# Patient Record
Sex: Male | Born: 1955 | Race: Black or African American | Hispanic: No | Marital: Single | Smoking: Former smoker
Health system: Southern US, Community
[De-identification: ages and names within clinical notes are randomized; demographics above are authoritative.]

## PROBLEM LIST (undated history)

## (undated) DIAGNOSIS — E162 Hypoglycemia, unspecified: Secondary | ICD-10-CM

## (undated) DIAGNOSIS — Z1635 Resistance to multiple antimicrobial drugs: Secondary | ICD-10-CM

## (undated) DIAGNOSIS — J45909 Unspecified asthma, uncomplicated: Secondary | ICD-10-CM

## (undated) DIAGNOSIS — K912 Postsurgical malabsorption, not elsewhere classified: Secondary | ICD-10-CM

## (undated) DIAGNOSIS — B377 Candidal sepsis: Secondary | ICD-10-CM

## (undated) DIAGNOSIS — G931 Anoxic brain damage, not elsewhere classified: Secondary | ICD-10-CM

## (undated) DIAGNOSIS — I509 Heart failure, unspecified: Secondary | ICD-10-CM

## (undated) DIAGNOSIS — K90829 Short bowel syndrome, unspecified: Secondary | ICD-10-CM

## (undated) DIAGNOSIS — J984 Other disorders of lung: Secondary | ICD-10-CM

## (undated) DIAGNOSIS — L89159 Pressure ulcer of sacral region, unspecified stage: Secondary | ICD-10-CM

## (undated) DIAGNOSIS — I469 Cardiac arrest, cause unspecified: Secondary | ICD-10-CM

## (undated) DIAGNOSIS — K5090 Crohn's disease, unspecified, without complications: Secondary | ICD-10-CM

## (undated) DIAGNOSIS — D649 Anemia, unspecified: Secondary | ICD-10-CM

## (undated) DIAGNOSIS — IMO0002 Reserved for concepts with insufficient information to code with codable children: Secondary | ICD-10-CM

## (undated) DIAGNOSIS — R410 Disorientation, unspecified: Secondary | ICD-10-CM

## (undated) DIAGNOSIS — E46 Unspecified protein-calorie malnutrition: Secondary | ICD-10-CM

## (undated) DIAGNOSIS — Z95810 Presence of automatic (implantable) cardiac defibrillator: Secondary | ICD-10-CM

## (undated) DIAGNOSIS — I82629 Acute embolism and thrombosis of deep veins of unspecified upper extremity: Secondary | ICD-10-CM

## (undated) DIAGNOSIS — G4733 Obstructive sleep apnea (adult) (pediatric): Secondary | ICD-10-CM

---

## 2009-04-11 DIAGNOSIS — G4733 Obstructive sleep apnea (adult) (pediatric): Secondary | ICD-10-CM | POA: Diagnosis present

## 2012-05-25 DIAGNOSIS — E538 Deficiency of other specified B group vitamins: Secondary | ICD-10-CM | POA: Insufficient documentation

## 2012-08-24 DIAGNOSIS — K912 Postsurgical malabsorption, not elsewhere classified: Secondary | ICD-10-CM | POA: Insufficient documentation

## 2012-08-24 DIAGNOSIS — K90829 Short bowel syndrome, unspecified: Secondary | ICD-10-CM | POA: Insufficient documentation

## 2013-06-17 DIAGNOSIS — I509 Heart failure, unspecified: Secondary | ICD-10-CM

## 2014-08-04 DIAGNOSIS — Z95810 Presence of automatic (implantable) cardiac defibrillator: Secondary | ICD-10-CM | POA: Insufficient documentation

## 2015-12-06 DIAGNOSIS — I428 Other cardiomyopathies: Secondary | ICD-10-CM | POA: Insufficient documentation

## 2016-05-17 DIAGNOSIS — D638 Anemia in other chronic diseases classified elsewhere: Secondary | ICD-10-CM | POA: Insufficient documentation

## 2017-02-15 DIAGNOSIS — J9622 Acute and chronic respiratory failure with hypercapnia: Secondary | ICD-10-CM | POA: Insufficient documentation

## 2017-02-18 DIAGNOSIS — B49 Unspecified mycosis: Secondary | ICD-10-CM | POA: Insufficient documentation

## 2017-04-14 DIAGNOSIS — I469 Cardiac arrest, cause unspecified: Secondary | ICD-10-CM | POA: Insufficient documentation

## 2017-04-14 DIAGNOSIS — D62 Acute posthemorrhagic anemia: Secondary | ICD-10-CM | POA: Insufficient documentation

## 2017-05-11 DIAGNOSIS — N179 Acute kidney failure, unspecified: Secondary | ICD-10-CM | POA: Diagnosis present

## 2017-05-22 ENCOUNTER — Inpatient Hospital Stay
Admission: RE | Admit: 2017-05-22 | Discharge: 2017-05-24 | Disposition: A | Discharge status: Hospice | Payer: Medicare Other | Source: Other Acute Inpatient Hospital | Attending: Internal Medicine | Admitting: Internal Medicine

## 2017-05-22 ENCOUNTER — Other Ambulatory Visit (HOSPITAL_COMMUNITY): Payer: Medicare Other

## 2017-05-22 DIAGNOSIS — Z0189 Encounter for other specified special examinations: Secondary | ICD-10-CM

## 2017-05-22 DIAGNOSIS — Z4659 Encounter for fitting and adjustment of other gastrointestinal appliance and device: Secondary | ICD-10-CM

## 2017-05-22 DIAGNOSIS — R0603 Acute respiratory distress: Secondary | ICD-10-CM

## 2017-05-22 DIAGNOSIS — J9690 Respiratory failure, unspecified, unspecified whether with hypoxia or hypercapnia: Secondary | ICD-10-CM

## 2017-05-22 MED ORDER — ONDANSETRON HCL 4 MG/2ML IJ SOLN
4.00 | INTRAMUSCULAR | Status: DC
Start: ? — End: 2017-05-22

## 2017-05-22 MED ORDER — HEPARIN SODIUM (PORCINE) 10000 UNIT/ML IJ SOLN
2500.00 | INTRAMUSCULAR | Status: DC
Start: 2017-05-22 — End: 2017-05-22

## 2017-05-22 MED ORDER — GENERIC EXTERNAL MEDICATION
Status: DC
Start: ? — End: 2017-05-22

## 2017-05-22 MED ORDER — SODIUM CHLORIDE 0.9 % IJ SOLN
10.00 | INTRAMUSCULAR | Status: DC
Start: 2017-05-23 — End: 2017-05-22

## 2017-05-22 MED ORDER — GENERIC EXTERNAL MEDICATION
500.00 | Status: DC
Start: 2017-05-22 — End: 2017-05-22

## 2017-05-22 MED ORDER — GENERIC EXTERNAL MEDICATION
.50 | Status: DC
Start: ? — End: 2017-05-22

## 2017-05-22 MED ORDER — GABAPENTIN 300 MG/6ML PO SOLN
300.00 | ORAL | Status: DC
Start: 2017-05-23 — End: 2017-05-22

## 2017-05-22 MED ORDER — HYDROMORPHONE HCL-NACL 25-0.9 MG/25ML-% IV SOSY
0.50 | PREFILLED_SYRINGE | INTRAVENOUS | Status: DC
Start: ? — End: 2017-05-22

## 2017-05-22 MED ORDER — GENERIC EXTERNAL MEDICATION
1.00 | Status: DC
Start: 2017-05-22 — End: 2017-05-22

## 2017-05-22 MED ORDER — CHLORHEXIDINE GLUCONATE 0.12 % MT SOLN
15.00 | OROMUCOSAL | Status: DC
Start: 2017-05-22 — End: 2017-05-22

## 2017-05-22 MED ORDER — GLUCAGON HCL RDNA (DIAGNOSTIC) 1 MG IJ SOLR
1.00 | INTRAMUSCULAR | Status: DC
Start: ? — End: 2017-05-22

## 2017-05-22 MED ORDER — HYDROMORPHONE HCL-NACL 30-0.9 MG/30ML-% IV SOSY
0.50 | PREFILLED_SYRINGE | INTRAVENOUS | Status: DC
Start: ? — End: 2017-05-22

## 2017-05-22 MED ORDER — FAMOTIDINE PREMIXED 20-0.9 MG/50ML-% IV SOLN
20.00 | INTRAVENOUS | Status: DC
Start: 2017-05-23 — End: 2017-05-22

## 2017-05-22 MED ORDER — QUETIAPINE FUMARATE 25 MG PO TABS
25.00 | ORAL_TABLET | ORAL | Status: DC
Start: 2017-05-22 — End: 2017-05-22

## 2017-05-22 MED ORDER — DEXTROSE 50 % IV SOLN
12.50 | INTRAVENOUS | Status: DC
Start: ? — End: 2017-05-22

## 2017-05-22 MED ORDER — LEVOTHYROXINE SODIUM 500 MCG IV SOLR
22.00 | INTRAVENOUS | Status: DC
Start: 2017-05-23 — End: 2017-05-22

## 2017-05-22 MED ORDER — SODIUM CHLORIDE 0.9 % IV SOLN
INTRAVENOUS | Status: DC
Start: ? — End: 2017-05-22

## 2017-05-22 MED ORDER — SODIUM CHLORIDE 0.9 % IJ SOLN
10.00 | INTRAMUSCULAR | Status: DC
Start: ? — End: 2017-05-22

## 2017-05-22 MED ORDER — DEXTROSE 10 % IV SOLN
INTRAVENOUS | Status: DC
Start: ? — End: 2017-05-22

## 2017-05-22 MED ORDER — BUPROPION HCL 75 MG PO TABS
75.00 | ORAL_TABLET | ORAL | Status: DC
Start: 2017-05-23 — End: 2017-05-22

## 2017-05-23 LAB — COMPREHENSIVE METABOLIC PANEL
ALBUMIN: 3 g/dL — AB (ref 3.5–5.0)
ALT: 43 U/L (ref 17–63)
ANION GAP: 12 (ref 5–15)
AST: 84 U/L — AB (ref 15–41)
Alkaline Phosphatase: 259 U/L — ABNORMAL HIGH (ref 38–126)
BILIRUBIN TOTAL: 1.5 mg/dL — AB (ref 0.3–1.2)
BUN: 36 mg/dL — ABNORMAL HIGH (ref 6–20)
CHLORIDE: 100 mmol/L — AB (ref 101–111)
CO2: 21 mmol/L — AB (ref 22–32)
Calcium: 10.4 mg/dL — ABNORMAL HIGH (ref 8.9–10.3)
Creatinine, Ser: 1.95 mg/dL — ABNORMAL HIGH (ref 0.61–1.24)
GFR calc Af Amer: 41 mL/min — ABNORMAL LOW (ref >60)
GFR calc non Af Amer: 36 mL/min — ABNORMAL LOW (ref >60)
GLUCOSE: 169 mg/dL — AB (ref 65–99)
POTASSIUM: 4.1 mmol/L (ref 3.5–5.1)
SODIUM: 133 mmol/L — AB (ref 135–145)
TOTAL PROTEIN: 8.7 g/dL — AB (ref 6.5–8.1)

## 2017-05-23 LAB — CBC
HEMATOCRIT: 31.5 % — AB (ref 39.0–52.0)
HEMOGLOBIN: 10.1 g/dL — AB (ref 13.0–17.0)
MCH: 27.2 pg (ref 26.0–34.0)
MCHC: 32.1 g/dL (ref 30.0–36.0)
MCV: 84.9 fL (ref 78.0–100.0)
Platelets: 707 10*3/uL — ABNORMAL HIGH (ref 150–400)
RBC: 3.71 MIL/uL — ABNORMAL LOW (ref 4.22–5.81)
RDW: 16.8 % — ABNORMAL HIGH (ref 11.5–15.5)
WBC: 13.4 10*3/uL — ABNORMAL HIGH (ref 4.0–10.5)

## 2017-05-23 LAB — C DIFFICILE QUICK SCREEN W PCR REFLEX
C Diff antigen: NEGATIVE
C Diff interpretation: NOT DETECTED
C Diff toxin: NEGATIVE

## 2017-05-23 LAB — MAGNESIUM: Magnesium: 1.8 mg/dL (ref 1.7–2.4)

## 2017-05-23 LAB — PHOSPHORUS: PHOSPHORUS: 2.6 mg/dL (ref 2.5–4.6)

## 2017-05-23 LAB — TRIGLYCERIDES: Triglycerides: 137 mg/dL (ref <150)

## 2017-05-24 ENCOUNTER — Inpatient Hospital Stay (HOSPITAL_COMMUNITY)
Admission: AD | Admit: 2017-05-24 | Discharge: 2017-05-29 | DRG: 870 | Disposition: A | Discharge status: Other Acute Inpatient Hospital | Payer: Medicare Other | Source: Other Acute Inpatient Hospital | Attending: Internal Medicine | Admitting: Internal Medicine

## 2017-05-24 ENCOUNTER — Inpatient Hospital Stay (HOSPITAL_COMMUNITY): Payer: Medicare Other

## 2017-05-24 ENCOUNTER — Encounter (HOSPITAL_COMMUNITY): Payer: Self-pay | Admitting: Critical Care Medicine

## 2017-05-24 ENCOUNTER — Other Ambulatory Visit (HOSPITAL_COMMUNITY): Payer: Medicare Other

## 2017-05-24 DIAGNOSIS — K567 Ileus, unspecified: Secondary | ICD-10-CM | POA: Diagnosis present

## 2017-05-24 DIAGNOSIS — I5043 Acute on chronic combined systolic (congestive) and diastolic (congestive) heart failure: Secondary | ICD-10-CM | POA: Diagnosis not present

## 2017-05-24 DIAGNOSIS — G4733 Obstructive sleep apnea (adult) (pediatric): Secondary | ICD-10-CM | POA: Diagnosis present

## 2017-05-24 DIAGNOSIS — J984 Other disorders of lung: Secondary | ICD-10-CM | POA: Diagnosis present

## 2017-05-24 DIAGNOSIS — Z681 Body mass index (BMI) 19 or less, adult: Secondary | ICD-10-CM | POA: Diagnosis not present

## 2017-05-24 DIAGNOSIS — A419 Sepsis, unspecified organism: Principal | ICD-10-CM

## 2017-05-24 DIAGNOSIS — R570 Cardiogenic shock: Secondary | ICD-10-CM | POA: Diagnosis present

## 2017-05-24 DIAGNOSIS — Z79890 Hormone replacement therapy (postmenopausal): Secondary | ICD-10-CM

## 2017-05-24 DIAGNOSIS — K5090 Crohn's disease, unspecified, without complications: Secondary | ICD-10-CM | POA: Diagnosis present

## 2017-05-24 DIAGNOSIS — Z515 Encounter for palliative care: Secondary | ICD-10-CM | POA: Diagnosis not present

## 2017-05-24 DIAGNOSIS — N390 Urinary tract infection, site not specified: Secondary | ICD-10-CM | POA: Diagnosis present

## 2017-05-24 DIAGNOSIS — Z9911 Dependence on respirator [ventilator] status: Secondary | ICD-10-CM

## 2017-05-24 DIAGNOSIS — N281 Cyst of kidney, acquired: Secondary | ICD-10-CM | POA: Diagnosis present

## 2017-05-24 DIAGNOSIS — Z86718 Personal history of other venous thrombosis and embolism: Secondary | ICD-10-CM

## 2017-05-24 DIAGNOSIS — N179 Acute kidney failure, unspecified: Secondary | ICD-10-CM

## 2017-05-24 DIAGNOSIS — K912 Postsurgical malabsorption, not elsewhere classified: Secondary | ICD-10-CM | POA: Diagnosis present

## 2017-05-24 DIAGNOSIS — L89154 Pressure ulcer of sacral region, stage 4: Secondary | ICD-10-CM | POA: Diagnosis present

## 2017-05-24 DIAGNOSIS — E872 Acidosis: Secondary | ICD-10-CM | POA: Diagnosis present

## 2017-05-24 DIAGNOSIS — Z888 Allergy status to other drugs, medicaments and biological substances status: Secondary | ICD-10-CM

## 2017-05-24 DIAGNOSIS — R680 Hypothermia, not associated with low environmental temperature: Secondary | ICD-10-CM | POA: Diagnosis not present

## 2017-05-24 DIAGNOSIS — I5022 Chronic systolic (congestive) heart failure: Secondary | ICD-10-CM | POA: Diagnosis present

## 2017-05-24 DIAGNOSIS — Z930 Tracheostomy status: Secondary | ICD-10-CM

## 2017-05-24 DIAGNOSIS — E876 Hypokalemia: Secondary | ICD-10-CM | POA: Diagnosis present

## 2017-05-24 DIAGNOSIS — J9601 Acute respiratory failure with hypoxia: Secondary | ICD-10-CM | POA: Diagnosis not present

## 2017-05-24 DIAGNOSIS — J9620 Acute and chronic respiratory failure, unspecified whether with hypoxia or hypercapnia: Secondary | ICD-10-CM

## 2017-05-24 DIAGNOSIS — R627 Adult failure to thrive: Secondary | ICD-10-CM

## 2017-05-24 DIAGNOSIS — I5030 Unspecified diastolic (congestive) heart failure: Secondary | ICD-10-CM | POA: Diagnosis not present

## 2017-05-24 DIAGNOSIS — E43 Unspecified severe protein-calorie malnutrition: Secondary | ICD-10-CM

## 2017-05-24 DIAGNOSIS — J9621 Acute and chronic respiratory failure with hypoxia: Secondary | ICD-10-CM | POA: Diagnosis present

## 2017-05-24 DIAGNOSIS — I255 Ischemic cardiomyopathy: Secondary | ICD-10-CM | POA: Diagnosis present

## 2017-05-24 DIAGNOSIS — E039 Hypothyroidism, unspecified: Secondary | ICD-10-CM | POA: Diagnosis present

## 2017-05-24 DIAGNOSIS — I959 Hypotension, unspecified: Secondary | ICD-10-CM | POA: Diagnosis not present

## 2017-05-24 DIAGNOSIS — I469 Cardiac arrest, cause unspecified: Secondary | ICD-10-CM | POA: Diagnosis present

## 2017-05-24 DIAGNOSIS — D638 Anemia in other chronic diseases classified elsewhere: Secondary | ICD-10-CM | POA: Diagnosis present

## 2017-05-24 DIAGNOSIS — Z7401 Bed confinement status: Secondary | ICD-10-CM

## 2017-05-24 DIAGNOSIS — Z885 Allergy status to narcotic agent status: Secondary | ICD-10-CM

## 2017-05-24 DIAGNOSIS — N170 Acute kidney failure with tubular necrosis: Secondary | ICD-10-CM | POA: Diagnosis present

## 2017-05-24 DIAGNOSIS — I110 Hypertensive heart disease with heart failure: Secondary | ICD-10-CM | POA: Diagnosis present

## 2017-05-24 DIAGNOSIS — Z881 Allergy status to other antibiotic agents status: Secondary | ICD-10-CM

## 2017-05-24 DIAGNOSIS — Z1624 Resistance to multiple antibiotics: Secondary | ICD-10-CM | POA: Diagnosis present

## 2017-05-24 DIAGNOSIS — Z7189 Other specified counseling: Secondary | ICD-10-CM

## 2017-05-24 DIAGNOSIS — Z880 Allergy status to penicillin: Secondary | ICD-10-CM

## 2017-05-24 DIAGNOSIS — R6521 Severe sepsis with septic shock: Secondary | ICD-10-CM | POA: Diagnosis present

## 2017-05-24 DIAGNOSIS — J9622 Acute and chronic respiratory failure with hypercapnia: Secondary | ICD-10-CM | POA: Diagnosis present

## 2017-05-24 DIAGNOSIS — R64 Cachexia: Secondary | ICD-10-CM | POA: Diagnosis present

## 2017-05-24 DIAGNOSIS — I509 Heart failure, unspecified: Secondary | ICD-10-CM | POA: Diagnosis not present

## 2017-05-24 DIAGNOSIS — Z22322 Carrier or suspected carrier of Methicillin resistant Staphylococcus aureus: Secondary | ICD-10-CM

## 2017-05-24 DIAGNOSIS — L8990 Pressure ulcer of unspecified site, unspecified stage: Secondary | ICD-10-CM

## 2017-05-24 DIAGNOSIS — Z95810 Presence of automatic (implantable) cardiac defibrillator: Secondary | ICD-10-CM

## 2017-05-24 DIAGNOSIS — Z8674 Personal history of sudden cardiac arrest: Secondary | ICD-10-CM

## 2017-05-24 DIAGNOSIS — Z9049 Acquired absence of other specified parts of digestive tract: Secondary | ICD-10-CM

## 2017-05-24 HISTORY — DX: Obstructive sleep apnea (adult) (pediatric): G47.33

## 2017-05-24 HISTORY — DX: Anoxic brain damage, not elsewhere classified: G93.1

## 2017-05-24 HISTORY — DX: Reserved for concepts with insufficient information to code with codable children: IMO0002

## 2017-05-24 HISTORY — DX: Unspecified asthma, uncomplicated: J45.909

## 2017-05-24 HISTORY — DX: Acute embolism and thrombosis of deep veins of unspecified upper extremity: I82.629

## 2017-05-24 HISTORY — DX: Cardiac arrest, cause unspecified: I46.9

## 2017-05-24 HISTORY — DX: Hypoglycemia, unspecified: E16.2

## 2017-05-24 HISTORY — DX: Candidal sepsis: B37.7

## 2017-05-24 HISTORY — DX: Short bowel syndrome, unspecified: K90.829

## 2017-05-24 HISTORY — DX: Unspecified protein-calorie malnutrition: E46

## 2017-05-24 HISTORY — DX: Disorientation, unspecified: R41.0

## 2017-05-24 HISTORY — DX: Resistance to multiple antimicrobial drugs: Z16.35

## 2017-05-24 HISTORY — DX: Postsurgical malabsorption, not elsewhere classified: K91.2

## 2017-05-24 HISTORY — DX: Anemia, unspecified: D64.9

## 2017-05-24 HISTORY — DX: Other disorders of lung: J98.4

## 2017-05-24 HISTORY — DX: Presence of automatic (implantable) cardiac defibrillator: Z95.810

## 2017-05-24 HISTORY — DX: Crohn's disease, unspecified, without complications: K50.90

## 2017-05-24 HISTORY — DX: Heart failure, unspecified: I50.9

## 2017-05-24 HISTORY — DX: Pressure ulcer of sacral region, unspecified stage: L89.159

## 2017-05-24 LAB — BLOOD GAS, ARTERIAL
ACID-BASE DEFICIT: 9.8 mmol/L — AB (ref 0.0–2.0)
Acid-base deficit: 17 mmol/L — ABNORMAL HIGH (ref 0.0–2.0)
Bicarbonate: 11.7 mmol/L — ABNORMAL LOW (ref 20.0–28.0)
Bicarbonate: 17.1 mmol/L — ABNORMAL LOW (ref 20.0–28.0)
FIO2: 1
FIO2: 40
LHR: 22 {breaths}/min
MECHVT: 300 mL
MECHVT: 400 mL
O2 SAT: 99.1 %
O2 SAT: 98.3 %
PATIENT TEMPERATURE: 98.6
PATIENT TEMPERATURE: 92
PCO2 ART: 46 mmHg (ref 32.0–48.0)
PEEP/CPAP: 5 cmH2O
PEEP: 5 cmH2O
PH ART: 7.033 — AB (ref 7.350–7.450)
PH ART: 7.227 — AB (ref 7.350–7.450)
PO2 ART: 141 mmHg — AB (ref 83.0–108.0)
RATE: 12 resp/min
pCO2 arterial: 40 mmHg (ref 32.0–48.0)
pO2, Arterial: 426 mmHg — ABNORMAL HIGH (ref 83.0–108.0)

## 2017-05-24 LAB — GLUCOSE, CAPILLARY
GLUCOSE-CAPILLARY: 154 mg/dL — AB (ref 65–99)
GLUCOSE-CAPILLARY: 211 mg/dL — AB (ref 65–99)
Glucose-Capillary: 112 mg/dL — ABNORMAL HIGH (ref 65–99)
Glucose-Capillary: 212 mg/dL — ABNORMAL HIGH (ref 65–99)
Glucose-Capillary: 215 mg/dL — ABNORMAL HIGH (ref 65–99)

## 2017-05-24 LAB — MRSA PCR SCREENING: MRSA by PCR: POSITIVE — AB

## 2017-05-24 LAB — POCT I-STAT 3, ART BLOOD GAS (G3+)
ACID-BASE DEFICIT: 7 mmol/L — AB (ref 0.0–2.0)
BICARBONATE: 21.2 mmol/L (ref 20.0–28.0)
O2 Saturation: 99 %
PH ART: 7.215 — AB (ref 7.350–7.450)
Patient temperature: 37.8
TCO2: 23 mmol/L (ref 22–32)
pCO2 arterial: 52.9 mmHg — ABNORMAL HIGH (ref 32.0–48.0)
pO2, Arterial: 146 mmHg — ABNORMAL HIGH (ref 83.0–108.0)

## 2017-05-24 LAB — MAGNESIUM: MAGNESIUM: 2.4 mg/dL (ref 1.7–2.4)

## 2017-05-24 LAB — ABO/RH: ABO/RH(D): B POS

## 2017-05-24 LAB — COMPREHENSIVE METABOLIC PANEL
ALBUMIN: 3 g/dL — AB (ref 3.5–5.0)
ALK PHOS: 283 U/L — AB (ref 38–126)
ALT: 63 U/L (ref 17–63)
AST: 128 U/L — ABNORMAL HIGH (ref 15–41)
Anion gap: 20 — ABNORMAL HIGH (ref 5–15)
BUN: 49 mg/dL — ABNORMAL HIGH (ref 6–20)
CO2: 11 mmol/L — AB (ref 22–32)
CREATININE: 2.79 mg/dL — AB (ref 0.61–1.24)
Calcium: 9.6 mg/dL (ref 8.9–10.3)
Chloride: 101 mmol/L (ref 101–111)
GFR calc non Af Amer: 23 mL/min — ABNORMAL LOW (ref >60)
GFR, EST AFRICAN AMERICAN: 27 mL/min — AB (ref >60)
GLUCOSE: 136 mg/dL — AB (ref 65–99)
Potassium: 4 mmol/L (ref 3.5–5.1)
SODIUM: 132 mmol/L — AB (ref 135–145)
Total Bilirubin: 1.3 mg/dL — ABNORMAL HIGH (ref 0.3–1.2)
Total Protein: 9 g/dL — ABNORMAL HIGH (ref 6.5–8.1)

## 2017-05-24 LAB — LACTIC ACID, PLASMA
LACTIC ACID, VENOUS: 6.2 mmol/L — AB (ref 0.5–1.9)
LACTIC ACID, VENOUS: 1.3 mmol/L (ref 0.5–1.9)
LACTIC ACID, VENOUS: 1.4 mmol/L (ref 0.5–1.9)

## 2017-05-24 LAB — CBC
HCT: 34.4 % — ABNORMAL LOW (ref 39.0–52.0)
Hemoglobin: 10.6 g/dL — ABNORMAL LOW (ref 13.0–17.0)
MCH: 27.1 pg (ref 26.0–34.0)
MCHC: 30.8 g/dL (ref 30.0–36.0)
MCV: 88 fL (ref 78.0–100.0)
PLATELETS: 665 10*3/uL — AB (ref 150–400)
RBC: 3.91 MIL/uL — AB (ref 4.22–5.81)
RDW: 17.3 % — ABNORMAL HIGH (ref 11.5–15.5)
WBC: 32 10*3/uL — ABNORMAL HIGH (ref 4.0–10.5)

## 2017-05-24 LAB — TYPE AND SCREEN
ABO/RH(D): B POS
ANTIBODY SCREEN: NEGATIVE

## 2017-05-24 LAB — URINALYSIS, ROUTINE W REFLEX MICROSCOPIC
Bilirubin Urine: NEGATIVE
Glucose, UA: 150 mg/dL — AB
Ketones, ur: 5 mg/dL — AB
NITRITE: NEGATIVE
Specific Gravity, Urine: 1.019 (ref 1.005–1.030)
pH: 5 (ref 5.0–8.0)

## 2017-05-24 LAB — CORTISOL: Cortisol, Plasma: 33.9 ug/dL

## 2017-05-24 LAB — TRIGLYCERIDES: Triglycerides: 87 mg/dL (ref <150)

## 2017-05-24 LAB — HIV ANTIBODY (ROUTINE TESTING W REFLEX): HIV Screen 4th Generation wRfx: NONREACTIVE

## 2017-05-24 LAB — CREATININE, URINE, RANDOM: Creatinine, Urine: 121.14 mg/dL

## 2017-05-24 LAB — PHOSPHORUS: Phosphorus: 9.8 mg/dL — ABNORMAL HIGH (ref 2.5–4.6)

## 2017-05-24 LAB — APTT: aPTT: 72 seconds — ABNORMAL HIGH (ref 24–36)

## 2017-05-24 LAB — PROTIME-INR
INR: 1.98
Prothrombin Time: 22.3 seconds — ABNORMAL HIGH (ref 11.4–15.2)

## 2017-05-24 LAB — SODIUM, URINE, RANDOM: SODIUM UR: 26 mmol/L

## 2017-05-24 LAB — PROCALCITONIN: Procalcitonin: 0.89 ng/mL

## 2017-05-24 LAB — TROPONIN I: Troponin I: <0.03 ng/mL (ref <0.03)

## 2017-05-24 MED ORDER — FENTANYL CITRATE (PF) 100 MCG/2ML IJ SOLN
50.0000 ug | INTRAMUSCULAR | Status: DC | PRN
  Administered 2017-05-25 – 2017-05-29 (×10): 50 ug via INTRAVENOUS
  Filled 2017-05-24 (×10): qty 2

## 2017-05-24 MED ORDER — FENTANYL CITRATE (PF) 100 MCG/2ML IJ SOLN: 50.0000 ug | INTRAMUSCULAR | Status: DC | PRN

## 2017-05-24 MED ORDER — STERILE WATER FOR INJECTION IV SOLN: INTRAVENOUS | Status: DC

## 2017-05-24 MED ORDER — SODIUM CHLORIDE 0.9 % IV BOLUS (SEPSIS)
500.0000 mL | Freq: Once | INTRAVENOUS | Status: AC
  Administered 2017-05-24: 500 mL via INTRAVENOUS

## 2017-05-24 MED ORDER — MIDAZOLAM HCL 2 MG/2ML IJ SOLN: 1.0000 mg | INTRAMUSCULAR | Status: DC | PRN

## 2017-05-24 MED ORDER — CHLORHEXIDINE GLUCONATE CLOTH 2 % EX PADS
6.0000 | MEDICATED_PAD | Freq: Every day | CUTANEOUS | Status: DC
  Administered 2017-05-26 – 2017-05-29 (×4): 6 via TOPICAL

## 2017-05-24 MED ORDER — NOREPINEPHRINE BITARTRATE 1 MG/ML IV SOLN
0.0000 ug/min | INTRAVENOUS | Status: DC
  Administered 2017-05-24: 33 ug/min via INTRAVENOUS
  Administered 2017-05-24: 40 ug/min via INTRAVENOUS
  Administered 2017-05-25: 12 ug/min via INTRAVENOUS
  Administered 2017-05-25: 25 ug/min via INTRAVENOUS
  Filled 2017-05-24 (×7): qty 16

## 2017-05-24 MED ORDER — NOREPINEPHRINE BITARTRATE 1 MG/ML IV SOLN
0.0000 ug/min | INTRAVENOUS | Status: DC
  Administered 2017-05-24 (×2): 40 ug/min via INTRAVENOUS
  Administered 2017-05-24: 35 ug/min via INTRAVENOUS
  Filled 2017-05-24 (×3): qty 4

## 2017-05-24 MED ORDER — SODIUM CHLORIDE 0.9 % IV SOLN
500.0000 mg | Freq: Two times a day (BID) | INTRAVENOUS | Status: DC
  Administered 2017-05-24 – 2017-05-27 (×8): 500 mg via INTRAVENOUS
  Filled 2017-05-24 (×12): qty 0.5

## 2017-05-24 MED ORDER — HEPARIN SODIUM (PORCINE) 5000 UNIT/ML IJ SOLN
5000.0000 [IU] | Freq: Three times a day (TID) | INTRAMUSCULAR | Status: DC
  Administered 2017-05-24 – 2017-05-29 (×17): 5000 [IU] via SUBCUTANEOUS
  Filled 2017-05-24 (×18): qty 1

## 2017-05-24 MED ORDER — FAMOTIDINE 40 MG/5ML PO SUSR
20.0000 mg | Freq: Two times a day (BID) | ORAL | Status: DC
  Administered 2017-05-24: 20 mg
  Filled 2017-05-24: qty 2.5

## 2017-05-24 MED ORDER — INSULIN ASPART 100 UNIT/ML ~~LOC~~ SOLN
1.0000 [IU] | SUBCUTANEOUS | Status: DC
  Administered 2017-05-24 (×3): 2 [IU] via SUBCUTANEOUS
  Administered 2017-05-24: 3 [IU] via SUBCUTANEOUS
  Administered 2017-05-25 (×3): 2 [IU] via SUBCUTANEOUS
  Administered 2017-05-25 – 2017-05-28 (×3): 1 [IU] via SUBCUTANEOUS

## 2017-05-24 MED ORDER — SODIUM CHLORIDE 0.9 % IV SOLN
200.0000 mg | Freq: Once | INTRAVENOUS | Status: AC
  Administered 2017-05-24: 200 mg via INTRAVENOUS
  Filled 2017-05-24: qty 200

## 2017-05-24 MED ORDER — MUPIROCIN 2 % EX OINT
1.0000 "application " | TOPICAL_OINTMENT | Freq: Two times a day (BID) | CUTANEOUS | Status: AC
  Administered 2017-05-24 – 2017-05-28 (×10): 1 via NASAL
  Filled 2017-05-24: qty 22

## 2017-05-24 MED ORDER — SODIUM CHLORIDE 0.9 % IV SOLN
250.0000 mL | INTRAVENOUS | Status: DC | PRN
  Administered 2017-05-29: 10 mL via INTRAVENOUS

## 2017-05-24 MED ORDER — SODIUM BICARBONATE 8.4 % IV SOLN
100.0000 meq | Freq: Once | INTRAVENOUS | Status: AC
  Administered 2017-05-24: 100 meq via INTRAVENOUS
  Filled 2017-05-24: qty 100

## 2017-05-24 MED ORDER — SODIUM BICARBONATE 8.4 % IV SOLN
INTRAVENOUS | Status: DC
  Administered 2017-05-24 – 2017-05-25 (×4): via INTRAVENOUS
  Filled 2017-05-24 (×5): qty 150

## 2017-05-24 MED ORDER — SODIUM CHLORIDE 0.9 % IV BOLUS
1000.0000 mL | Freq: Once | INTRAVENOUS | Status: AC
  Administered 2017-05-24: 1000 mL via INTRAVENOUS

## 2017-05-24 MED ORDER — ORAL CARE MOUTH RINSE
15.0000 mL | Freq: Four times a day (QID) | OROMUCOSAL | Status: DC
  Administered 2017-05-24 – 2017-05-29 (×21): 15 mL via OROMUCOSAL

## 2017-05-24 MED ORDER — FAMOTIDINE 40 MG/5ML PO SUSR
20.0000 mg | Freq: Every day | ORAL | Status: DC
  Administered 2017-05-25 – 2017-05-28 (×4): 20 mg
  Filled 2017-05-24 (×5): qty 2.5

## 2017-05-24 MED ORDER — PANTOPRAZOLE SODIUM 40 MG IV SOLR: 40.0000 mg | Freq: Every day | INTRAVENOUS | Status: DC

## 2017-05-24 MED ORDER — IPRATROPIUM-ALBUTEROL 0.5-2.5 (3) MG/3ML IN SOLN
3.0000 mL | Freq: Four times a day (QID) | RESPIRATORY_TRACT | Status: DC
  Administered 2017-05-24 – 2017-05-26 (×9): 3 mL via RESPIRATORY_TRACT
  Filled 2017-05-24 (×10): qty 3

## 2017-05-24 MED ORDER — CHLORHEXIDINE GLUCONATE 0.12% ORAL RINSE (MEDLINE KIT)
15.0000 mL | Freq: Two times a day (BID) | OROMUCOSAL | Status: DC
  Administered 2017-05-24 – 2017-05-29 (×11): 15 mL via OROMUCOSAL

## 2017-05-24 MED ORDER — SODIUM CHLORIDE 0.9 % IV SOLN
100.0000 mg | INTRAVENOUS | Status: DC
  Administered 2017-05-25 – 2017-05-29 (×5): 100 mg via INTRAVENOUS
  Filled 2017-05-24 (×5): qty 100

## 2017-05-24 MED ORDER — VANCOMYCIN HCL IN DEXTROSE 1-5 GM/200ML-% IV SOLN
1000.0000 mg | Freq: Once | INTRAVENOUS | Status: AC
  Administered 2017-05-24: 1000 mg via INTRAVENOUS
  Filled 2017-05-24: qty 200

## 2017-05-24 MED FILL — Sodium Chloride IV Soln 0.9%: INTRAVENOUS | Qty: 1000 | Status: AC

## 2017-05-24 NOTE — Consult Note (Signed)
Consultation Note Date: 05/24/2017   Patient Name: Melvin Kelley  DOB: Dec 03, 1955  MRN: [id number]  Age / Sex: 62 y.o., male  PCP: Cleotis Nipper, MD Referring Physician: Reyne Dumas, MD  Reason for Consultation: Establishing goals of care and Psychosocial/spiritual support  HPI/Patient Profile: 62 y.o. 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  Recently transitioned from [facility] to [facility].  On 4/7 patient found pulseless at [facility]. Started CPR and given 1 EPI with return of ROSC. Transferred to [facility] management.  Continued physical, functional, and cognitive decline over the past many months.  Prognosis is grim for meaningful recovery.  Family face ongoing treatment option decisions, advanced directive decisions and anticipatory care needs.   Clinical Assessment and Goals of Care:  This NP Wadie Lessen reviewed medical records, received report from team, assessed the patient and then meet with the patient's family to include mother, sister/Maryln Nyoka Cowden and her husband Richardson Landry, brother Yohan Samons, daughter Darrelle Wiberg and patient's ex-wife/Bonita   to discuss diagnosis, prognosis, GOC, EOL wishes disposition and options.  Concept of  Palliative Care was discussed  This is a complicated situation.  Per the family the patient has had medical complexity since he was a young man 2/2 to his Crohn's disease.  They describe a significant continued physical and functional decline over the last year.   Patient has been in and out of hospitals and LTAC's over the last many  months.  They tell me about hospital stays at [facility],  [place] and [facility] in [place].  Apparently the patient was transitioned from [facility] in [place] to [place] this past Friday secondary to dissatisfaction with care according to family.  This situation is complicated in that there is no documented healthcare power of attorney.    Up until this point the patient's mother and his 2 siblings Leda Gauze and Richardson Landry have been the main decision makers.  They have opted always for full aggressive care secondary to the fact that the patient had  communicated to them that he wanted "everything done to prolong life at all cost".  It is now complicated by the fact that the patient has 4 children who wish to weigh in on decisions for this patient.  It is unsure at this time how majority feel about level/aggressivenes of care.   A detailed discussion was had today regarding advanced directives.  Concepts specific to code status, artifical feeding and hydration, continued IV antibiotics and rehospitalization was had.  The difference between a aggressive medical intervention path  and a palliative comfort care path for this patient at this time was had.  Values and goals of care important to patient and family were attempted to be elicited.  We discussed the concept of mortality and the limitations of medical interventions available to  offer any opportunity for meaningful recovery.  Discussed high risk for decompensation  Questions and concerns addressed.     Meeting is scheduled for tomorrow at 1100.  Mother and patient's children will be present to clarify GOCs  PMT will continue to support holistically.  Family encouraged to call with questions or concerns.   SUMMARY OF RECOMMENDATIONS    Code Status/Advance Care Planning:  Full code  Additional Recommendations (Limitations, Scope, Preferences):  Full Scope Treatment  Psycho-social/Spiritual:   Desire for further Chaplaincy support:yes  Additional  Recommendations: Grief/Bereavement Support  Prognosis:   Unable to determine, dependent on desire for life prolonging measures  Discharge Planning: To Be Determined      Primary Diagnoses: Present on Admission: . Cardiac arrest ([place])   I have reviewed the medical record, interviewed the patient and family, and examined the patient. The following aspects are pertinent.  Past Medical History:  Diagnosis Date  . AICD (automatic cardioverter/defibrillator) present   . Anemia   . Anoxic encephalopathy ([place])   . Asthma   . Candidemia ([place])   . Cardiac arrest [facility])    Multiple cardiac arrests at [facility] 05/11/17, 03/2017  . CHF (congestive heart failure) (H[facility])    EF 30%  . Crohn disease ([place])   . Delirium   . DVT of upper extremity (deep vein thrombosis) ([place])   . Hypoglycemia   . Malnutrition ([place])    on long term TPN  . MDRO (multiple drug resistant organisms) resistance    Klebsiella and Citrobacter in Urine  . OSA (obstructive sleep apnea)   . Restrictive lung disease   . Sacral decubitus ulcer   . Short gut syndrome    Social History   Socioeconomic History  . Marital status: Single    Spouse name: Not on file  . Number of children: Not on file  . Years of education: Not on file  . Highest education level: Not on file  Occupational History  . Not on file  Social Needs  . Financial resource strain: Not on file  . Food insecurity:    Worry: Not on file    Inability: Not on file  . Transportation needs:    Medical: Not on file    Non-medical: Not on file  Tobacco Use  . Smoking status: Not on file  Substance and Sexual Activity  . Alcohol use: Not on file  . Drug use: Not on file  . Sexual activity: Not on file  Lifestyle  . Physical activity:    Days per week: Not on file    Minutes per session: Not on file  . Stress: Not on file  Relationships  . Social connections:    Talks on phone: Not on file    Gets together: Not on file    Attends religious  service: Not on file    Active member of club or organization: Not on file    Attends meetings of clubs or organizations: Not on file    Relationship status: Not on file  Other Topics Concern  . Not on file  Social History Narrative  . Not on file   No family history on file. Scheduled Meds: . chlorhexidine gluconate (MEDLINE KIT)  15 mL Mouth Rinse BID  . [START ON 05/25/2017] famotidine  20 mg Per Tube QHS  . heparin  5,000 Units Subcutaneous Q8H  . insulin aspart  1-3 Units Subcutaneous Q4H  . ipratropium-albuterol  3 mL Nebulization Q6H  . mouth rinse  15  mL Mouth Rinse QID   Continuous Infusions: . sodium chloride    . [START ON 05/25/2017] anidulafungin    . meropenem (MERREM) IV    . norepinephrine (LEVOPHED) Adult infusion 40 mcg/min (05/24/17 1109)  .  sodium bicarbonate  infusion 1000 mL 125 mL/hr at 05/24/17 0622   PRN Meds:.sodium chloride, fentaNYL (SUBLIMAZE) injection, fentaNYL (SUBLIMAZE) injection, midazolam, midazolam Medications Prior to Admission:  Prior to Admission medications   Not on File   Allergies  Allergen Reactions  . Morphine And Related Shortness Of Breath  . Glipizide Itching  . Levofloxacin Hives  . Penicillins Hives  . Sulfa Antibiotics Hives  . Trazodone And Nefazodone Itching  . Codeine Rash   Review of Systems  Unable to perform ROS: Intubated    Physical Exam  Constitutional: He appears cachectic. He appears ill. He is intubated.  Cardiovascular: Normal rate, regular rhythm and normal heart sounds.  Pulmonary/Chest: He is intubated.  Musculoskeletal:  -Contractures bilateral extremities, gross muscle atrophy  Skin: Skin is warm and dry.    Vital Signs: BP (!) 60/46   Pulse 60   Temp 98 F (36.7 C) (Oral)   Resp 20   Ht 5' 6"  (1.676 m)   Wt 46 kg (101 lb 6.6 oz)   SpO2 100%   BMI 16.37 kg/m  Pain Scale: CPOT       SpO2: SpO2: 100 % O2 Device:SpO2: 100 % O2 Flow Rate: .   IO: Intake/output summary:    Intake/Output Summary (Last 24 hours) at 05/24/2017 1150 Last data filed at 05/24/2017 1000 Gross per 24 hour  Intake 919.5 ml  Output 10 ml  Net 909.5 ml    LBM:   Baseline Weight: Weight: 46 kg (101 lb 6.6 oz) Most recent weight: Weight: 46 kg (101 lb 6.6 oz)      Palliative Assessment/Data:   Discussed with David/bedside RN  Time In: 1230 Time Out: 1345 Time Total: 75 min Greater than 50%  of this time was spent counseling and coordinating care related to the above assessment and plan.  Signed by: Wadie Lessen, NP   Please contact Palliative Medicine Team phone at [phone] for questions and concerns.  For individual provider: See Shea Evans

## 2017-05-24 NOTE — Progress Notes (Addendum)
PCCM interval note  ABG reviewed 7.22/53/146/99% Labs significant for lactic acid 6.2 > 1.3 pro calcitonin 0.9, troponin less than 0.03, cortisol 34  - Respiratory rate on vent increased to 28 - Continue antibiotics - Wean off Levophed as tolerated  Chilton Greathouse MD [facility] Pager [phone] If no answer or after 3pm call: [phone] 05/24/2017, 2:12 PM

## 2017-05-24 NOTE — Progress Notes (Addendum)
Pharmacy Antibiotic Note  Zidan Mackowiak is a 62 y.o. male admitted on 05/24/2017 with sepsis after coding at [facility].  Pharmacy has been consulted for Vancocin, Merrem, and Eraxis dosing.  Pt had been on Merrem at [facility] (last dose at [facility]).  Plan: Vancomycin 1g IV x1 and monitor SCr prior to redosing (inc'd sharply over last 12hr 1.95>2.79).  Goal trough 15-20 mcg/mL. Merrem 500mg  IV Q12H (was previously dose at [facility]). Eraxis 200mg  x1 then 100mg  IV Q24H.  Height: 5\' 6"  (167.6 cm) Weight: 101 lb 6.6 oz (46 kg) IBW/kg (Calculated) : 63.8  Recent Labs  Lab 05/23/17 1344 05/24/17 0435  WBC 13.4* 32.0*  CREATININE 1.95* 2.79*  LATICACIDVEN  --  6.2*    Estimated Creatinine Clearance: 18.3 mL/min (A) (by C-G formula based on SCr of 2.79 mg/dL (H)).    Allergies  Allergen Reactions  . Morphine And Related Shortness Of Breath  . Glipizide Itching  . Levofloxacin Hives  . Penicillins Hives  . Sulfa Antibiotics Hives  . Trazodone And Nefazodone Itching  . Codeine Rash     Thank you for allowing pharmacy to be a part of this patient's care.  Vernard Gambles, PharmD, BCPS  05/24/2017 5:45 AM

## 2017-05-24 NOTE — Code Documentation (Signed)
  Patient Name: Melvin Kelley   MRN: [id number]   Date of Birth/ Sex: 12/06/1955 , male      Admission Date: 05/24/2017  Attending Provider: Gigi Gin, MD  Primary Diagnosis: <principal problem not specified>   Indication: Pt was in his usual state of health until this PM, when he was noted to be unresponsive and in PEA arrest. Code blue was subsequently called. At the time of arrival on scene, ACLS protocol was underway.   Technical Description:  - CPR performance duration:  2 minutes  - Was defibrillation or cardioversion used? No   - Was external pacer placed? No  - Was patient intubated pre/post CPR? Yes   Medications Administered: Y = Yes; Blank = No Amiodarone    Atropine  Y  Calcium    Epinephrine  Y  Lidocaine    Magnesium    Norepinephrine    Phenylephrine    Sodium bicarbonate    Vasopressin     Post CPR evaluation:  - Final Status - Was patient successfully resuscitated ? Yes - What is current rhythm? Sinus bradycardia - What is current hemodynamic status? Stable  Miscellaneous Information:  - Labs sent, including: Per PCCM  - Primary team notified?  Yes  - Family Notified? Unsure  - Additional notes/ transfer status: Transferred to ICU     Scherrie Gerlach, MD  05/24/2017, 6:46 AM

## 2017-05-24 NOTE — H&P (Signed)
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 Intubated 12/14. Extubated 12/18. Discharged Home.  Admitted at [facility] 12/29-1/11 with AMS, Hypercapnia, and Fungemia with Candida Parapsilosis  Admitted at [facility] 2/26-3/22 s/p Cardiac Arrest in setting of sepsis, respiratory failure  Admitted at [facility] 3/25-4/5 for hypercapnic/hypoxic induced asystolic cardiac arrest with PCO2 >100  On 4/7 patient found pulseless at [facility]. Started CPR and given 1 EPI with return of ROSC. Transferred to [facility] For further management.   ABG 7.033/46/426. Crt 2.79. WBC 32.0. LA 6.2   PAST MEDICAL HISTORY :  He  has no past medical history on file.  PAST SURGICAL HISTORY: He  has no past surgical history on file.  Allergies not on file  No current facility-administered medications on file prior to encounter.    No current outpatient medications on file prior to encounter.    FAMILY HISTORY:  His has no family status information on file.    SOCIAL HISTORY: He    REVIEW OF SYSTEMS:   Unable to review as patient is vented/sedated   SUBJECTIVE:   VITAL SIGNS: There were no vitals taken for this visit.  HEMODYNAMICS: PAP: ()/()   VENTILATOR SETTINGS:    INTAKE / OUTPUT: No intake/output data recorded.  PHYSICAL EXAMINATION: General:  Chronically ill adult male, on vent  Neuro:  Pupils  intact, does not follow commands, withdrawals from pain, +gag  HEENT:  Trach in place > clean, dry  Cardiovascular:  RRR, no MRG  Lungs:  Clear breath sounds, no crackles/wheeze  Abdomen:  Non-distended, active bowel sounds  Musculoskeletal:  -edema  Skin:  Warm, dry, stage IV sacral wound   LABS:  BMET Recent Labs  Lab 05/23/17 1344  NA 133*  K 4.1  CL 100*  CO2 21*  BUN 36*  CREATININE 1.95*  GLUCOSE 169*    Electrolytes Recent Labs  Lab 05/22/17 2333 05/23/17 1344  CALCIUM  --  10.4*  MG 1.8  --   PHOS 2.6  --     CBC Recent Labs  Lab 05/23/17 1344 05/24/17 0435  WBC 13.4* 32.0*  HGB 10.1* 10.6*  HCT 31.5* 34.4*  PLT 707* 665*    Coag's No results for input(s): APTT, INR in the last 168 hours.  Sepsis Markers No results for input(s): LATICACIDVEN, PROCALCITON, O2SATVEN in the last 168 hours.  ABG Recent Labs  Lab 05/24/17 0435  PHART 7.033*  PCO2ART 46.0  PO2ART 426*    Liver Enzymes Recent Labs  Lab 05/23/17 1344  AST 84*  ALT 43  ALKPHOS 259*  BILITOT 1.5*  ALBUMIN 3.0*    Cardiac Enzymes No results for input(s): TROPONINI, PROBNP in the last 168 hours.  Glucose No results for input(s): GLUCAP in the last 168 hours.  Imaging Dg Chest Port 1 View  Result Date: 05/24/2017 CLINICAL DATA:  62 y/o  M; acute respiratory distress. EXAM: PORTABLE CHEST 1 VIEW COMPARISON:  05/22/2017 chest radiograph FINDINGS: Stable normal  cardiac silhouette given projection and technique. 2 lead AICD. Tracheostomy tube noted. Enteric tube tip projects over the gastric body. Right central venous catheter tip projects over the mid SVC. Aeration of the right lung. No new consolidation. Multiple surgical clips project over the abdomen and there are transcutaneous pacing pads. Partially visualized distention of large bowel in upper abdomen. Bones are unremarkable. IMPRESSION: Improved aeration of the right lung. No new consolidation identified. Stable lines and  tubes. Persistent partially visualized distention of bowel. Electronically Signed   By: Mitzi Hansen M.D.   On: 05/24/2017 04:47     STUDIES:  CXR 4/7 > Improved aeration of the right lung. No new consolidation identified. Stable lines and tubes. Persistent partially visualized distention of bowel. ECHO 4/7 >>   CULTURES: Blood 4/7 >> Sputum 4/7 >. U/A 4/7 >>  ANTIBIOTICS: Vancomycin 4/7 >> Meropenem 4/7 >> Eraxis 4/7 >>   SIGNIFICANT EVENTS: 4/7 > Presents from [facility] s/p Cardiac Arrest   LINES/TUBES: PICC  Trach   DISCUSSION: 62 year old male with recent hospitalization for acute on chronic hypercarbic/hypoxic respiratory failure with multiple cardiac arrests. Discharged to [facility] on 4/5. 4/7 found pulseless, started CPR and given 1 EPI with return of ROSC.   ASSESSMENT / PLAN:  PULMONARY A: Acute on Chronic Hypoxic/Hypercarbic Respiratory Failure s/p Tracheostomy/Vent Dependent  H/O OSA, Restrictive lung disease P:   Vent Support  ABG/CXR VAP Bundle  Scheduled Duoneb  Pulmonary Hygiene   CARDIOVASCULAR A:  Cardiac Arrest (Presumed Asystole/PEA)  Presumed Septic Shock with unknown etiology > with stage IV sacral wound, recent fungemia Chronic Systolic Heart Failure (EF 30-35) s/p ICD  H/O DVT  -Cardiac Arrest 2/26, 3/25, 4/7  P:  Cardiac Monitoring Wean Levophed to Maintain MAP > 65 ECHO pending  EKG Trend Troponin   RENAL A:   Anion Gap Metabolic Acidosis  Lactic Acidosis in setting of hypoperfusion vs sepsis  LA 6.2 >>  Acute Kidney Injury s/p 1.5 L NS  P:   Trend BMP Trend LA  Replace electrolytes as indicated  Given 100 meq Bicarb > Started on Bicarb at 125 ml/hr   GASTROINTESTINAL A:   Severe Protein Calorie Malnutrition  H/O Crohn's  -Colon Resection with Ileostomy 2013 -Ileostomy Reversal/TakeDown 2013 P:   NPO Pepcid   HEMATOLOGIC A:   Anemia of Chronic Disease  -H/O DVT, did not tolerate  anticoagulation due to anemia - recent U/S with no DVT   P:  Trend CBC  Maintain Hbg >7   INFECTIOUS A:  Presumed Septic Shock with unknown etiology > with stage IV sacral wound, recent fungemia H/O Osteomyelitis, Recent Treatment with Candida Parapsilosis Fungemia (suspected due to PICC)  -Treated with Fluconazole (1/2-1/14)  P:   Trend Fever and WBC curve Trend LA and PCT PAN culture  Vancomycin, Meropenem, Eraxis   ENDOCRINE A:   DM > Documented Hypoglycemia events  Hypothyroidism    P:   Trend Glucose  SSI   NEUROLOGIC A:   Metabolic Encephalopathy vs Anoxia  > Waking up more since arrival to unit  P:   RASS goal: 0/-1 PRN Versed/Fentanyl    FAMILY  - Updates: Awaiting Family arrival to unit. Per Care Everywhere, patient has multiple family members who are having a hard time agreeing on code status - Ethics was involved at OSH   - Inter-disciplinary family meet or Palliative Care meeting due by: 05/31/2017. Palliative care consulted     Jovita Kussmaul, AGACNP-BC [facility]  Pgr: [phone]  PCCM Pgr: [phone]

## 2017-05-25 ENCOUNTER — Inpatient Hospital Stay (HOSPITAL_COMMUNITY): Payer: Medicare Other

## 2017-05-25 DIAGNOSIS — Z515 Encounter for palliative care: Secondary | ICD-10-CM

## 2017-05-25 DIAGNOSIS — R627 Adult failure to thrive: Secondary | ICD-10-CM

## 2017-05-25 DIAGNOSIS — Z7189 Other specified counseling: Secondary | ICD-10-CM

## 2017-05-25 DIAGNOSIS — J9620 Acute and chronic respiratory failure, unspecified whether with hypoxia or hypercapnia: Secondary | ICD-10-CM

## 2017-05-25 DIAGNOSIS — N179 Acute kidney failure, unspecified: Secondary | ICD-10-CM

## 2017-05-25 DIAGNOSIS — I5030 Unspecified diastolic (congestive) heart failure: Secondary | ICD-10-CM

## 2017-05-25 LAB — BASIC METABOLIC PANEL
ANION GAP: 14 (ref 5–15)
Anion gap: 18 — ABNORMAL HIGH (ref 5–15)
BUN: 51 mg/dL — AB (ref 6–20)
BUN: 54 mg/dL — ABNORMAL HIGH (ref 6–20)
CO2: 27 mmol/L (ref 22–32)
CO2: 27 mmol/L (ref 22–32)
CREATININE: 2.47 mg/dL — AB (ref 0.61–1.24)
CREATININE: 2.71 mg/dL — AB (ref 0.61–1.24)
Calcium: 7.3 mg/dL — ABNORMAL LOW (ref 8.9–10.3)
Calcium: 8.1 mg/dL — ABNORMAL LOW (ref 8.9–10.3)
Chloride: 90 mmol/L — ABNORMAL LOW (ref 101–111)
Chloride: 89 mmol/L — ABNORMAL LOW (ref 101–111)
GFR calc Af Amer: 31 mL/min — ABNORMAL LOW (ref >60)
GFR calc non Af Amer: 27 mL/min — ABNORMAL LOW (ref >60)
GFR calc non Af Amer: 24 mL/min — ABNORMAL LOW (ref >60)
GFR, EST AFRICAN AMERICAN: 28 mL/min — AB (ref >60)
GLUCOSE: 217 mg/dL — AB (ref 65–99)
Glucose, Bld: 102 mg/dL — ABNORMAL HIGH (ref 65–99)
Potassium: 2.9 mmol/L — ABNORMAL LOW (ref 3.5–5.1)
Potassium: 3.9 mmol/L (ref 3.5–5.1)
Sodium: 131 mmol/L — ABNORMAL LOW (ref 135–145)
Sodium: 134 mmol/L — ABNORMAL LOW (ref 135–145)

## 2017-05-25 LAB — BLOOD GAS, ARTERIAL
Acid-Base Excess: 6.7 mmol/L — ABNORMAL HIGH (ref 0.0–2.0)
Bicarbonate: 29.9 mmol/L — ABNORMAL HIGH (ref 20.0–28.0)
DRAWN BY: 100061
FIO2: 40
LHR: 28 {breaths}/min
MECHVT: 510 mL
O2 Saturation: 97.8 %
PATIENT TEMPERATURE: 98
PCO2 ART: 35.8 mmHg (ref 32.0–48.0)
PEEP: 5 cmH2O
PO2 ART: 93.9 mmHg (ref 83.0–108.0)
pH, Arterial: 7.53 — ABNORMAL HIGH (ref 7.350–7.450)

## 2017-05-25 LAB — CBC
HCT: 26.8 % — ABNORMAL LOW (ref 39.0–52.0)
HEMOGLOBIN: 9.4 g/dL — AB (ref 13.0–17.0)
MCH: 28.2 pg (ref 26.0–34.0)
MCHC: 35.1 g/dL (ref 30.0–36.0)
MCV: 80.5 fL (ref 78.0–100.0)
Platelets: 481 10*3/uL — ABNORMAL HIGH (ref 150–400)
RBC: 3.33 MIL/uL — ABNORMAL LOW (ref 4.22–5.81)
RDW: 17.1 % — AB (ref 11.5–15.5)
WBC: 21.4 10*3/uL — ABNORMAL HIGH (ref 4.0–10.5)

## 2017-05-25 LAB — GLUCOSE, CAPILLARY
GLUCOSE-CAPILLARY: 109 mg/dL — AB (ref 65–99)
GLUCOSE-CAPILLARY: 148 mg/dL — AB (ref 65–99)
GLUCOSE-CAPILLARY: 177 mg/dL — AB (ref 65–99)
GLUCOSE-CAPILLARY: 56 mg/dL — AB (ref 65–99)
GLUCOSE-CAPILLARY: 90 mg/dL (ref 65–99)
Glucose-Capillary: 104 mg/dL — ABNORMAL HIGH (ref 65–99)
Glucose-Capillary: 114 mg/dL — ABNORMAL HIGH (ref 65–99)
Glucose-Capillary: 170 mg/dL — ABNORMAL HIGH (ref 65–99)

## 2017-05-25 LAB — MAGNESIUM: MAGNESIUM: 1.4 mg/dL — AB (ref 1.7–2.4)

## 2017-05-25 LAB — ECHOCARDIOGRAM COMPLETE
HEIGHTINCHES: 66 in
Weight: 1869.5 oz

## 2017-05-25 LAB — VANCOMYCIN, RANDOM: Vancomycin Rm: 15

## 2017-05-25 LAB — PROCALCITONIN: Procalcitonin: 12.37 ng/mL

## 2017-05-25 LAB — PHOSPHORUS: Phosphorus: 2.9 mg/dL (ref 2.5–4.6)

## 2017-05-25 MED ORDER — LEVOTHYROXINE SODIUM 88 MCG PO TABS
44.0000 ug | ORAL_TABLET | Freq: Every day | ORAL | Status: DC
  Administered 2017-05-25 – 2017-05-29 (×5): 44 ug
  Filled 2017-05-25 (×5): qty 0.5

## 2017-05-25 MED ORDER — VANCOMYCIN HCL IN DEXTROSE 750-5 MG/150ML-% IV SOLN
750.0000 mg | Freq: Once | INTRAVENOUS | Status: AC
  Administered 2017-05-25: 750 mg via INTRAVENOUS
  Filled 2017-05-25: qty 150

## 2017-05-25 MED ORDER — LACTATED RINGERS IV SOLN
INTRAVENOUS | Status: AC
  Administered 2017-05-25 – 2017-05-26 (×2): via INTRAVENOUS

## 2017-05-25 MED ORDER — DEXTROSE 50 % IV SOLN
INTRAVENOUS | Status: AC
  Administered 2017-05-25: 25 mL via INTRAVENOUS
  Filled 2017-05-25: qty 50

## 2017-05-25 MED ORDER — POTASSIUM CHLORIDE 20 MEQ/15ML (10%) PO SOLN
40.0000 meq | Freq: Once | ORAL | Status: AC
  Administered 2017-05-25: 40 meq
  Filled 2017-05-25: qty 30

## 2017-05-25 MED ORDER — MAGNESIUM SULFATE 2 GM/50ML IV SOLN
2.0000 g | Freq: Once | INTRAVENOUS | Status: AC
  Administered 2017-05-25: 2 g via INTRAVENOUS
  Filled 2017-05-25: qty 50

## 2017-05-25 MED ORDER — DEXTROSE 50 % IV SOLN: 25.0000 mL | Freq: Once | INTRAVENOUS | Status: DC

## 2017-05-25 MED ORDER — POTASSIUM CHLORIDE 10 MEQ/100ML IV SOLN
10.0000 meq | INTRAVENOUS | Status: AC
  Administered 2017-05-25 (×2): 10 meq via INTRAVENOUS
  Filled 2017-05-25 (×2): qty 100

## 2017-05-25 MED ORDER — POTASSIUM CHLORIDE 20 MEQ/15ML (10%) PO SOLN
20.0000 meq | Freq: Once | ORAL | Status: AC
  Administered 2017-05-25: 20 meq
  Filled 2017-05-25: qty 15

## 2017-05-25 MED ORDER — VANCOMYCIN HCL IN DEXTROSE 750-5 MG/150ML-% IV SOLN
750.0000 mg | INTRAVENOUS | Status: DC
  Administered 2017-05-26: 750 mg via INTRAVENOUS
  Filled 2017-05-25 (×2): qty 150

## 2017-05-25 MED ORDER — DEXTROSE 50 % IV SOLN
25.0000 mL | Freq: Once | INTRAVENOUS | Status: AC
  Administered 2017-05-25: 25 mL via INTRAVENOUS
  Filled 2017-05-25: qty 50

## 2017-05-25 MED FILL — Medication: Qty: 2 | Status: AC

## 2017-05-25 NOTE — Consult Note (Signed)
WOC Nurse wound consult note Reason for Consult: PI to sacrum Wound type: Pressure Injury stage IV Pressure Injury POA: Yes Measurement: 2.5 cm x 4.5 cm x 0.2 cm; 100% pink, clean, no odor, serosanginous drainage on existing dressing. Topical therapy:  Apply xeroform gauze into sacral wound bed.  Cover with foam dressing. Change daily. Thank you for the consult.  Discussed plan of care with the patient and bedside nurse.  WOC nurse will not follow at this time.  Please re-consult the WOC team if needed.  Helmut Muster, RN, MSN, CWOCN, CNS-BC, pager [phone]

## 2017-05-25 NOTE — Progress Notes (Signed)
Initial Nutrition Assessment  DOCUMENTATION CODES:   Severe malnutrition in context of chronic illness, Underweight  INTERVENTION:   Resume TPN per Pharmacy as able  NUTRITION DIAGNOSIS:   Severe Malnutrition related to chronic illness(short gut syndrome) as evidenced by severe muscle depletion, severe fat depletion.  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  Discussed patient in ICU rounds and with RN today.  Palliative Care Team following for discussions regarding goals of care. Remains a full code at this time.  Plans to hold off on resuming TPN today, may be able to resume it tomorrow.   Patient is currently intubated on ventilator support MV: 13 L/min Temp (24hrs), Avg:97.3 F (36.3 C), Min:96.4 F (35.8 C), Max:98.4 F (36.9 C)  Propofol: none  Labs reviewed. Sodium 134 (L) CBG's: 114-90-170 Medications reviewed and include KCl.    NUTRITION - FOCUSED PHYSICAL EXAM:    Most Recent Value  Orbital Region  Severe depletion  Upper Arm Region  Severe depletion  Thoracic and Lumbar Region  Unable to assess  Buccal Region  Severe depletion  Temple Region  Severe depletion  Clavicle Bone Region  Severe depletion  Clavicle and Acromion Bone Region  Severe depletion  Scapular Bone Region  Unable to assess  Dorsal Hand  Unable to assess  Patellar Region  Severe depletion  Anterior Thigh Region  Severe depletion  Posterior Calf Region  Severe depletion  Edema (RD Assessment)  None  Hair  Reviewed  Eyes  Unable to assess  Mouth  Unable to  assess  Skin  Reviewed  Nails  Unable to assess       Diet Order:  Diet NPO time specified TPN ADULT  EDUCATION NEEDS:   No education needs have been identified at this time  Skin:  Skin Assessment: Skin Integrity Issues: Skin Integrity Issues:: Stage IV Stage IV: sacrum  Last BM:  unknown  Height:   Ht Readings from Last 1 Encounters:  05/24/17 5\' 6"  (1.676 m)    Weight:   Wt Readings from Last 1 Encounters:  05/25/17 116 lb 13.5 oz (53 kg)   Admission weight 4/7: 101 lb 6.6 oz (46 kg)  Ideal Body Weight:  64.5 kg  BMI:  Body mass index is 18.86 kg/m.  Estimated Nutritional Needs:   Kcal:  1550  Protein:  80-90 gm  Fluid:  1.6 L    Joaquin Courts, RD, LDN, CNSC Pager [phone] After Hours Pager [phone]

## 2017-05-25 NOTE — Progress Notes (Signed)
  Echocardiogram 2D Echocardiogram has been performed.  [name] 05/25/2017, 10:04 AM

## 2017-05-25 NOTE — Progress Notes (Signed)
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 Intubated 12/14. Extubated 12/18. Discharged Home.  Admitted at [facility] 12/29-1/11 with AMS, Hypercapnia, and Fungemia with Candida Parapsilosis  Admitted at [facility] 2/26-3/22 s/p Cardiac Arrest in setting of sepsis, respiratory failure  Admitted at [facility] 3/25-4/5 for hypercapnic/hypoxic induced asystolic cardiac arrest with PCO2 >100  On 4/7 patient found pulseless at [facility]. Started CPR and given 1 EPI with return of ROSC. Transferred to [facility] For further management. ABG 7.033/46/426. Crt 2.79. WBC 32.0. LA 6.2   SUBJECTIVE:  Awake, opening eyes, nods head  VITAL SIGNS: BP (!) 101/57   Pulse 77   Temp (!) 96.5 F (35.8 C) (Axillary)   Resp (!) 25   Ht 5\' 6"  (1.676 m)   Wt 116 lb 13.5 oz (53 kg)   SpO2 100%   BMI 18.86 kg/m   HEMODYNAMICS:    VENTILATOR SETTINGS: Vent Mode: PRVC FiO2 (%):  [40 %] 40 % Set Rate:  [22 bmp-28 bmp] 28 bmp Vt Set:  [510 mL] 510 mL PEEP:  [5 cmH20] 5 cmH20 Plateau Pressure:  [19 cmH20-28 cmH20] 26 cmH20  INTAKE / OUTPUT: I/O last 3 completed shifts: In: 4831.6 [I.V.:4501.6; IV Piggyback:330] Out: 1185 [Urine:335; Emesis/NG output:650; Stool:200]  PHYSICAL EXAMINATION: General:  Chronically ill adult male Neuro: awake, opens eyes, nods head HEENT:  Trach in place > clean, dry  Cardiovascular:  RRR, no  MRG  Lungs:  Clear breath sounds anteriorly, no crackles/wheeze  Abdomen:  Non-distended, active bowel sounds  Musculoskeletal: thin extremities, no edema Skin:  Warm, dry, stage IV sacral wound   LABS:  BMET Recent Labs  Lab 05/23/17 1344 05/24/17 0435 05/25/17 0449  NA 133* 132* 131*  K 4.1 4.0 2.9*  CL 100* 101 90*  CO2 21* 11* 27  BUN 36* 49* 51*  CREATININE 1.95* 2.79* 2.47*  GLUCOSE 169* 136* 217*    Electrolytes Recent Labs  Lab 05/22/17 2333 05/23/17 1344 05/24/17 0435 05/25/17 0449  CALCIUM  --  10.4* 9.6 7.3*  MG 1.8  --  2.4 1.4*  PHOS 2.6  --  9.8* 2.9    CBC Recent Labs  Lab 05/23/17 1344 05/24/17 0435 05/25/17 0449  WBC 13.4* 32.0* 21.4*  HGB 10.1* 10.6* 9.4*  HCT 31.5* 34.4* 26.8*  PLT 707* 665* 481*    Coag's Recent Labs  Lab 05/24/17 0642  APTT 72*  INR 1.98    Sepsis Markers Recent Labs  Lab 05/24/17 0435 05/24/17 0642 05/24/17 1719 05/25/17 0449  LATICACIDVEN 6.2* 1.3 1.4  --   PROCALCITON  --  0.89  --  12.37    ABG Recent Labs  Lab 05/24/17 0635 05/24/17 0853 05/25/17 0430  PHART 7.227* 7.215* 7.530*  PCO2ART 40.0 52.9* 35.8  PO2ART 141* 146.0* 93.9    Liver Enzymes Recent Labs  Lab 05/23/17 1344 05/24/17 0435  AST 84* 128*  ALT 43 63  ALKPHOS 259* 283*  BILITOT 1.5* 1.3*  ALBUMIN 3.0*  3.0*    Cardiac Enzymes Recent Labs  Lab 05/24/17 0642  TROPONINI <0.03    Glucose Recent Labs  Lab 05/24/17 1148 05/24/17 1625 05/24/17 1947 05/24/17 2354 05/25/17 0419 05/25/17 0726  GLUCAP 211* 212* 215* 177* 114* 90    Imaging US Renal  Result Date: 05/24/2017 CLINICAL DATA:  62 year old male with acute kidney injury EXAM: RENAL / URINARY TRACT ULTRASOUND COMPLETE COMPARISON:  None. FINDINGS: Right Kidney: Length: 8.7 cm. Parenchymal echogenicity is mildly elevated. There is a focal hyperechoic focus in the upper pole measuring 8 mm likely representing a cortical calcification. Small anechoic cystic  structure in the upper pole measures 1.1 x 1.0 x 1.4 cm. This likely represents a simple cyst. Left Kidney: Length: 10.3 cm. Mildly increased parenchymal echogenicity. No mass or hydronephrosis visualized. Bladder: Collapsed bladder with Foley catheter in place. IMPRESSION: 1. No evidence of hydronephrosis. 2. Mildly echogenic renal parenchyma bilaterally suggests underlying medical renal disease. 3. Small parenchymal cortical calcification at the upper pole of the right kidney. 4. Probable 1.4 cm right renal cyst. Electronically Signed   By: Malachy Moan M.D.   On: 05/24/2017 09:19   Portable Chest Xray  Result Date: 05/25/2017 CLINICAL DATA:  Acute respiratory failure with hypoxia EXAM: PORTABLE CHEST 1 VIEW COMPARISON:  05/24/2017. FINDINGS: Stable support tubes and lines. Normal heart size. Platelike atelectasis at the RIGHT base with mild BILATERAL airspace densities, stable. Gaseous distention. IMPRESSION: Stable exam. Electronically Signed   By: Elsie Stain M.D.   On: 05/25/2017 07:19     STUDIES:  CXR 4/7 > Improved aeration of the right lung. No new consolidation identified. Stable lines and tubes. Persistent partially visualized distention of bowel. ECHO 4/8 >>   CULTURES: Blood 4/7 >> Sputum 4/7 >. U/A 4/7 >> neg nitrites, trace leuks, rare bacteria Tracheal aspirate 4/7 >> squams, gram + cocci pairs, gram - coccobacilli  ANTIBIOTICS: Vancomycin 4/7 >> Meropenem 4/7 >> Eraxis 4/7 >>   SIGNIFICANT EVENTS: 4/7 > Presents from [facility] s/p Cardiac Arrest   LINES/TUBES: PICC RUE Trach   DISCUSSION: 62 year old male with recent hospitalization for acute on chronic hypercarbic/hypoxic respiratory failure with multiple cardiac arrests. Discharged to [facility] on 4/5. 4/7 found pulseless, started CPR and given 1 EPI with return of ROSC. Transferred to [facility] ICU.  ASSESSMENT / PLAN:  PULMONARY A: Acute on Chronic Hypoxic/Hypercarbic Respiratory Failure s/p  Tracheostomy/Vent Dependent  H/O OSA, Restrictive lung disease P:   Vent Support  ABG/CXR VAP Bundle  Scheduled Duoneb  Pulmonary Hygiene   CARDIOVASCULAR A:  Cardiac Arrest (Presumed Asystole/PEA)  Presumed Septic Shock with unknown etiology > with stage IV sacral wound, recent fungemia Chronic Systolic Heart Failure (EF 30-35) s/p ICD  H/O DVT  Cardiac Arrest 2/26, 3/25, 4/7  P:  Cardiac Monitoring Wean Levophed to Maintain MAP > 65 ECHO pending  Troponin <0.03  RENAL A:   Anion Gap Metabolic Acidosis  Lactic Acidosis in setting of hypoperfusion vs sepsis  LA 6.2 >> 1.3, 1.4 Acute Kidney Injury s/p 1.5 L NS  Renal U/S w/o hydronephrosis Hypokalemia - 2.9, Mg 1.4 P:   Trend BMP/UOP/I&Os Replace electrolytes as indicated - repleting K and Mg D/c bicarb  GASTROINTESTINAL A:   Severe Protein Calorie Malnutrition  H/O Crohn's  -Colon Resection with Ileostomy 2013 -Ileostomy Reversal/TakeDown 2013 P:   NPO Pepcid   HEMATOLOGIC A:   Anemia of Chronic Disease  -H/O DVT, did not tolerate anticoagulation due to anemia - recent U/S with no  DVT   P:  Trend CBC  Maintain Hbg >7   INFECTIOUS A:  Presumed Septic Shock with unknown etiology > with stage IV sacral wound, recent fungemia H/O Osteomyelitis, Recent Treatment with Candida Parapsilosis Fungemia (suspected due to PICC)  -Treated with Fluconazole (1/2-1/14)  P:   Trend Fever and WBC curve Vancomycin, Meropenem, Eraxis  F/u cultures  ENDOCRINE A:   DM > Documented Hypoglycemia events  Hypothyroidism    P:   Trend Glucose  SSI  Restart home Levothyroxine 44 mcg daily per tube  NEUROLOGIC A:   Metabolic Encephalopathy vs Anoxia  P:   RASS goal: 0/-1 PRN Versed/Fentanyl    FAMILY  - Updates: Awaiting Family arrival to unit. Per Care Everywhere, patient has multiple family members who are having a hard time agreeing on code status - Ethics was involved at OSH   - Palliative Care meeting  scheduled for 4/8 @ 11:00 am.  Darreld Mclean, MD Internal Medicine PGY-3

## 2017-05-25 NOTE — Progress Notes (Addendum)
Pharmacy Antibiotic Note  Melvin Kelley is a 62 y.o. male admitted on 05/24/2017 with sepsis after coding at [facility]. Pharmacy has been consulted for vancomycin, Merrem, and Eraxis dosing.  Assessment: Pt admitted from [facility] following PEA cardiac arrest. Scr improving 2.79 > 2.47, urine output not improving, WBC improved 32 > 21.4, temp 96.5, procalcitonin elevated but in setting of AKI, lactic acid trending down.    Based on AKI, urine output, and random vanc level of 15 after a single 1000mg  dose, vacomycin 750mg  was given today and will continue Q36H based on kinetics.  Patient started Merrem 4/5 at [facility], 500mg  IV Q12H given AKI (previously Q6H at [facility]). Received Eraxis 200mg  x1, then 100mg  IV Q24H.  Plan: Vancomycin 750 IV every 36 hours.  Goal trough 15-20 mcg/mL.  Vanc level prior to fourth dose Continue Merrem 500mg  IV Q12H Continue Eraxis 100mg  IV Q24H Will continue to monitor renal function and dose adjust if needed Will continue to follow up culture results  Height: 5\' 6"  (167.6 cm) Weight: 116 lb 13.5 oz (53 kg) IBW/kg (Calculated) : 63.8  Temp (24hrs), Avg:97.4 F (36.3 C), Min:96.5 F (35.8 C), Max:98.4 F (36.9 C)  Recent Labs  Lab 05/23/17 1344 05/24/17 0435 05/24/17 0642 05/24/17 1719 05/25/17 0449 05/25/17 1030  WBC 13.4* 32.0*  --   --  21.4*  --   CREATININE 1.95* 2.79*  --   --  2.47* 2.71*  LATICACIDVEN  --  6.2* 1.3 1.4  --   --   VANCORANDOM  --   --   --   --  15  --     Estimated Creatinine Clearance: 21.7 mL/min (A) (by C-G formula based on SCr of 2.71 mg/dL (H)).    Allergies  Allergen Reactions  . Morphine And Related Shortness Of Breath  . Glipizide Itching  . Levofloxacin Hives  . Penicillins Hives  . Sulfa Antibiotics Hives  . Trazodone And Nefazodone Itching  . Codeine Rash    Antimicrobials this admission: Vancomycin (4/7) >>  Merrem (4/5) >> Eraxis (4/7) >>  Dose adjustments this admission: Vancomycin 1000mg  x1 (4/7) >> 750mg  x1  (4/8) >> Merrem 500mg  Q12H (4/7) >> Eraxis 200mg  x1 (4/7) >> 100mg  IV Q24H (4/8) >>  Microbiology results: 4/7 BCx: pending 4/7 BCx Fungal: pending 4/7 UCx: negative nitrites, trace leuks, rare bacteria  4/7 Trach aspir: squams, gram pos cocci in pairs, gram neg coccobacilli - reincubated for better growth 4/7 MRSA PCR: positive 4/6 C. Diff: negative  Thank you for allowing pharmacy to be a part of this patient's care.  Josiah Lobo, PharmD Candidate 05/25/2017 2:39 PM   I discussed / reviewed the pharmacy note by Josiah Lobo, PharmD Candidate and I agree with the student's findings and plans as documented.  Link Snuffer, PharmD, BCPS, BCCCP Clinical Pharmacist Clinical phone 05/25/2017 until 3:[phone] After hours, please call [phone] 05/25/2017, 3:24 PM

## 2017-05-25 NOTE — Progress Notes (Signed)
Daily Progress Note   Patient Name: Melvin Kelley       Date: 05/25/2017 DOB: 30-Apr-1955  Age: 62 y.o. MRN#: [id number] Attending Physician: Brand Males, MD Primary Care Physician: Cleotis Nipper, MD Admit Date: 05/24/2017  Reason for Consultation/Follow-up: Establishing goals of care  Subjective: Patient intubated but attempts to answer when I ask if he is in pain. He indicates yes.  He attempts to track me with his eyes.  Met with Mother (Ola), brother Research scientist (medical)), and 4 children (Mali is a Oncologist son) and 3 adopted dtrs Occupational hygienist and 2 others who declined giving me their names).  There is no wife.  The majority of reasonably available parents and adult children make the decision. He has his mother and 4 adult children.  Agreement with 3 of the 5 qualifies as a decision.  The boundaries are clearly drawn.  The patient's mother believes in miracles and believes her son will be healed.  The patient's son (and brother Sabino Gasser) believe that as long as he can communicate with family he wants to continue to pursue aggressive care.  Two of the patient's 3 daughters are adamant that their father would not want to be this way "he is rotting from the inside out".  The 3rd daughter is on the fence - torn between two sides of the family attempting to make peace with both.  The three daughters passionately believe their father would not want to be this way and state that he has conveyed that to them.  The mother, brother and son state that he has told them as long as he is not "brain dead" pursue all medical interventions.  The family conversation broke down into members yelling at each other and running out of the room.    I attempted to discuss changing code status to DNR (but continuing current treatment)  with both sides of the family.  Two of the three daughters want to consider code status change if he does not improve.  The mother, brother, and son give me a resounding "full code".  The 3rd daughter sides with her grand mother, uncle and brother.  PMT will continue to meet with the family to provide support and education.   Assessment: 62 yo male who has been on "life support" numerous times (beginning 2 years ago).  Trach dependent -  currently on the vent.  No longer on pressors, was receiving TPN at night - prior to admission.  Contracted.  Bedbound.  Stage 4 sacral decub.   Patient Profile/HPI:  62 y.o. 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  Recently transitioned from [employer] to [employer].  On 4/7 patient found pulseless at [facility]. Started CPR and given 1 EPI with return of ROSC. Transferred to [facility] For further management.   Length of Stay: 1  Current Medications: Scheduled Meds:  . chlorhexidine gluconate (MEDLINE KIT)  15 mL Mouth Rinse BID  . Chlorhexidine Gluconate Cloth  6 each Topical Q0600  . famotidine  20 mg Per Tube QHS  . heparin  5,000 Units Subcutaneous Q8H  . insulin aspart  1-3 Units Subcutaneous Q4H  . ipratropium-albuterol  3 mL Nebulization Q6H  . levothyroxine  44 mcg Per Tube QAC breakfast  . mouth rinse  15 mL Mouth Rinse QID  . mupirocin ointment  1 application Nasal BID  . potassium chloride  20 mEq Per Tube Once    Continuous Infusions: . sodium chloride 10 mL/hr at 05/25/17 1400  . anidulafungin Stopped (05/25/17 0925)  . lactated ringers 75 mL/hr at 05/25/17 1400  . meropenem (MERREM) IV Stopped (05/25/17 1050)  . norepinephrine  (LEVOPHED) Adult infusion 15 mcg/min (05/25/17 1400)    PRN Meds: sodium chloride, fentaNYL (SUBLIMAZE) injection, fentaNYL (SUBLIMAZE) injection, midazolam, midazolam  Physical Exam        Thin, frail man.  Trached on vent.  Tracks me with his eyes.  Grunts when I ask if he is in pain. CV rrr resp no distress Extremities - UE are contracted  Sacral area not examined.  Vital Signs: BP 118/64   Pulse 75   Temp (!) 96.5 F (35.8 C) (Axillary)   Resp (!) 25   Ht 5' 6" (1.676 m)   Wt 53 kg (116 lb 13.5 oz)   SpO2 99%   BMI 18.86 kg/m  SpO2: SpO2: 99 % O2 Device: O2 Device: Ventilator O2 Flow Rate:    Intake/output summary:   Intake/Output Summary (Last 24 hours) at 05/25/2017 1502 Last data filed at 05/25/2017 1400 Gross per 24 hour  Intake 4041.95 ml  Output 685 ml  Net 3356.95 ml   LBM:   Baseline Weight: Weight: 46 kg (101 lb 6.6 oz) Most recent weight: Weight: 53 kg (116 lb 13.5 oz)       Palliative Assessment/Data:  20%    Flowsheet Rows     Most Recent Value  Intake Tab  Referral Department  Critical care  Unit at Time of Referral  ICU  Palliative Care Primary Diagnosis  Cardiac  Date Notified  05/24/17  Palliative Care Type  New Palliative care  Reason for referral  Clarify Goals of Care  Date of Admission  05/24/17  Date first seen by Palliative Care  05/24/17  # of days Palliative referral response time  0 Day(s)  # of days IP prior to Palliative referral  0  Clinical Assessment  Psychosocial & Spiritual Assessment  Palliative Care Outcomes      Patient Active Problem List   Diagnosis Date Noted  . AKI (acute kidney injury) ([place])   . Cardiac arrest ([place]) 05/24/2017  . Acute on  chronic respiratory failure ([place])   . Adult failure to thrive   . Palliative care by specialist   . DNR (do not resuscitate) discussion     Palliative Care Plan    Recommendations/Plan:  Continue current care.  Family will accept all interventions  offered.  Family is spit into two factions - neither has opted for code status change or change in management at this point.  PMT will continue to follow.  Goals of Care and Additional Recommendations:  Limitations on Scope of Treatment: Full Scope Treatment  Code Status:  Full code  Prognosis:   Unable to determine at extremely high risk for decompensation and recurrent cardiac arrest at any time.   Discharge Planning:  Anticipated Hospital Death vs LTAC  Care plan was discussed with bedside RN  Thank you for allowing the Palliative Medicine Team to assist in the care of this patient.  Total time spent:  120 min. Time in 1100 Time out 1300     Greater than 50%  of this time was spent counseling and coordinating care related to the above assessment and plan.  Florentina Jenny, PA-C Palliative Medicine  Please contact Palliative MedicineTeam phone at [phone] for questions and concerns between 7 am - 7 pm.   Please see AMION for individual provider pager numbers.

## 2017-05-25 NOTE — Progress Notes (Signed)
EEG completed, results pending. 

## 2017-05-25 NOTE — Procedures (Signed)
ELECTROENCEPHALOGRAM REPORT  Date of Study: 05/25/2017  Patient's Name: Melvin Kelley MRN: [id number] Date of Birth: [birth year]  Referring Provider: Dr. Milana Obey  Clinical History: This is a 63 year old man s/p cardiac arrest  Medications: Eraxis Synthroid  Technical Summary: A multichannel digital EEG recording measured by the international 10-20 system with electrodes applied with paste and impedances below 5000 ohms performed as portable with EKG monitoring in an awake and minimally responsive patient.  Hyperventilation and photic stimulation were not performed.  The digital EEG was referentially recorded, reformatted, and digitally filtered in a variety of bipolar and referential montages for optimal display.   Description: The patient is awake and minimally responsive during the recording. There is no clear posterior dominant rhythm. There is diffuse suppression of background activity with occasional low voltage diffuse 2-3 Hz delta slowing seen. Normal sleep architecture was not seen. There is increase in muscle artifact with stimulation. Hyperventilation and photic stimulation were not performed. He is reported to have chewing movements with no associated epileptiform correlate. There were no epileptiform discharges or electrographic seizures seen.    EKG lead was unremarkable.  Impression: This EEG is abnormal due to the presence of diffuse background suppression and slowing.  Clinical Correlation of the above findings indicates diffuse cerebral dysfunction that is non-specific in etiology and can be seen with hypoxic/ischemic injury, toxic/metabolic encephalopathies, neurodegenerative disorders, or medication effect.  The absence of epileptiform discharges does not rule out a clinical diagnosis of epilepsy.  Clinical correlation is advised.   Patrcia Dolly, M.D.

## 2017-05-25 NOTE — Progress Notes (Signed)
Hypoglycemic episode, CBG 56.  1/2 amp D50 given.  Follow up CBG 109.    Dr. Evelene Croon notified, no new orders at this time.

## 2017-05-26 ENCOUNTER — Inpatient Hospital Stay (HOSPITAL_COMMUNITY): Payer: Medicare Other

## 2017-05-26 LAB — BASIC METABOLIC PANEL
ANION GAP: 15 (ref 5–15)
BUN: 54 mg/dL — ABNORMAL HIGH (ref 6–20)
CALCIUM: 8.1 mg/dL — AB (ref 8.9–10.3)
CO2: 28 mmol/L (ref 22–32)
Chloride: 90 mmol/L — ABNORMAL LOW (ref 101–111)
Creatinine, Ser: 3.15 mg/dL — ABNORMAL HIGH (ref 0.61–1.24)
GFR calc Af Amer: 23 mL/min — ABNORMAL LOW (ref >60)
GFR, EST NON AFRICAN AMERICAN: 20 mL/min — AB (ref >60)
Glucose, Bld: 107 mg/dL — ABNORMAL HIGH (ref 65–99)
POTASSIUM: 3.3 mmol/L — AB (ref 3.5–5.1)
Sodium: 133 mmol/L — ABNORMAL LOW (ref 135–145)

## 2017-05-26 LAB — BLOOD GAS, ARTERIAL
ACID-BASE EXCESS: 7.5 mmol/L — AB (ref 0.0–2.0)
Bicarbonate: 30.6 mmol/L — ABNORMAL HIGH (ref 20.0–28.0)
Drawn by: 511551
FIO2: 0.4
LHR: 28 {breaths}/min
MECHVT: 510 mL
O2 SAT: 98.7 %
PEEP/CPAP: 5 cmH2O
PH ART: 7.534 — AB (ref 7.350–7.450)
Patient temperature: 98.6
pCO2 arterial: 36.4 mmHg (ref 32.0–48.0)
pO2, Arterial: 119 mmHg — ABNORMAL HIGH (ref 83.0–108.0)

## 2017-05-26 LAB — GLUCOSE, CAPILLARY
GLUCOSE-CAPILLARY: 107 mg/dL — AB (ref 65–99)
GLUCOSE-CAPILLARY: 83 mg/dL (ref 65–99)
Glucose-Capillary: 109 mg/dL — ABNORMAL HIGH (ref 65–99)
Glucose-Capillary: 131 mg/dL — ABNORMAL HIGH (ref 65–99)
Glucose-Capillary: 80 mg/dL (ref 65–99)
Glucose-Capillary: 95 mg/dL (ref 65–99)

## 2017-05-26 LAB — CBC
HCT: 26.8 % — ABNORMAL LOW (ref 39.0–52.0)
Hemoglobin: 8.9 g/dL — ABNORMAL LOW (ref 13.0–17.0)
MCH: 26.6 pg (ref 26.0–34.0)
MCHC: 33.2 g/dL (ref 30.0–36.0)
MCV: 80 fL (ref 78.0–100.0)
PLATELETS: 422 10*3/uL — AB (ref 150–400)
RBC: 3.35 MIL/uL — AB (ref 4.22–5.81)
RDW: 17.3 % — AB (ref 11.5–15.5)
WBC: 16.2 10*3/uL — AB (ref 4.0–10.5)

## 2017-05-26 LAB — PHOSPHORUS: Phosphorus: 2.8 mg/dL (ref 2.5–4.6)

## 2017-05-26 LAB — POCT I-STAT 3, ART BLOOD GAS (G3+)
Acid-Base Excess: 6 mmol/L — ABNORMAL HIGH (ref 0.0–2.0)
BICARBONATE: 30.3 mmol/L — AB (ref 20.0–28.0)
O2 Saturation: 99 %
PCO2 ART: 38.6 mmHg (ref 32.0–48.0)
TCO2: 32 mmol/L (ref 22–32)
pH, Arterial: 7.501 — ABNORMAL HIGH (ref 7.350–7.450)
pO2, Arterial: 117 mmHg — ABNORMAL HIGH (ref 83.0–108.0)

## 2017-05-26 LAB — PROCALCITONIN: PROCALCITONIN: 7.59 ng/mL

## 2017-05-26 LAB — MAGNESIUM: MAGNESIUM: 2 mg/dL (ref 1.7–2.4)

## 2017-05-26 MED ORDER — IPRATROPIUM-ALBUTEROL 0.5-2.5 (3) MG/3ML IN SOLN: 3.0000 mL | Freq: Four times a day (QID) | RESPIRATORY_TRACT | Status: DC | PRN

## 2017-05-26 MED ORDER — POTASSIUM CHLORIDE 10 MEQ/100ML IV SOLN
10.0000 meq | INTRAVENOUS | Status: AC
  Administered 2017-05-26 (×2): 10 meq via INTRAVENOUS
  Filled 2017-05-26: qty 100

## 2017-05-26 MED ORDER — LACTATED RINGERS IV SOLN
INTRAVENOUS | Status: DC
  Administered 2017-05-26 – 2017-05-27 (×3): via INTRAVENOUS

## 2017-05-26 MED ORDER — POTASSIUM CHLORIDE 20 MEQ/15ML (10%) PO SOLN
40.0000 meq | Freq: Once | ORAL | Status: AC
  Administered 2017-05-26: 40 meq
  Filled 2017-05-26: qty 30

## 2017-05-26 MED ORDER — LACTATED RINGERS IV BOLUS
500.0000 mL | Freq: Once | INTRAVENOUS | Status: AC
  Administered 2017-05-26: 500 mL via INTRAVENOUS

## 2017-05-26 NOTE — Progress Notes (Signed)
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 Intubated 12/14. Extubated 12/18. Discharged Home.  Admitted at [facility] 12/29-1/11 with AMS, Hypercapnia, and Fungemia with Candida Parapsilosis  Admitted at [facility] 2/26-3/22 s/p Cardiac Arrest in setting of sepsis, respiratory failure  Admitted at [facility] 3/25-4/5 for hypercapnic/hypoxic induced asystolic cardiac arrest with PCO2 >100  On 4/7 patient found pulseless at [facility]. Started CPR and given 1 EPI with return of ROSC. Transferred to [facility] For further management. ABG 7.033/46/426. Crt 2.79. WBC 32.0. LA 6.2   STUDIES:  CXR 4/7 > Improved aeration of the right lung. No new consolidation identified. Stable lines and tubes. Persistent partially visualized distention of bowel. ECHO 4/8 >> EF 45-50%, limited study EEG 4/8 >> diffuse background suppression and slowing, no epileptiform activity  CULTURES: Nasal MRSA PCR 4/7 >> positive Blood 4/7 >> Sputum 4/7 >. U/A 4/7 >> neg nitrites, trace leuks, rare bacteria Tracheal aspirate 4/7 >> squams, gram + cocci pairs, gram - coccobacilli  ANTIBIOTICS: Vancomycin 4/7 >> Meropenem 4/7 >> Eraxis 4/7 >>   SIGNIFICANT EVENTS: 4/7 > Admitted to 60M from [facility] s/p Cardiac Arrest   LINES/TUBES: PICC RUE Trach   SUBJECTIVE:  Awake, opening eyes  VITAL SIGNS: BP 106/70   Pulse 66   Temp (!) 97.2 F (36.2 C)  (Axillary)   Resp (!) 25   Ht 5\' 6"  (1.676 m)   Wt 112 lb 7 oz (51 kg)   SpO2 100%   BMI 18.15 kg/m   HEMODYNAMICS:    VENTILATOR SETTINGS: Vent Mode: PRVC FiO2 (%):  [40 %] 40 % Set Rate:  [28 bmp] 28 bmp Vt Set:  [510 mL] 510 mL PEEP:  [5 cmH20] 5 cmH20 Plateau Pressure:  [17 cmH20-26 cmH20] 17 cmH20  INTAKE / OUTPUT: I/O last 3 completed shifts: In: 59 [I.V.:3783; NG/GT:50; IV Piggyback:1390] Out: 3580 [Urine:1130; Emesis/NG output:750; Stool:1700]  PHYSICAL EXAMINATION: General:  Chronically ill adult male Neuro: awake, opens eyes, nods head HEENT:  Trach in place > clean, dry  Cardiovascular:  RRR, no MRG  Lungs:  Clear breath sounds anteriorly, no crackles/wheeze  Abdomen:  Non-distended, active bowel sounds  Musculoskeletal: thin extremities, contractures, no edema Skin:  Warm, dry, stage IV sacral wound   LABS:  BMET Recent Labs  Lab 05/25/17 0449 05/25/17 1030 05/26/17 0435  NA 131* 134* 133*  K 2.9* 3.9 3.3*  CL 90* 89* 90*  CO2 27 27 28   BUN 51* 54* 54*  CREATININE 2.47* 2.71* 3.15*  GLUCOSE 217* 102* 107*    Electrolytes Recent Labs  Lab 05/24/17 0435 05/25/17 0449 05/25/17 1030 05/26/17 0435  CALCIUM 9.6 7.3* 8.1* 8.1*  MG 2.4 1.4*  --  2.0  PHOS 9.8* 2.9  --  2.8    CBC Recent Labs  Lab 05/24/17 0435 05/25/17 0449 05/26/17 0435  WBC 32.0* 21.4* 16.2*  HGB 10.6* 9.4* 8.9*  HCT 34.4* 26.8* 26.8*  PLT 665* 481* 422*    Coag's Recent Labs  Lab 05/24/17 0642  APTT 72*  INR 1.98    Sepsis Markers Recent Labs  Lab 05/24/17 0435 05/24/17 0642 05/24/17 1719 05/25/17 0449 05/26/17 0435  LATICACIDVEN 6.2* 1.3 1.4  --   --   PROCALCITON  --  0.89  --  12.37 7.59    ABG Recent Labs  Lab 05/24/17 0853 05/25/17 0430 05/26/17 0450  PHART 7.215* 7.530* 7.534*  PCO2ART 52.9* 35.8 36.4  PO2ART 146.0* 93.9 119*    Liver Enzymes Recent Labs  Lab 05/23/17 1344 05/24/17 0435  AST 84* 128*  ALT 43 63  ALKPHOS  259* 283*  BILITOT 1.5* 1.3*  ALBUMIN 3.0* 3.0*    Cardiac Enzymes Recent Labs  Lab 05/24/17 0642  TROPONINI <0.03    Glucose Recent Labs  Lab 05/25/17 1553 05/25/17 1947 05/25/17 2033 05/25/17 2342 05/26/17 0328 05/26/17 0742  GLUCAP 148* 56* 109* 104* 109* 107*    Imaging Dg Chest Port 1 View  Result Date: 05/26/2017 CLINICAL DATA:  Respiratory failure EXAM: PORTABLE CHEST 1 VIEW COMPARISON:  May 25, 2017 FINDINGS: Tracheostomy catheter tip is 4.6 cm above the carina. Central catheter tip is in the superior vena cava. Nasogastric tube tip and side port are in the stomach. Pacemaker leads are attached to the right atrium and right ventricle. No pneumothorax. There is mild atelectasis in the bases. Lungs elsewhere clear. Heart size and pulmonary vascularity are normal. No adenopathy. No evident bone lesions. There is generalized bowel dilatation. IMPRESSION: And catheter positions as described without pneumothorax. Bibasilar atelectasis. No consolidation. Stable cardiac silhouette. Generalized bowel dilatation, a finding also present on prior study. Electronically Signed   By: Bretta Bang III M.D.   On: 05/26/2017 07:05     DISCUSSION: 62 year old male with recent hospitalization for acute on chronic hypercarbic/hypoxic respiratory failure with multiple cardiac arrests. Discharged to [facility] on 4/5. 4/7 found pulseless, started CPR and given 1 EPI with return of ROSC. Transferred to [facility] ICU.  ASSESSMENT / PLAN:  PULMONARY A: Acute on Chronic Hypoxic/Hypercarbic Respiratory Failure s/p Tracheostomy/Vent Dependent  H/O OSA, Restrictive lung disease P:   Vent Support - reduce rate to 24 ABG/CXR VAP Bundle  Scheduled Duoneb  Pulmonary Hygiene   CARDIOVASCULAR A:  Cardiac Arrest (Presumed Asystole/PEA)  Presumed Septic Shock with unknown etiology > with stage IV sacral wound, recent fungemia Chronic Systolic Heart Failure (EF 30-35) s/p ICD  H/O DVT   Cardiac Arrest 2/26, 3/25, 4/7  P:  Cardiac Monitoring Wean Levophed to Maintain MAP > 65  RENAL A:   Anion Gap Metabolic Acidosis  Lactic Acidosis in setting of hypoperfusion vs sepsis  LA 6.2 >> 1.3, 1.4 Acute Kidney Injury s/p 1.5 L NS  Renal U/S w/o hydronephrosis Hypokalemia - 2.9, Mg 1.4 P:   Trend BMP/UOP/I&Os Replace electrolytes as indicated - repleting K    GASTROINTESTINAL A:   Severe Protein Calorie Malnutrition  H/O Crohn's  -Colon Resection with Ileostomy 2013 -Ileostomy Reversal/TakeDown 2013 P:   NPO Pepcid   HEMATOLOGIC A:   Anemia of Chronic Disease  -H/O DVT, did not tolerate anticoagulation due to anemia - recent U/S with no DVT   P:  Trend CBC  Maintain Hbg >7   INFECTIOUS A:  Presumed Septic Shock with unknown etiology > with stage IV sacral wound, recent fungemia H/O Osteomyelitis, Recent Treatment with Candida Parapsilosis Fungemia (suspected due to PICC)  -Treated with Fluconazole (1/2-1/14)  P:   Trend Fever and WBC curve Vancomycin, Meropenem, Eraxis  F/u cultures  ENDOCRINE A:   DM > Documented Hypoglycemia events  Hypothyroidism    P:   Trend Glucose  SSI  Levothyroxine 44 mcg daily per tube  NEUROLOGIC A:   Metabolic Encephalopathy vs Anoxia  P:   RASS goal: 0/-1 PRN Versed/Fentanyl    FAMILY  - Updates: Per Care Everywhere, patient has multiple family members who are having a hard time agreeing on code status - Ethics was involved at OSH. 4/9 > no family at bedside.  - Palliative Care meeting 4/8: Continue current care, ongoing discussions to be continued  Darreld Mclean, MD Internal Medicine PGY-3

## 2017-05-26 NOTE — Care Management (Addendum)
Update:  CM spoke with pts brother and mom Melvin Kelley - both are in agreement for pt to discharge back to [facility] once insurance Berkley Harvey is obtained.  Insurance auth initated  Pt is from [facility].  Pt is chronic trach/vent.  CM received consult for pt to return to [facility].  CM informed by Palliative NP that POC is pts mom Melvin Kelley.  CM left voicemail for mom requesting call back   [facility] informed pt ready for discharge - facility will pursue insur auth once CM discusses discharge plan to return with [facility] with pts mom Melvin Kelley

## 2017-05-26 NOTE — Progress Notes (Signed)
   05/26/17 1000  Clinical Encounter Type  Visited With Patient  Visit Type Initial  Referral From Palliative care team  Consult/Referral To Chaplain  Spiritual Encounters  Spiritual Needs Other (Comment)  Stress Factors  Patient Stress Factors Exhausted  Family Stress Factors None identified  Advance Directives (For Healthcare)  Does Patient Have a Medical Advance Directive? No    Pt was laying in his bed with trachea tube in place. No family were present at the time. Chaplain talked to Pt's direct care nurse who said that the last time he saw family was Sunday. Chaplain saw two scripture notes written and stuck on the two bed rails. One was from Psalms 25 and the other was Hebrews 11:1.The pt was not able to speak.Chaplain told pt and nurse that she would be back to check on the Pt again when family members show up any other time. Chaplain provided emotional support through compassionate presence.  Penny Arrambide a Water quality scientist, [employer]

## 2017-05-26 NOTE — Discharge Summary (Signed)
[facility] PULMONARY / CRITICAL CARE MEDICINE DISCHARGE SUMMARY  Name: Melvin Kelley MRN: [id number] DOB: 03/05/1955 62 y.o. PCP: Gerline Legacy, MD  Date of Admission: 05/24/2017  5:15 AM Date of Discharge: 06/14/2017  Attending Physician: Kalman Shan, MD  Discharge Diagnosis: Active Problems:   Cardiac arrest [facility])   Acute on chronic respiratory failure [facility])   Adult failure to thrive   Palliative care by specialist   DNR (do not resuscitate) discussion   AKI (acute kidney injury) (HCC)   Protein-calorie malnutrition, severe   Pressure injury of skin   Discharge Medications: Allergies as of 06/03/2017      Reactions   Morphine And Related Shortness Of Breath   Glipizide Itching   Levofloxacin Hives   Penicillins Hives   Sulfa Antibiotics Hives   Trazodone And Nefazodone Itching   Codeine Rash      Medication List    STOP taking these medications   buPROPion 75 MG tablet Commonly known as:  WELLBUTRIN   carvedilol 12.5 MG tablet Commonly known as:  COREG   DULoxetine 20 MG capsule Commonly known as:  CYMBALTA   haloperidol lactate 5 MG/ML injection Commonly known as:  HALDOL   lactulose 10 GM/15ML solution Commonly known as:  CHRONULAC   LORazepam 1 MG tablet Commonly known as:  ATIVAN   LORazepam 1 mg/mL in dextrose 5 %   metoCLOPramide 5 MG/ML injection Commonly known as:  REGLAN   ondansetron 4 MG/2ML Soln injection Commonly known as:  ZOFRAN   pantoprazole 40 MG injection Commonly known as:  PROTONIX   QUEtiapine 25 MG tablet Commonly known as:  SEROQUEL   sodium bicarbonate 650 MG tablet     TAKE these medications   acetaminophen 325 MG tablet Commonly known as:  TYLENOL Place 650 mg into feeding tube every 6 (six) hours as needed for mild pain or fever.   albuterol (2.5 MG/3ML) 0.083% nebulizer solution Commonly known as:  PROVENTIL Take 2.5 mg by nebulization every 6 (six) hours as needed for wheezing or shortness of  breath.   bisacodyl 10 MG suppository Commonly known as:  DULCOLAX Place 10 mg rectally as needed for moderate constipation.   Cholecalciferol 1000 units Tbdp Place 5,000 Units into feeding tube daily.   famotidine 40 MG/5ML suspension Commonly known as:  PEPCID Place 2.5 mLs (20 mg total) into feeding tube at bedtime.   gabapentin 250 MG/5ML solution Commonly known as:  NEURONTIN Place 300 mg into feeding tube 3 (three) times daily.   insulin lispro 100 UNIT/ML injection Commonly known as:  HUMALOG Inject 0-10 Units into the skin 3 (three) times daily before meals.   levothyroxine 88 MCG tablet Commonly known as:  SYNTHROID, LEVOTHROID Place 44 mcg into feeding tube daily before breakfast.   lidocaine 5 % Commonly known as:  LIDODERM Place 1 patch onto the skin daily. Remove & Discard patch within 12 hours or as directed by MD   Melatonin 3 MG Tabs Place 4.5 mg into feeding tube at bedtime.   meropenem 500 mg in sodium chloride 0.9 % 100 mL Inject 500 mg into the vein daily for 1 day.   montelukast 10 MG tablet Commonly known as:  SINGULAIR Place 10 mg into feeding tube at bedtime.   multivitamin with minerals tablet Place 1 tablet into feeding tube daily.   sennosides-docusate sodium 8.6-50 MG tablet Commonly known as:  SENOKOT-S Take 1 tablet by mouth 2 (two) times daily as needed for constipation.   tamsulosin  0.4 MG Caps capsule Commonly known as:  FLOMAX Take 0.4 mg by mouth daily.   thiamine 100 MG tablet Place 100 mg into feeding tube daily.   TPN ADULT Inject into the vein continuous.       Disposition and follow-up:   Melvin Kelley was discharged from [facility] [place] condition.  At the hospital follow up visit please address:  1.   Goals of Care: Continue goals of care conversation with family. Daughter, Sonny Masters, considering change to no CPR status.   PEA Arrest: Monitor rhythm. AICD in place. Weaned off pressors on  05/28/17.  Chronic Respiratory Failure: Continue trach/vent care/management  Infectious: Sepsis with unspecified source, completed 5 days of Vancomycin and Anidulafungin. Continue Meropenem to complete 7 total days with last day on 05/30/17.  AKI: ATN likely secondary to septic vs cardiogenic shock. Creatinine peaked at 4.45. Maintained urine output but volume decreased. Continue to monitor renal function and urine output.  Nutrition: TPN resumed 05/28/17.  2.  Labs / imaging needed at time of follow-up: Monitor renal function, electrolytes, magnesium.  3.  Pending labs/ test needing follow-up: none  Follow-up Appointments: Follow-up Information    Gerline Legacy, MD .   Specialty:  Pulmonary Disease Contact information: [address] 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  Admitted at [facility] 12/14-12/21 with hypercarbic respiratory failure with AMS. Intubated 12/14. Extubated 12/18. Discharged Home.  Admitted at [facility] 12/29-1/11 with AMS, Hypercapnia, and Fungemia with Candida Parapsilosis  Admitted at [facility] 2/26-3/22 s/p Cardiac Arrest in setting of sepsis, respiratory failure  Admitted at [facility] 3/25-4/5 for hypercapnic/hypoxic induced asystolic cardiac arrest with PCO2 >100  On 4/7, patient was found pulseless at [facility].  Started CPR and given 1 EPI with return of ROSC. Transferred to [facility] ICU for further management. ABG 7.033/46/426. Crt 2.79. WBC 32.0. LA 6.2. Provided vent support via trach, bicarbonate drip, IV fluids, pressor support (levophed), and started on broad spectrum anti-infectious agents (Vancomycin, Meropenem, Anidulafungin) with improvement to baseline mental status (alert, opens eyes and tracks, nods head, attempts to speak, moves upper extremities).  Septic/Cardiogenic Shock: Transferred from [facility] to [facility] ICU on 4/7 after PEA arrest. Required pressor support with levophed which was weaned off on 05/28/17. Blood and respiratory cultures were negative.  Acute on Chronic Hypoxic/Hypercarbic Respiratory Failure s/p Tracheostomy/Vent Dependent: Required sodium bicarbonate drip initially for acidosis which was discontinued on 05/25/17. Maintained on vent support throughout ICU admission with pulmonary hygiene and scheduled duonebs.  Acute Kidney Injury:  Likely ATN from septic/cardiogenic shock. Creatinine peaked at 4.45. Maintained urine output but volume decreased. There was no indication for dialysis and family was informed that dialysis would not be considered during this admission.  Severe Protein Calorie Malnutrition/ Short gut syndrome: TPN initially held but resumed on 05/28/17. Large amount of  air in the colon at the left diaphragm was noted on daily xrays which has been chronic with ileus.   Discharge Vitals:   BP 118/82   Pulse 79   Temp 98.4 F (36.9 C) (Oral)   Resp (!) 23   Ht 5\' 6"  (1.676 m)   Wt 107 lb 2.3 oz (48.6 kg)   SpO2 99%   BMI 17.29 kg/m   Pertinent Labs, Studies, and Procedures:  CBC Latest Ref Rng & Units 05/31/2017 05/28/2017 05/27/2017  WBC 4.0 - 10.5 K/uL 4.5 6.9 9.6  Hemoglobin 13.0 - 17.0 g/dL 8.0(L) 8.4(L) 8.5(L)  Hematocrit 39.0 - 52.0 % 25.0(L) 25.7(L) 25.5(L)  Platelets 150 - 400 K/uL 223 321 385    BMP Latest Ref Rng & Units  06/10/2017 05/28/2017 05/27/2017  Glucose 65 - 99 mg/dL 478(G) 74 71  BUN 6 - 20 mg/dL 95(A) 21(H) 08(M)  Creatinine 0.61 - 1.24 mg/dL 5.78(I) 6.96(E) 9.52(W)  Sodium 135 - 145 mmol/L 137 138 134(L)  Potassium 3.5 - 5.1 mmol/L 3.7 4.1 3.9  Chloride 101 - 111 mmol/L 96(L) 96(L) 91(L)  CO2 22 - 32 mmol/L 30 30 28   Calcium 8.9 - 10.3 mg/dL 9.0 8.9 4.1(L)     STUDIES:  CXR 4/7 > Improved aeration of the right lung. No new consolidation identified. Stable lines and tubes. Persistent partially visualized distention of bowel. ECHO 4/8 >> EF 45-50%, limited study EEG 4/8 >> diffuse background suppression and slowing, no epileptiform activity Renal U/S 4/7 IMPRESSION: 1. No evidence of hydronephrosis. 2. Mildly echogenic renal parenchyma bilaterally suggests underlying medical renal disease. 3. Small parenchymal cortical calcification at the upper pole of the right kidney. 4. Probable 1.4 cm right renal cyst.  CULTURES: Nasal MRSA PCR 4/7 >> positive Blood 4/7 >> No growth at 5 days Fungal blood culture 4/7 >> no growth at 5 days U/A 4/7 >> neg nitrites, trace leuks, rare bacteria Tracheal aspirate 4/7 >> normal respiratory flora  ANTIBIOTICS: Vancomycin 4/7 >> 4/11 (received 2 doses due to renal dosing) Meropenem 4/7 >> 4/13 Anidulafungin 4/7 >> 4/11  Signed: Darreld Mclean, MD  Internal Medicine PGY-3 06/15/2017, 1:46 PM

## 2017-05-27 ENCOUNTER — Inpatient Hospital Stay: Payer: Self-pay

## 2017-05-27 ENCOUNTER — Inpatient Hospital Stay (HOSPITAL_COMMUNITY): Payer: Medicare Other

## 2017-05-27 DIAGNOSIS — L8990 Pressure ulcer of unspecified site, unspecified stage: Secondary | ICD-10-CM

## 2017-05-27 DIAGNOSIS — E43 Unspecified severe protein-calorie malnutrition: Secondary | ICD-10-CM

## 2017-05-27 LAB — GLUCOSE, CAPILLARY
GLUCOSE-CAPILLARY: 87 mg/dL (ref 65–99)
Glucose-Capillary: 79 mg/dL (ref 65–99)
Glucose-Capillary: 74 mg/dL (ref 65–99)
Glucose-Capillary: 89 mg/dL (ref 65–99)
Glucose-Capillary: 74 mg/dL (ref 65–99)
Glucose-Capillary: 90 mg/dL (ref 65–99)

## 2017-05-27 LAB — CBC
HEMATOCRIT: 25.5 % — AB (ref 39.0–52.0)
HEMOGLOBIN: 8.5 g/dL — AB (ref 13.0–17.0)
MCH: 27.1 pg (ref 26.0–34.0)
MCHC: 33.3 g/dL (ref 30.0–36.0)
MCV: 81.2 fL (ref 78.0–100.0)
PLATELETS: 385 10*3/uL (ref 150–400)
RBC: 3.14 MIL/uL — ABNORMAL LOW (ref 4.22–5.81)
RDW: 18.1 % — ABNORMAL HIGH (ref 11.5–15.5)
WBC: 9.6 10*3/uL (ref 4.0–10.5)

## 2017-05-27 LAB — CULTURE, RESPIRATORY W GRAM STAIN

## 2017-05-27 LAB — RENAL FUNCTION PANEL
ANION GAP: 15 (ref 5–15)
Albumin: 2.4 g/dL — ABNORMAL LOW (ref 3.5–5.0)
BUN: 50 mg/dL — ABNORMAL HIGH (ref 6–20)
CHLORIDE: 91 mmol/L — AB (ref 101–111)
CO2: 28 mmol/L (ref 22–32)
CREATININE: 3.61 mg/dL — AB (ref 0.61–1.24)
Calcium: 8.7 mg/dL — ABNORMAL LOW (ref 8.9–10.3)
GFR, EST AFRICAN AMERICAN: 20 mL/min — AB (ref >60)
GFR, EST NON AFRICAN AMERICAN: 17 mL/min — AB (ref >60)
Glucose, Bld: 71 mg/dL (ref 65–99)
Phosphorus: 3.4 mg/dL (ref 2.5–4.6)
Potassium: 3.9 mmol/L (ref 3.5–5.1)
Sodium: 134 mmol/L — ABNORMAL LOW (ref 135–145)

## 2017-05-27 LAB — CULTURE, RESPIRATORY: CULTURE: NORMAL

## 2017-05-27 LAB — CORTISOL: Cortisol, Plasma: 20.5 ug/dL

## 2017-05-27 LAB — MAGNESIUM: MAGNESIUM: 2 mg/dL (ref 1.7–2.4)

## 2017-05-27 MED ORDER — POTASSIUM CHLORIDE 20 MEQ/15ML (10%) PO SOLN
40.0000 meq | Freq: Once | ORAL | Status: AC
  Administered 2017-05-27: 40 meq
  Filled 2017-05-27: qty 30

## 2017-05-27 MED ORDER — DEXTROSE IN LACTATED RINGERS 5 % IV SOLN
INTRAVENOUS | Status: DC
  Administered 2017-05-27 – 2017-05-28 (×2): via INTRAVENOUS

## 2017-05-27 NOTE — Progress Notes (Signed)
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 Intubated 12/14. Extubated 12/18. Discharged Home.  Admitted at [facility] 12/29-1/11 with AMS, Hypercapnia, and Fungemia with Candida Parapsilosis  Admitted at [facility] 2/26-3/22 s/p Cardiac Arrest in setting of sepsis, respiratory failure  Admitted at [facility] 3/25-4/5 for hypercapnic/hypoxic induced asystolic cardiac arrest with PCO2 >100  On 4/7 patient found pulseless at [facility]. Started CPR and given 1 EPI with return of ROSC. Transferred to [facility] For further management. ABG 7.033/46/426. Crt 2.79. WBC 32.0. LA 6.2   STUDIES:  CXR 4/7 > Improved aeration of the right lung. No new consolidation identified. Stable lines and tubes. Persistent partially visualized distention of bowel. ECHO 4/8 >> EF 45-50%, limited study EEG 4/8 >> diffuse background suppression and slowing, no epileptiform activity  CULTURES: Nasal MRSA PCR 4/7 >> positive Blood 4/7 >> Sputum 4/7 >. U/A 4/7 >> neg nitrites, trace leuks, rare bacteria Tracheal aspirate 4/7 >> consistent w/ respiratory flora  ANTIBIOTICS: Vancomycin 4/7 >> Meropenem 4/7 >> Eraxis 4/7 >>   SIGNIFICANT EVENTS: 4/7 > Admitted to [facility] from [facility] s/p Cardiac Arrest   LINES/TUBES: PICC RUE Trach   SUBJECTIVE:  Awake, opening eyes  VITAL SIGNS: BP (!) 105/56   Pulse 68   Temp (!) 96.5 F (35.8 C) (Axillary)    Resp (!) 24   Ht 5\' 6"  (1.676 m)   Wt 115 lb 15.4 oz (52.6 kg)   SpO2 100%   BMI 18.72 kg/m   HEMODYNAMICS: CVP:  [14 mmHg] 14 mmHg  VENTILATOR SETTINGS: Vent Mode: PRVC FiO2 (%):  [40 %] 40 % Set Rate:  [24 bmp] 24 bmp Vt Set:  [510 mL] 510 mL PEEP:  [5 cmH20] 5 cmH20 Plateau Pressure:  [20 cmH20-33 cmH20] 33 cmH20  INTAKE / OUTPUT: I/O last 3 completed shifts: In: 4307.6 [I.V.:3167.6; NG/GT:130; IV Piggyback:1010] Out: 4150 [Urine:1200; Emesis/NG output:850; Stool:2100]  PHYSICAL EXAMINATION: General:  Chronically ill cachectic adult male  Neuro: awake, opens eyes, nods head HEENT:  Trach in place > clean, dry  Cardiovascular:  RRR, no MRG  Lungs:  Clear breath sounds anteriorly, no crackles/wheeze  Abdomen:  Non-distended, active bowel sounds  Musculoskeletal: thin extremities, contractures all extremities, no edema Skin:  Warm, stage IV sacral wound  LABS:  BMET Recent Labs  Lab 05/25/17 0449 05/25/17 1030 05/26/17 0435  NA 131* 134* 133*  K 2.9* 3.9 3.3*  CL 90* 89* 90*  CO2 27 27 28   BUN 51* 54* 54*  CREATININE 2.47* 2.71* 3.15*  GLUCOSE 217* 102* 107*    Electrolytes Recent Labs  Lab 05/24/17 0435 05/25/17 0449 05/25/17 1030 05/26/17 0435  CALCIUM 9.6 7.3* 8.1* 8.1*  MG 2.4 1.4*  --  2.0  PHOS 9.8* 2.9  --  2.8    CBC Recent Labs  Lab 05/24/17 0435 05/25/17 0449 05/26/17 0435  WBC 32.0* 21.4* 16.2*  HGB 10.6* 9.4* 8.9*  HCT 34.4* 26.8* 26.8*  PLT 665* 481* 422*    Coag's  Recent Labs  Lab 05/24/17 0642  APTT 72*  INR 1.98    Sepsis Markers Recent Labs  Lab 05/24/17 0435 05/24/17 0642 05/24/17 1719 05/25/17 0449 05/26/17 0435  LATICACIDVEN 6.2* 1.3 1.4  --   --   PROCALCITON  --  0.89  --  12.37 7.59    ABG Recent Labs  Lab 05/25/17 0430 05/26/17 0450 05/26/17 1019  PHART 7.530* 7.534* 7.501*  PCO2ART 35.8 36.4 38.6  PO2ART 93.9 119* 117.0*    Liver Enzymes Recent Labs  Lab 05/23/17 1344 05/24/17 0435   AST 84* 128*  ALT 43 63  ALKPHOS 259* 283*  BILITOT 1.5* 1.3*  ALBUMIN 3.0* 3.0*    Cardiac Enzymes Recent Labs  Lab 05/24/17 0642  TROPONINI <0.03    Glucose Recent Labs  Lab 05/26/17 0742 05/26/17 1121 05/26/17 1519 05/26/17 1936 05/26/17 2352 05/27/17 0350  GLUCAP 107* 131* 95 83 80 79    Imaging Dg Chest Port 1 View  Result Date: 05/27/2017 CLINICAL DATA:  Check tracheostomy tube EXAM: PORTABLE CHEST 1 VIEW COMPARISON:  05/26/2017 FINDINGS: Tracheostomy tube, nasogastric catheter and right-sided PICC line are noted in satisfactory position. Defibrillator is again seen. Cardiac shadow is stable. The lungs are well aerated bilaterally. Some platelike atelectasis is noted in the right lung base. No new focal infiltrate is seen. IMPRESSION: Tubes and lines as described. Right basilar atelectasis. Electronically Signed   By: Alcide Clever M.D.   On: 05/27/2017 07:09     DISCUSSION: 62 year old male with recent hospitalization for acute on chronic hypercarbic/hypoxic respiratory failure with multiple cardiac arrests. Discharged to [facility] on 4/5. 4/7 found pulseless, started CPR and given 1 EPI with return of ROSC. Transferred to [facility] ICU on 4/7.  ASSESSMENT / PLAN:  PULMONARY A: Acute on Chronic Hypoxic/Hypercarbic Respiratory Failure s/p Tracheostomy/Vent Dependent  H/O OSA, Restrictive lung disease P:   Continue Vent Support Monitor Intermittent ABG/CXR VAP Bundle  Scheduled Duoneb  Pulmonary Hygiene   CARDIOVASCULAR A:  Cardiac Arrest (Presumed Asystole/PEA)  Presumed Septic Shock with unknown etiology > with stage IV sacral wound, recent fungemia Chronic Systolic Heart Failure (EF 30-35) s/p ICD  H/O DVT  Cardiac Arrest 2/26, 3/25, 4/7  P:  Cardiac Monitoring Wean Levophed to Maintain MAP > 65  RENAL A:   Anion Gap Metabolic Acidosis  Lactic Acidosis in setting of hypoperfusion vs sepsis  LA 6.2 >> 1.3, 1.4 Acute Kidney Injury s/p 1.5 L NS   Renal U/S w/o hydronephrosis Hypokalemia P:   Trend BMP/UOP/I&Os Replace electrolytes as indicated   GASTROINTESTINAL A:   Severe Protein Calorie Malnutrition on TPN @ [facility] H/O Crohn's  -Colon Resection with Ileostomy 2013 -Ileostomy Reversal/TakeDown 2013 P:   NPO Pepcid  HEMATOLOGIC A:   Anemia of Chronic Disease  -H/O DVT, did not tolerate anticoagulation due to anemia - recent U/S with no DVT   P:  Trend CBC  Maintain Hbg >7   INFECTIOUS A:  Presumed Septic Shock with unknown etiology > with stage IV sacral wound, recent fungemia H/O Osteomyelitis, Recent Treatment with Candida Parapsilosis Fungemia (suspected due to PICC)  -Treated with Fluconazole (1/2-1/14)  P:   Trend Fever and WBC curve Vancomycin, Meropenem, Eraxis  F/u cultures  ENDOCRINE A:   DM > Documented Hypoglycemia events  Hypothyroidism    P:   Trend Glucose  SSI  Levothyroxine 44 mcg daily per tube  NEUROLOGIC A:   Metabolic Encephalopathy vs Anoxia  P:   RASS goal: 0/-1  PRN Versed/Fentanyl    FAMILY  - Updates: Per Care Everywhere, patient has multiple family members who are having a hard time agreeing on code status - Ethics was involved at OSH. Plan to transfer back to [facility] once insurance authorizes. 4/10 > no family at bedside.  - Palliative Care meeting 4/8: Continue current care, ongoing discussions to be continued  Darreld Mclean, MD Internal Medicine PGY-3

## 2017-05-27 NOTE — Progress Notes (Signed)
Spoke with the [employer]. They are approving him for [facility] placement. Will continue to work towards getting patient back.

## 2017-05-27 NOTE — Progress Notes (Signed)
Pharmacy consulted to resume TPN. Discussed with RN and patient only has double lumen PICC and would not have a dedicated line to run TPN  Discussed with Dr Marchelle Gearing who will order/place triple lumen PICC. He also said to wait until Thu 4/11 to begin TPN after increasing IV access in this patient  Isaac Bliss, PharmD, BCPS, BCCCP Clinical Pharmacist Clinical phone for 05/27/2017 from 7a-3:30p: [phone] If after 3:30p, please call main pharmacy at: [phone] 05/27/2017 10:38 AM

## 2017-05-27 NOTE — Progress Notes (Signed)
Nutrition Follow-up  DOCUMENTATION CODES:   Severe malnutrition in context of chronic illness, Underweight  INTERVENTION:   Resume TPN per Pharmacy as able  NUTRITION DIAGNOSIS:   Severe Malnutrition related to chronic illness(short gut syndrome) as evidenced by severe muscle depletion, severe fat depletion.  GOAL:   Patient will meet greater than or equal to 90% of their needs  MONITOR:   Vent status, Skin, Labs, I & O's  REASON FOR ASSESSMENT:   Consult New TPN/TNA  ASSESSMENT:   62 yo male with PMH of Crohn's disease, short gut syndrome on TPN, OSA, CHF, AICD, malnutrition, MDRO, asthma, anemia, sacral decubitus ulcer, cardiac arrest, and anoxic encephalopathy who was transferred to [facility] from [facility] on 4/7 with cardiac arrest (found pulseless at [facility]).  Discussed patient with RN today.  Remains on levophed; dose has been reduced. Plans for transfer back to [facility] when insurance approves. Palliative Care Team following for discussions regarding goals of care. Remains a full code at this time.  Plans to place triple lumen PICC for TNA administration. TNA will be resumed tomorrow.   Patient remains intubated on ventilator support MV: 11.3 L/min Temp (24hrs), Avg:96.6 F (35.9 C), Min:94 F (34.4 C), Max:98.6 F (37 C)  Propofol: none  Labs reviewed. Sodium 134 (L) CBG's: 74-89 Medications reviewed and include KCl.    Diet Order:  Diet NPO time specified TPN ADULT  EDUCATION NEEDS:   No education needs have been identified at this time  Skin:  Skin Assessment: Skin Integrity Issues: Skin Integrity Issues:: Stage IV Stage IV: sacrum  Last BM:  4/10 (flexiseal)  Height:   Ht Readings from Last 1 Encounters:  05/24/17 5\' 6"  (1.676 m)    Weight:   Wt Readings from Last 1 Encounters:  05/27/17 115 lb 15.4 oz (52.6 kg)   Admission weight 4/7: 101 lb 6.6 oz (46 kg)  Ideal Body Weight:  64.5 kg  BMI:  Body mass index is 18.72  kg/m.  Estimated Nutritional Needs:   Kcal:  1550  Protein:  80-90 gm  Fluid:  1.6 L    Joaquin Courts, RD, LDN, CNSC Pager [phone] After Hours Pager [phone]

## 2017-05-27 NOTE — Progress Notes (Signed)
Patient with no urine output last 3 hours however he has 1600 cc green watery stool via flexiseal today. Dr. Marchelle Gearing informed

## 2017-05-28 ENCOUNTER — Inpatient Hospital Stay (HOSPITAL_COMMUNITY): Payer: Medicare Other

## 2017-05-28 DIAGNOSIS — E43 Unspecified severe protein-calorie malnutrition: Secondary | ICD-10-CM

## 2017-05-28 LAB — CBC
HEMATOCRIT: 25.7 % — AB (ref 39.0–52.0)
Hemoglobin: 8.4 g/dL — ABNORMAL LOW (ref 13.0–17.0)
MCH: 27 pg (ref 26.0–34.0)
MCHC: 32.7 g/dL (ref 30.0–36.0)
MCV: 82.6 fL (ref 78.0–100.0)
Platelets: 321 10*3/uL (ref 150–400)
RBC: 3.11 MIL/uL — ABNORMAL LOW (ref 4.22–5.81)
RDW: 17.9 % — ABNORMAL HIGH (ref 11.5–15.5)
WBC: 6.9 10*3/uL (ref 4.0–10.5)

## 2017-05-28 LAB — GLUCOSE, CAPILLARY
GLUCOSE-CAPILLARY: 48 mg/dL — AB (ref 65–99)
GLUCOSE-CAPILLARY: 90 mg/dL (ref 65–99)
GLUCOSE-CAPILLARY: 97 mg/dL (ref 65–99)
Glucose-Capillary: 73 mg/dL (ref 65–99)
Glucose-Capillary: 77 mg/dL (ref 65–99)
Glucose-Capillary: 77 mg/dL (ref 65–99)

## 2017-05-28 LAB — TRIGLYCERIDES: Triglycerides: 94 mg/dL (ref <150)

## 2017-05-28 LAB — COMPREHENSIVE METABOLIC PANEL
ALBUMIN: 2.6 g/dL — AB (ref 3.5–5.0)
ALK PHOS: 223 U/L — AB (ref 38–126)
ALT: 45 U/L (ref 17–63)
AST: 83 U/L — AB (ref 15–41)
Anion gap: 12 (ref 5–15)
BILIRUBIN TOTAL: 1 mg/dL (ref 0.3–1.2)
BUN: 50 mg/dL — AB (ref 6–20)
CALCIUM: 8.9 mg/dL (ref 8.9–10.3)
CO2: 30 mmol/L (ref 22–32)
Chloride: 96 mmol/L — ABNORMAL LOW (ref 101–111)
Creatinine, Ser: 4.42 mg/dL — ABNORMAL HIGH (ref 0.61–1.24)
GFR calc Af Amer: 15 mL/min — ABNORMAL LOW (ref >60)
GFR calc non Af Amer: 13 mL/min — ABNORMAL LOW (ref >60)
GLUCOSE: 74 mg/dL (ref 65–99)
Potassium: 4.1 mmol/L (ref 3.5–5.1)
Sodium: 138 mmol/L (ref 135–145)
TOTAL PROTEIN: 6.9 g/dL (ref 6.5–8.1)

## 2017-05-28 LAB — PHOSPHORUS: Phosphorus: 4.1 mg/dL (ref 2.5–4.6)

## 2017-05-28 LAB — MAGNESIUM: Magnesium: 1.9 mg/dL (ref 1.7–2.4)

## 2017-05-28 LAB — PREALBUMIN: Prealbumin: 10.1 mg/dL — ABNORMAL LOW (ref 18–38)

## 2017-05-28 MED ORDER — VANCOMYCIN HCL IN DEXTROSE 750-5 MG/150ML-% IV SOLN: 750.0000 mg | INTRAVENOUS | Status: DC

## 2017-05-28 MED ORDER — TRAVASOL 10 % IV SOLN
INTRAVENOUS | Status: DC
  Administered 2017-05-28: 17:00:00 via INTRAVENOUS
  Filled 2017-05-28: qty 600

## 2017-05-28 MED ORDER — DEXTROSE IN LACTATED RINGERS 5 % IV SOLN
INTRAVENOUS | Status: DC
  Administered 2017-05-28: 17:00:00 via INTRAVENOUS

## 2017-05-28 MED ORDER — SODIUM CHLORIDE 0.9 % IV SOLN
500.0000 mg | INTRAVENOUS | Status: DC
  Administered 2017-05-28: 500 mg via INTRAVENOUS
  Filled 2017-05-28 (×2): qty 0.5

## 2017-05-28 NOTE — Progress Notes (Signed)
RR changed to 18 by MD. Vitals are stable. RT will continue to monitor.

## 2017-05-28 NOTE — Care Management (Addendum)
05/28/2017  Update:  Per attending pt is not ready for discharge back to [facility] today  Pt was originally denied for [facility] [employer] - however decision was overturned post peer to peer.  [facility] has already gained consent from family to move forward with discharge back to [facility] today.    05/26/17 Update:  CM spoke with pts brother and mom Melvin Kelley - both are in agreement for pt to discharge back to [facility] once insurance [facility] [employer] is obtained.  Insurance auth initated  Pt is from [facility].  Pt is chronic trach/vent.  CM received consult for pt to return to [facility].  CM informed by Palliative NP that POC is pts mom Melvin Kelley.  CM left voicemail for mom requesting call back   [facility] informed pt ready for discharge - facility will pursue insur auth once CM discusses discharge plan to return with [facility] with pts mom Melvin Kelley

## 2017-05-28 NOTE — Progress Notes (Signed)
PHARMACY - ADULT TOTAL PARENTERAL NUTRITION CONSULT NOTE   Pharmacy Consult for TPN Indication: Protein-calorie malnutrition   Patient Measurements: Height: 5' 6"  (167.6 cm) Weight: 110 lb 10.7 oz (50.2 kg) IBW/kg (Calculated) : 63.8 TPN AdjBW (KG): 46 Body mass index is 17.86 kg/m.  Assessment: 62 yo m presenting from [facility] on 4/7 after CPR x 1  Patient has a hx of chronic resp failure with trach, restrictive lung disease, OSA, CHF, ICD<, short bowel syndrome, HTN, osteo, multiple cardiac arrests  [facility] has been on [facility] d/t protein calorie malnutrition and short bowel syndrome.   GI: hx of short bowel syndrome. Has been not acute needs for TPN since admit.  Albumin 2.6 and Prealbumin 10.1 which is < goal of 18-38 mg/dL, pepcid Stool output 2.6 L / 24h, NG output 550 mL/24h  Endo: CBGs < 100s Insulin requirements in the past 24 hours: 0  Lytes: Na 138, K 4.1, Mg 1.9, Cal. 8.9, Phos 4.1 - received 37mq K+ yesterday Renal: Scr Trending higher 3.61>>4.42, BUN 50 - 785 mL>>450 UOP last 24hr Pulm: full vent support Cards: septic shock on pressors Hepatobil: alk phos elevated on admit to 283>223, AST elevated to 128>83 Trigs 87>94  Neuro: no issues ID: presumed sepsis  Cultures neg, continues on meropenem vanc., Andulafungin every 24hr. Fluids:  D5% LR at 757mhr  TPN Access: has PICC and will run TPN through dedicated lumen per team.  TPN start date: PTA  Nutritional Goals (per RD recommendation on 4/8): KCal: 1550 / day Protein: 80 - 90 gm / day Fluid: 1.6 L/day   Current Nutrition: NPO  Plan:  Initiate TPN at 50 mL/hr. Hold 20% lipid emulsion for first 7 days for ICU patients per ASPEN guidelines (Start date 4/11) This TPN provides 60 g of protein, 180 g of dextrose, and no lipids which provides 852 kCals per day, meeting ~50% of patient needs Electrolytes in TPN: Will start with 3543mL sodium, 51m61m of K+, 5meq51mof Cal/mag and  5Mmol/L of phos.  - With worsening renal function, these may need adjusting. Add MVI, trace elements Continue standard SSI and adjust as needed Decrease IVF to 50ml/13mhen TPN starts.   Monitor TPN labs Mon/Thu F/U Transfer back to [facility] [place] Pass[place]., MS Clinical Pharmacist Pager:  [phone] you for allowing pharmacy to be part of this patients care team. 05/28/2017 11:59 AM

## 2017-05-28 NOTE — Progress Notes (Signed)
Pharmacy Antibiotic Note  Melvin Kelley is a 62 y.o. male admitted on 05/24/2017 with sepsis after coding at [facility]. Pharmacy has been consulted for vancomycin, Merrem, and Eraxis dosing.  Assessment: Pt admitted from [facility] following PEA cardiac arrest. Scr continues to worsen 3.6 > 4.4, urine output worsening, WBC improved 9.6 > 6.9, temp 98.3, procalcitonin elevated but in setting of AKI, lactic acid normalized to 1.4.  Based on worsening renal function and urine output, will hold vanc dose tonight, order random vanc level 48 hours after last dose, and decrease Merrem to 500mg  IV Q24H. Receiving Eraxis 100mg  IV Q24H.   Plan: Hold vancomycin dose Random vanc level ordered at 2300 Decrease Merrem to 500mg  IV Q24H Continue Eraxis 100mg  IV Q24H Will continue to monitor renal function and adjust dose if needed Will continue to follow up culture results  Height: 5\' 6"  (167.6 cm) Weight: 110 lb 10.7 oz (50.2 kg) IBW/kg (Calculated) : 63.8  Temp (24hrs), Avg:98.7 F (37.1 C), Min:96.5 F (35.8 C), Max:100.2 F (37.9 C)  Recent Labs  Lab 05/24/17 0435 05/24/17 0642 05/24/17 1719 05/25/17 0449 05/25/17 1030 05/26/17 0435 05/27/17 0501 05/28/17 0408  WBC 32.0*  --   --  21.4*  --  16.2* 9.6 6.9  CREATININE 2.79*  --   --  2.47* 2.71* 3.15* 3.61* 4.42*  LATICACIDVEN 6.2* 1.3 1.4  --   --   --   --   --   VANCORANDOM  --   --   --  15  --   --   --   --     Estimated Creatinine Clearance: 12.6 mL/min (A) (by C-G formula based on SCr of 4.42 mg/dL (H)).    Allergies  Allergen Reactions  . Morphine And Related Shortness Of Breath  . Glipizide Itching  . Levofloxacin Hives  . Penicillins Hives  . Sulfa Antibiotics Hives  . Trazodone And Nefazodone Itching  . Codeine Rash    Antimicrobials this admission: Vancomycin (4/7) >>  Merrem (4/5) >>  Eraxis (4/7) >>  Dose adjustments this admission: Vancomycin 1000mg  x1 (4/7) >> 750mg  x1 (4/8) >> Merrem 500mg  Q12H (4/7) >> Merrem  500mg  Q24H >> Eraxis 200mg  x1 (4/7) >> 100mg  IV Q24H (4/8) >>  Microbiology results: 4/7 BCx: pending (no growth 3 days) 4/7 BCx Fungal: pending (no growth 3 days) 4/7 UCx: negative nitrites, trace leuks, rare bacteria  4/7 Trach aspir: consistent with normal flora 4/7 MRSA PCR: positive 4/6 C. Diff: negative  Thank you for allowing pharmacy to be a part of this patient's care.  Josiah Lobo, PharmD Candidate 05/28/2017 12:51 PM

## 2017-05-28 NOTE — Progress Notes (Addendum)
1915 Bedside shift report. Pt resting on vent, has trach. Pt with TPN infusing. Lines, tubes, gtts checked and verified. Pt turned and repositioned. Fall precautions in place, VSS, WCTM.   2000 Pt assessed, see flow sheet. Pt able to follow simple commands, able to squeeze bilateral hands, nods head when asked if he's in pain. Pt' foley care performed, pad changed, pt repositioned and skin checked. Levo off, WCTM.   2130 Pt given CHG bath, full linen changed. Pt tolerated well. Copious clear, thick, secretions from below trach. VSS, WCTM.   2200 Pt medicated per MAR. VSS.   2315 Pt resting comfortably. NAD.

## 2017-05-28 NOTE — Progress Notes (Signed)
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 Intubated 12/14. Extubated 12/18. Discharged Home.  Admitted at [facility] 12/29-1/11 with AMS, Hypercapnia, and Fungemia with Candida Parapsilosis  Admitted at [facility] 2/26-3/22 s/p Cardiac Arrest in setting of sepsis, respiratory failure  Admitted at [facility] 3/25-4/5 for hypercapnic/hypoxic induced asystolic cardiac arrest with PCO2 >100  On 4/7 patient found pulseless at [facility]. Started CPR and given 1 EPI with return of ROSC. Transferred to [facility] For further management. ABG 7.033/46/426. Crt 2.79. WBC 32.0. LA 6.2   STUDIES:  CXR 4/7 > Improved aeration of the right lung. No new consolidation identified. Stable lines and tubes. Persistent partially visualized distention of bowel. Renal U/S: no hydronephrosis, 1.4 cm R renal cyst ECHO 4/8 >> EF 45-50%, limited study EEG 4/8 >> diffuse background suppression and slowing, no epileptiform activity  CULTURES: Nasal MRSA PCR 4/7 >> positive Blood 4/7 >> no growth to date U/A 4/7 >> neg nitrites, trace leuks, rare bacteria Tracheal aspirate 4/7 >> consistent w/ respiratory flora  ANTIBIOTICS: Vancomycin 4/7 >> Meropenem 4/7 >> Eraxis 4/7 >>   SIGNIFICANT EVENTS: 4/7 > Admitted to 72M from [facility] s/p Cardiac Arrest   LINES/TUBES: PICC RUE Trach   SUBJECTIVE:   Fever overnight of 100.5F. Urine output  decreasing. Off levophed temporarily, but needed to resume overnight. Awake, opening eyes.  VITAL SIGNS: BP 123/63   Pulse 97   Temp 99.2 F (37.3 C) (Oral)   Resp (!) 24   Ht 5\' 6"  (1.676 m)   Wt 110 lb 10.7 oz (50.2 kg)   SpO2 100%   BMI 17.86 kg/m   HEMODYNAMICS:    VENTILATOR SETTINGS: Vent Mode: PRVC FiO2 (%):  [40 %] 40 % Set Rate:  [24 bmp] 24 bmp Vt Set:  [510 mL] 510 mL PEEP:  [5 cmH20] 5 cmH20 Plateau Pressure:  [21 cmH20-24 cmH20] 21 cmH20  INTAKE / OUTPUT: I/O last 3 completed shifts: In: 4023.4 [I.V.:2943.4; NG/GT:200; IV Piggyback:880] Out: 4845 [Urine:945; Emesis/NG output:800; Stool:3100]  PHYSICAL EXAMINATION: General:  Chronically ill cachectic adult male  Neuro: awake, opens eyes HEENT:  Trach in place Cardiovascular:  RRR, no MRG  Lungs:  Coarse upper airway sounds anteriorly Abdomen:  Non-distended Musculoskeletal: thin extremities, contractures all extremities, no edema Skin:  Warm, stage IV sacral wound  LABS:  BMET Recent Labs  Lab 05/26/17 0435 05/27/17 0501 05/28/17 0408  NA 133* 134* 138  K 3.3* 3.9 4.1  CL 90* 91* 96*  CO2 28 28 30   BUN 54* 50* 50*  CREATININE 3.15* 3.61* 4.42*  GLUCOSE 107* 71 74    Electrolytes Recent Labs  Lab 05/26/17 0435 05/27/17 0501 05/28/17 0408  CALCIUM 8.1* 8.7* 8.9  MG 2.0 2.0 1.9  PHOS 2.8 3.4 4.1    CBC Recent Labs  Lab 05/26/17 0435 05/27/17 0501 05/28/17 0408  WBC 16.2* 9.6 6.9  HGB 8.9* 8.5* 8.4*  HCT 26.8* 25.5* 25.7*  PLT 422* 385 321    Coag's Recent  Labs  Lab 05/24/17 0642  APTT 72*  INR 1.98    Sepsis Markers Recent Labs  Lab 05/24/17 0435 05/24/17 0642 05/24/17 1719 05/25/17 0449 05/26/17 0435  LATICACIDVEN 6.2* 1.3 1.4  --   --   PROCALCITON  --  0.89  --  12.37 7.59    ABG Recent Labs  Lab 05/25/17 0430 05/26/17 0450 05/26/17 1019  PHART 7.530* 7.534* 7.501*  PCO2ART 35.8 36.4 38.6  PO2ART 93.9 119* 117.0*    Liver Enzymes Recent Labs   Lab 05/23/17 1344 05/24/17 0435 05/27/17 0501 05/28/17 0408  AST 84* 128*  --  83*  ALT 43 63  --  45  ALKPHOS 259* 283*  --  223*  BILITOT 1.5* 1.3*  --  1.0  ALBUMIN 3.0* 3.0* 2.4* 2.6*    Cardiac Enzymes Recent Labs  Lab 05/24/17 0642  TROPONINI <0.03    Glucose Recent Labs  Lab 05/27/17 0723 05/27/17 1146 05/27/17 1557 05/27/17 1927 05/27/17 2326 05/28/17 0330  GLUCAP 74 89 74 90 87 77    Imaging [place] Ekg Site Rite  Result Date: 05/27/2017 If Site Rite image not attached, placement could not be confirmed due to current cardiac rhythm.    DISCUSSION: 62 year old male with recent hospitalization for acute on chronic hypercarbic/hypoxic respiratory failure with multiple cardiac arrests. Discharged to [facility] on 4/5. 4/7 found pulseless, started CPR and given 1 EPI with return of ROSC. Transferred to [facility] ICU on 4/7.  ASSESSMENT / PLAN:  PULMONARY A: Acute on Chronic Hypoxic/Hypercarbic Respiratory Failure s/p Tracheostomy/Vent Dependent  H/O OSA, Restrictive lung disease P:   Continue Vent Support Monitor Intermittent ABG/CXR VAP Bundle  Scheduled Duoneb  Pulmonary Hygiene   CARDIOVASCULAR A:  Cardiac Arrest (Presumed Asystole/PEA)  Presumed Septic Shock with unknown etiology > with stage IV sacral wound, recent fungemia Chronic Systolic Heart Failure (EF 30-35) s/p ICD  H/O DVT  Cardiac Arrest 2/26, 3/25, 4/7  P:  Cardiac Monitoring Wean Levophed to Maintain MAP > 65  RENAL A:   Anion Gap Metabolic Acidosis  Lactic Acidosis in setting of hypoperfusion vs sepsis  LA 6.2 >> 1.3, 1.4 Acute Kidney Injury Renal U/S w/o hydronephrosis Hypokalemia - resolved P:   Trend BMP/UOP/I&Os Replace electrolytes as indicated   GASTROINTESTINAL A:   Severe Protein Calorie Malnutrition on TPN @ [facility] H/O Crohn's  -Colon Resection with Ileostomy 2013 -Ileostomy Reversal/TakeDown 2013 P:   NPO Pepcid Resume TPN  HEMATOLOGIC A:   Anemia  of Chronic Disease  -H/O DVT, did not tolerate anticoagulation due to anemia - recent U/S with no DVT   P:  Trend CBC  Maintain Hbg >7   INFECTIOUS A:  Presumed Septic Shock with unknown etiology > with stage IV sacral wound, recent fungemia H/O Osteomyelitis, Recent Treatment with Candida Parapsilosis Fungemia (suspected due to PICC)  -Treated with Fluconazole (1/2-1/14)  P:   Trend Fever and WBC curve Vancomycin, Meropenem, Eraxis Will consider d/c Vanc with worsening renal fxn (last dose 4/9) F/u cultures  ENDOCRINE A:   DM > Documented Hypoglycemia events  Hypothyroidism    P:   Trend Glucose  SSI  Levothyroxine 44 mcg daily per tube  NEUROLOGIC A:   Metabolic Encephalopathy vs Anoxia  P:   RASS goal: 0/-1 PRN Versed/Fentanyl    FAMILY  - Updates: Per Care Everywhere, patient has multiple family members who are having a hard time agreeing on code status - Ethics was involved at OSH.   Plan to  transfer back to [facility] - insurance to approve per Dr. Reginal Lutes peer-to-peer conversation on 4/10.   - Palliative Care meeting 4/8: Continue current care, ongoing discussions to be continued  Darreld Mclean, MD Internal Medicine PGY-3

## 2017-05-28 NOTE — Progress Notes (Signed)
I had a long discussion with patient's brother and sister-in-law at bedside regarding his current plan of care and update on current clinical status. They are aware of his multiple cardiac arrests, inability to communicate, inability to feed self, requirement for pressor support, severe contractures, sacral wound, and inability to breathe without ventilatory support. I discussed his worsening renal function and decreased urine output. I have told them that dialysis would not be an ideal option for him given his multiple comorbidities and we will not offer dialysis at this time. They are understanding. With regards to his goals of care, the family are wanting to continue current care as a FULL CODE and are hoping for a "miracle" for improvement in functional status. They are agreement to transfer back to [facility] when medically ready.  Darreld Mclean, MD Internal Medicine PGY-3

## 2017-05-29 ENCOUNTER — Inpatient Hospital Stay
Admission: AD | Admit: 2017-05-29 | Discharge: 2017-09-17 | Disposition: E | Payer: Medicare Other | Source: Ambulatory Visit | Attending: Internal Medicine | Admitting: Internal Medicine

## 2017-05-29 ENCOUNTER — Other Ambulatory Visit (HOSPITAL_COMMUNITY): Payer: Self-pay

## 2017-05-29 DIAGNOSIS — G4733 Obstructive sleep apnea (adult) (pediatric): Secondary | ICD-10-CM | POA: Diagnosis present

## 2017-05-29 DIAGNOSIS — T827XXA Infection and inflammatory reaction due to other cardiac and vascular devices, implants and grafts, initial encounter: Secondary | ICD-10-CM

## 2017-05-29 DIAGNOSIS — J189 Pneumonia, unspecified organism: Secondary | ICD-10-CM

## 2017-05-29 DIAGNOSIS — T8182XA Emphysema (subcutaneous) resulting from a procedure, initial encounter: Secondary | ICD-10-CM

## 2017-05-29 DIAGNOSIS — N179 Acute kidney failure, unspecified: Secondary | ICD-10-CM | POA: Diagnosis present

## 2017-05-29 DIAGNOSIS — J939 Pneumothorax, unspecified: Secondary | ICD-10-CM

## 2017-05-29 DIAGNOSIS — J9620 Acute and chronic respiratory failure, unspecified whether with hypoxia or hypercapnia: Secondary | ICD-10-CM | POA: Diagnosis present

## 2017-05-29 DIAGNOSIS — T797XXA Traumatic subcutaneous emphysema, initial encounter: Secondary | ICD-10-CM

## 2017-05-29 DIAGNOSIS — K567 Ileus, unspecified: Secondary | ICD-10-CM

## 2017-05-29 DIAGNOSIS — T17408A Unspecified foreign body in trachea causing other injury, initial encounter: Secondary | ICD-10-CM

## 2017-05-29 DIAGNOSIS — Z930 Tracheostomy status: Secondary | ICD-10-CM

## 2017-05-29 DIAGNOSIS — E162 Hypoglycemia, unspecified: Secondary | ICD-10-CM

## 2017-05-29 DIAGNOSIS — Z4659 Encounter for fitting and adjustment of other gastrointestinal appliance and device: Secondary | ICD-10-CM

## 2017-05-29 DIAGNOSIS — R140 Abdominal distension (gaseous): Secondary | ICD-10-CM

## 2017-05-29 DIAGNOSIS — T85598A Other mechanical complication of other gastrointestinal prosthetic devices, implants and grafts, initial encounter: Secondary | ICD-10-CM

## 2017-05-29 DIAGNOSIS — Z452 Encounter for adjustment and management of vascular access device: Secondary | ICD-10-CM

## 2017-05-29 DIAGNOSIS — Z95828 Presence of other vascular implants and grafts: Secondary | ICD-10-CM

## 2017-05-29 DIAGNOSIS — R1915 Other abnormal bowel sounds: Secondary | ICD-10-CM

## 2017-05-29 DIAGNOSIS — Z0189 Encounter for other specified special examinations: Secondary | ICD-10-CM

## 2017-05-29 DIAGNOSIS — I509 Heart failure, unspecified: Secondary | ICD-10-CM

## 2017-05-29 DIAGNOSIS — J9690 Respiratory failure, unspecified, unspecified whether with hypoxia or hypercapnia: Secondary | ICD-10-CM

## 2017-05-29 DIAGNOSIS — I469 Cardiac arrest, cause unspecified: Secondary | ICD-10-CM | POA: Diagnosis present

## 2017-05-29 DIAGNOSIS — Z992 Dependence on renal dialysis: Secondary | ICD-10-CM

## 2017-05-29 DIAGNOSIS — R221 Localized swelling, mass and lump, neck: Secondary | ICD-10-CM

## 2017-05-29 LAB — GLUCOSE, CAPILLARY
GLUCOSE-CAPILLARY: 138 mg/dL — AB (ref 65–99)
GLUCOSE-CAPILLARY: 107 mg/dL — AB (ref 65–99)
GLUCOSE-CAPILLARY: 104 mg/dL — AB (ref 65–99)
Glucose-Capillary: 118 mg/dL — ABNORMAL HIGH (ref 65–99)

## 2017-05-29 LAB — CBC
HCT: 25 % — ABNORMAL LOW (ref 39.0–52.0)
HEMOGLOBIN: 8 g/dL — AB (ref 13.0–17.0)
MCH: 27.1 pg (ref 26.0–34.0)
MCHC: 32 g/dL (ref 30.0–36.0)
MCV: 84.7 fL (ref 78.0–100.0)
Platelets: 223 10*3/uL (ref 150–400)
RBC: 2.95 MIL/uL — AB (ref 4.22–5.81)
RDW: 18 % — ABNORMAL HIGH (ref 11.5–15.5)
WBC: 4.5 10*3/uL (ref 4.0–10.5)

## 2017-05-29 LAB — COMPREHENSIVE METABOLIC PANEL
ALT: 28 U/L (ref 17–63)
AST: 56 U/L — AB (ref 15–41)
Albumin: 2.7 g/dL — ABNORMAL LOW (ref 3.5–5.0)
Alkaline Phosphatase: 203 U/L — ABNORMAL HIGH (ref 38–126)
Anion gap: 13 (ref 5–15)
BUN: 56 mg/dL — AB (ref 6–20)
CHLORIDE: 95 mmol/L — AB (ref 101–111)
CO2: 28 mmol/L (ref 22–32)
Calcium: 9.2 mg/dL (ref 8.9–10.3)
Creatinine, Ser: 4.48 mg/dL — ABNORMAL HIGH (ref 0.61–1.24)
GFR, EST AFRICAN AMERICAN: 15 mL/min — AB (ref >60)
GFR, EST NON AFRICAN AMERICAN: 13 mL/min — AB (ref >60)
Glucose, Bld: 117 mg/dL — ABNORMAL HIGH (ref 65–99)
POTASSIUM: 3.7 mmol/L (ref 3.5–5.1)
Sodium: 136 mmol/L (ref 135–145)
Total Bilirubin: 1.1 mg/dL (ref 0.3–1.2)
Total Protein: 7.7 g/dL (ref 6.5–8.1)

## 2017-05-29 LAB — MAGNESIUM
MAGNESIUM: 1.8 mg/dL (ref 1.7–2.4)
MAGNESIUM: 1.9 mg/dL (ref 1.7–2.4)

## 2017-05-29 LAB — RENAL FUNCTION PANEL
ANION GAP: 11 (ref 5–15)
Albumin: 2.5 g/dL — ABNORMAL LOW (ref 3.5–5.0)
BUN: 51 mg/dL — ABNORMAL HIGH (ref 6–20)
CHLORIDE: 96 mmol/L — AB (ref 101–111)
CO2: 30 mmol/L (ref 22–32)
Calcium: 9 mg/dL (ref 8.9–10.3)
Creatinine, Ser: 4.45 mg/dL — ABNORMAL HIGH (ref 0.61–1.24)
GFR calc non Af Amer: 13 mL/min — ABNORMAL LOW (ref >60)
GFR, EST AFRICAN AMERICAN: 15 mL/min — AB (ref >60)
Glucose, Bld: 101 mg/dL — ABNORMAL HIGH (ref 65–99)
Phosphorus: 4.9 mg/dL — ABNORMAL HIGH (ref 2.5–4.6)
Potassium: 3.7 mmol/L (ref 3.5–5.1)
Sodium: 137 mmol/L (ref 135–145)

## 2017-05-29 LAB — POCT I-STAT 3, ART BLOOD GAS (G3+)
Acid-Base Excess: 8 mmol/L — ABNORMAL HIGH (ref 0.0–2.0)
BICARBONATE: 33.1 mmol/L — AB (ref 20.0–28.0)
O2 Saturation: 99 %
PCO2 ART: 49.9 mmHg — AB (ref 32.0–48.0)
Patient temperature: 98.6
TCO2: 35 mmol/L — AB (ref 22–32)
pH, Arterial: 7.429 (ref 7.350–7.450)
pO2, Arterial: 147 mmHg — ABNORMAL HIGH (ref 83.0–108.0)

## 2017-05-29 LAB — CULTURE, BLOOD (ROUTINE X 2)
CULTURE: NO GROWTH
CULTURE: NO GROWTH

## 2017-05-29 LAB — VANCOMYCIN, RANDOM: Vancomycin Rm: 27

## 2017-05-29 LAB — TRIGLYCERIDES: TRIGLYCERIDES: 104 mg/dL (ref <150)

## 2017-05-29 LAB — PROCALCITONIN: Procalcitonin: 1.93 ng/mL

## 2017-05-29 LAB — PHOSPHORUS: PHOSPHORUS: 4.4 mg/dL (ref 2.5–4.6)

## 2017-05-29 MED ORDER — FAMOTIDINE 40 MG/5ML PO SUSR: 20.0000 mg | Freq: Every day | ORAL | 0 refills | Status: AC

## 2017-05-29 MED ORDER — SODIUM CHLORIDE 0.9 % IV SOLN: 500.0000 mg | INTRAVENOUS | Status: AC

## 2017-05-29 NOTE — Progress Notes (Signed)
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 Intubated 12/14. Extubated 12/18. Discharged Home.  Admitted at [facility] 12/29-1/11 with AMS, Hypercapnia, and Fungemia with Candida Parapsilosis  Admitted at [facility] 2/26-3/22 s/p Cardiac Arrest in setting of sepsis, respiratory failure  Admitted at [facility] 3/25-4/5 for hypercapnic/hypoxic induced asystolic cardiac arrest with PCO2 >100  On 4/7 patient found pulseless at [facility]. Started CPR and given 1 EPI with return of ROSC. Transferred to [facility] For further management. ABG 7.033/46/426. Crt 2.79. WBC 32.0. LA 6.2   STUDIES:  CXR 4/7 > Improved aeration of the right lung. No new consolidation identified. Stable lines and tubes. Persistent partially visualized distention of bowel. Renal U/S: no hydronephrosis, 1.4 cm R renal cyst ECHO 4/8 >> EF 45-50%, limited study EEG 4/8 >> diffuse background suppression and slowing, no epileptiform activity  CULTURES: Nasal MRSA PCR 4/7 >> positive Blood 4/7 >> no growth 4 days U/A 4/7 >> neg nitrites, trace leuks, rare bacteria Tracheal aspirate 4/7 >> consistent w/ respiratory flora  ANTIBIOTICS: Vancomycin 4/7 >> Meropenem 4/7 >> Eraxis 4/7 >>   SIGNIFICANT EVENTS: 4/7 > Ad[facility] to [facility] from [facility] s/p Cardiac Arrest  4/11 > TPN resumed  LINES/TUBES: PICC RUE - placed at [facility] ~ 3/25 Trach   SUBJECTIVE:   Weaned  off levophed last night. Much more awake and interactive, attempting to speak. Daughter Sonny Masters is at bedside.  VITAL SIGNS: BP 103/75   Pulse 76   Temp 98.1 F (36.7 C) (Oral)   Resp 20   Ht 5\' 6"  (1.676 m)   Wt 107 lb 2.3 oz (48.6 kg)   SpO2 100%   BMI 17.29 kg/m   HEMODYNAMICS:    VENTILATOR SETTINGS: Vent Mode: PRVC FiO2 (%):  [30 %-40 %] 30 % Set Rate:  [18 bmp-24 bmp] 18 bmp Vt Set:  [510 mL] 510 mL PEEP:  [5 cmH20] 5 cmH20 Plateau Pressure:  [21 cmH20-27 cmH20] 22 cmH20  INTAKE / OUTPUT: I/O last 3 completed shifts: In: 3332.8 [I.V.:2842.8; NG/GT:160; IV Piggyback:330] Out: 4110 [Urine:1010; Emesis/NG output:300; Stool:2800]  PHYSICAL EXAMINATION: General:  Chronically ill cachectic adult male  Neuro: awake, alert, opens eyes, turns head and attempts to speak, moving extremities spontaneously HEENT:  Trach in place Cardiovascular:  RRR, no MRG  Lungs:  Coarse upper airway sounds anteriorly Abdomen:  Non-distended Musculoskeletal: thin extremities, contractures all extremities, no edema Skin:  Warm, stage IV sacral wound  LABS:  BMET Recent Labs  Lab 05/27/17 0501 05/28/17 0408 06/02/2017 0341  NA 134* 138 137  K 3.9 4.1 3.7  CL 91* 96* 96*  CO2 28 30 30   BUN 50* 50* 51*  CREATININE 3.61* 4.42* 4.45*  GLUCOSE 71 74 101*    Electrolytes Recent Labs  Lab 05/27/17 0501 05/28/17 0408 05/18/2017 0341  CALCIUM 8.7* 8.9 9.0  MG 2.0 1.9 1.8  PHOS 3.4 4.1 4.9*    CBC Recent Labs  Lab 05/27/17 0501 05/28/17 0408 06/05/2017 0341  WBC 9.6 6.9  4.5  HGB 8.5* 8.4* 8.0*  HCT 25.5* 25.7* 25.0*  PLT 385 321 223    Coag's Recent Labs  Lab 05/24/17 0642  APTT 72*  INR 1.98    Sepsis Markers Recent Labs  Lab 05/24/17 0435 05/24/17 0642 05/24/17 1719 05/25/17 0449 05/26/17 0435  LATICACIDVEN 6.2* 1.3 1.4  --   --   PROCALCITON  --  0.89  --  12.37 7.59    ABG Recent Labs  Lab 05/26/17 0450 05/26/17 1019 06/11/2017 0318  PHART 7.534*  7.501* 7.429  PCO2ART 36.4 38.6 49.9*  PO2ART 119* 117.0* 147.0*    Liver Enzymes Recent Labs  Lab 05/23/17 1344 05/24/17 0435 05/27/17 0501 05/28/17 0408 05/28/2017 0341  AST 84* 128*  --  83*  --   ALT 43 63  --  45  --   ALKPHOS 259* 283*  --  223*  --   BILITOT 1.5* 1.3*  --  1.0  --   ALBUMIN 3.0* 3.0* 2.4* 2.6* 2.5*    Cardiac Enzymes Recent Labs  Lab 05/24/17 0642  TROPONINI <0.03    Glucose Recent Labs  Lab 05/28/17 0713 05/28/17 1211 05/28/17 1552 05/28/17 2334 05/28/17 2336 05/26/2017 0416  GLUCAP 97 90 77 48* 73 118*    Imaging No results found.   DISCUSSION: 62 year old male with recent hospitalization for acute on chronic hypercarbic/hypoxic respiratory failure with multiple cardiac arrests. Discharged to [facility] on 4/5. 4/7 found pulseless, started CPR and given 1 EPI with return of ROSC. Transferred to [facility] ICU on 4/7 with septic/cardiogenic shock.  ASSESSMENT / PLAN:  PULMONARY A: Acute on Chronic Hypoxic/Hypercarbic Respiratory Failure s/p Tracheostomy/Vent Dependent  H/O OSA, Unspecified Restrictive lung disease P:   Continue Vent Support Monitor Intermittent ABG/CXR VAP Bundle  Scheduled Duoneb  Pulmonary Hygiene   CARDIOVASCULAR A:  Cardiac Arrest (Presumed Asystole/PEA)  Presumed Septic Shock with unknown etiology > with stage IV sacral wound, recent fungemia Chronic Systolic Heart Failure (EF 30-35) s/p ICD  H/O DVT  Cardiac Arrest 2/26, 3/25, 4/7  P:  Cardiac Monitoring Weaned off Levophed 4/11  RENAL A:   Anion Gap Metabolic Acidosis - improved Lactic Acidosis in setting of hypoperfusion vs sepsis - improved Acute Kidney Injury - Cr plateauing @ 4.45 Renal U/S w/o hydronephrosis Hypokalemia - resolved P:   Trend BMP/UOP/I&Os Replace electrolytes as indicated Not dialysis candidate - discussed with family 4/11   GASTROINTESTINAL A:   Severe Protein Calorie Malnutrition on TPN @ [facility] H/O Crohn's   Chronic Ileus with distended colon -Colon Resection with Ileostomy 2013 -Ileostomy Reversal/TakeDown 2013 P:   NPO Pepcid TPN resumed 4/11  HEMATOLOGIC A:   Anemia of Chronic Disease  -H/O DVT, did not tolerate anticoagulation due to anemia - recent U/S with no DVT   P:  Trend CBC  Maintain Hbg >7   INFECTIOUS A:  Presumed Septic Shock with unknown etiology > with stage IV sacral wound, recent fungemia H/O Osteomyelitis, Recent Treatment with Candida Parapsilosis Fungemia (suspected due to PICC)  -Treated with Fluconazole (1/2-1/14)  P:   Trend Fever and WBC curve Cont Vancomycin - likely 7 day course Meropenem, Eraxis - likely 5 day course F/u cultures  ENDOCRINE A:   DM > Documented Hypoglycemia events  Hypothyroidism    P:   Trend Glucose  SSI  Levothyroxine 44 mcg daily per tube D/c D5+LR now that TPN started  NEUROLOGIC A:   Metabolic Encephalopathy vs Anoxia  P:   RASS goal: 0/-1  PRN Versed/Fentanyl    FAMILY  - Updates: Per Care Everywhere, patient has multiple family members who are having a hard time agreeing on code status - Ethics was involved at OSH.   Brother and sister-in-law updated 4/11. Daughter, Candace at bedside 4/12 and wants to discuss changing code status to no CPR.   - Palliative Care meeting 4/8: Continue current care, ongoing discussions to be continued  Darreld Mclean, MD Internal Medicine PGY-3

## 2017-05-29 NOTE — Progress Notes (Addendum)
1791 RN to room, pt c/o being "hot", warming blanket removed, pt c/o pain, medicated per MAR. Pt repositioned. WCTM.   0230 Pt resting comfortably. NAD.  0500 Pt's dgt at bedside. Updated with POC. Spoke to dgt about pt's code status and options, such as no CPR (limited code). Pt's dgt open to idea and will speak to sisters and get back to RN/MD about decision. Pt given CHG bath, linen changed. VSS. Pt able to follow simple commands, denies pain. Fall precautions in place, [facility].

## 2017-05-29 NOTE — Discharge Instructions (Signed)
Continue vent support - wean as able. Continue TPN and monitor renal function panel, magnesium, and CBC. Continue off-loading and wound care for sacral ulcer.  Complete antibiotics - Meropenem last dose on 05/30/17.  Continue family discussions regarding goals of care.

## 2017-05-29 NOTE — Progress Notes (Signed)
I called patient's mother again and she is going to contact the children with [facility] transfer and room #5E01

## 2017-05-29 NOTE — Progress Notes (Signed)
Patient transferred to 5 E01 via bed with monitor .he tolerated well and is smiling

## 2017-05-29 NOTE — Progress Notes (Signed)
RT NOTE: RT transported patient on ventilator from room 2M04 to [facility] 5[facility]01 with no complications. Patient tolerated well. Vitals are stable and report was given to staff.

## 2017-05-29 NOTE — Progress Notes (Signed)
I have called pt's daughter Aram Beecham twice and message left to inform her of patient's transfer

## 2017-05-29 NOTE — Care Management (Signed)
06/01/2017  Pt to discharge to [facility] today.  Family made aware by unit staff  05/28/17 Update:  Per attending pt is not ready for discharge back to [facility] today  Pt was originally denied for [facility] [employer] - however decision was overturned post peer to peer.  [facility] has already gained consent from family to move forward with discharge back to [facility] today.    05/26/17 Update:  CM spoke with pts brother and mom Ola - both are in agreement for pt to discharge back to [facility] once insurance [employer] is obtained.  Insurance auth initated  Pt is from [facility].  Pt is chronic trach/vent.  CM received consult for pt to return to [facility].  CM informed by Palliative NP that POC is pts mom Ola.  CM left voicemail for mom requesting call back   [facility] informed pt ready for discharge - facility will pursue insur auth once CM discusses discharge plan to return with [facility] with pts mom Ola

## 2017-05-29 NOTE — Progress Notes (Signed)
Report called to [facility] on 5 E01 and patient's mother called with transfer information

## 2017-05-29 NOTE — Progress Notes (Signed)
PHARMACY - ADULT TOTAL PARENTERAL NUTRITION CONSULT NOTE   Pharmacy Consult for TPN Indication: Protein-calorie malnutrition   Patient Measurements: Height: _0  (167.6 cm) Weight: 110 lb 10.7 oz (50.2 kg) IBW/kg (Calculated) : 63.8 TPN AdjBW (KG): 46 Body mass index is 17.86 kg/m.  Assessment: 62 yo m presenting from [facility] on 4/7 after CPR  Patient has a hx of chronic resp failure with trach, restrictive lung disease, OSA, CHF, ICD<, short bowel syndrome, HTN, osteo, multiple cardiac arrests  [facility] has [facility] d/t protein calorie malnutrition and short bowel syndrome.  GI: Hx of short bowel syndrome. On TPN PTA. Albumin 2.5, Prealbumin 10.1 (4/11). Stool output/24h: 2300 ml. NGO/24h: 300 cc. On pepcid Endo: CBGs/12h since starting TPN: 48-118. SSI/24h: 1 unit >> led to low CBG of 48 Lytes: Na 137, Cl 96, K 3.7 << 4.1, Mg 1.8 - will give Mg 1g for replacement, CoCa~10, Phos 4.9, HCO3 30 Renal: AKI with SCr up to 4.45 << 4.42, CrCl<15 ml/min, UOP 0.6 ml/kg/hr. BUN 51 Pulm: Chronic trach Cards: s/p several PEA arrests - most recently 4/7. BP soft - weaned off pressors. HR wnl.  Hepatobil: AST 83 << 128, ALT wnl, Alk Phos 223, TBili wnl Neuro: Able to follow simple commands, appears more interactive ID:  Eraxis D#6 + Meropenem + Vanc for r/o sepsis. Tmax/24h: 99.6, WBC wnl Best Practices:  TPN Access: Double lumen PICC - TPN to run through dedicated lumen TPN start date:  PTA  Nutritional Goals (per RD recommendation on 4/10): KCal: 1550/day Protein: 80 - 90 gm/day Fluid: 1.6 L/day  Current Nutrition:  TPN at 50 ml/hr, no lipids  Plan Noted plans to discharge to [facility] today and bed is available Will hold off on making TPN for this patient today   Thank you for allowing pharmacy to be a part of this patient's care.  Alycia Rossetti, PharmD, BCPS Clinical Pharmacist Pager: [phone] Clinical phone for 06/13/2017 from 7a-3:30p:  [phone] If after 3:30p, please call main pharmacy at: [phone] 05/21/2017 12:04 PM

## 2017-05-30 LAB — CBC WITH DIFFERENTIAL/PLATELET
BASOS ABS: 0 10*3/uL (ref 0.0–0.1)
Basophils Relative: 0 %
Eosinophils Absolute: 0.2 10*3/uL (ref 0.0–0.7)
Eosinophils Relative: 3 %
HCT: 28 % — ABNORMAL LOW (ref 39.0–52.0)
Hemoglobin: 9.2 g/dL — ABNORMAL LOW (ref 13.0–17.0)
LYMPHS ABS: 1.9 10*3/uL (ref 0.7–4.0)
Lymphocytes Relative: 26 %
MCH: 26.6 pg (ref 26.0–34.0)
MCHC: 32.9 g/dL (ref 30.0–36.0)
MCV: 80.9 fL (ref 78.0–100.0)
MONOS PCT: 9 %
Monocytes Absolute: 0.6 10*3/uL (ref 0.1–1.0)
NEUTROS ABS: 4.5 10*3/uL (ref 1.7–7.7)
Neutrophils Relative %: 62 %
PLATELETS: 213 10*3/uL (ref 150–400)
RBC: 3.46 MIL/uL — ABNORMAL LOW (ref 4.22–5.81)
RDW: 17 % — AB (ref 11.5–15.5)
WBC: 7.2 10*3/uL (ref 4.0–10.5)

## 2017-05-30 LAB — TRIGLYCERIDES: Triglycerides: 101 mg/dL (ref <150)

## 2017-05-30 LAB — BASIC METABOLIC PANEL
Anion gap: 15 (ref 5–15)
BUN: 66 mg/dL — AB (ref 6–20)
CO2: 25 mmol/L (ref 22–32)
CREATININE: 4.36 mg/dL — AB (ref 0.61–1.24)
Calcium: 9.3 mg/dL (ref 8.9–10.3)
Chloride: 92 mmol/L — ABNORMAL LOW (ref 101–111)
GFR calc Af Amer: 16 mL/min — ABNORMAL LOW (ref >60)
GFR, EST NON AFRICAN AMERICAN: 13 mL/min — AB (ref >60)
GLUCOSE: 115 mg/dL — AB (ref 65–99)
POTASSIUM: 3.7 mmol/L (ref 3.5–5.1)
Sodium: 132 mmol/L — ABNORMAL LOW (ref 135–145)

## 2017-05-31 ENCOUNTER — Other Ambulatory Visit (HOSPITAL_COMMUNITY): Payer: Self-pay

## 2017-05-31 ENCOUNTER — Encounter: Payer: Self-pay | Admitting: Internal Medicine

## 2017-05-31 DIAGNOSIS — Z930 Tracheostomy status: Secondary | ICD-10-CM

## 2017-05-31 LAB — FUNGUS CULTURE, BLOOD: CULTURE: NO GROWTH

## 2017-05-31 NOTE — Consult Note (Signed)
Pulmonary Critical Care Medicine [facility] PULMONARY SERVICE  Date of Service: 05/31/2017  PULMONARY CONSULT   Melvin Kelley  ZOX:[id number]  DOB: 10-08-1955   DOA: 06/09/2017  Referring Physician: Carron Curie, MD  HPI: Melvin Kelley is a 62 y.o. male seen for follow up of Acute on Chronic Respiratory Failure.  Patient has multiple medical problems presented to the hospital with acute pulseless electrical activity cardiac arrest.  Patient was transferred to the ICU immediately.  He was having equal pupils at presentation with a positive gag but no cough noted.  There was no significant movement noted as per the attending notes.  Patient was felt to be septic at the time of presentation and was noted to be hypothermic.  Patient started on broad-spectrum antibiotics to include vancomycin and meropenem and Eraxis.  In addition he had acute renal failure with elevated creatinine.  Since his admission to the ICU he has had no further arrhythmias noted.  As far as the renal function is concerned there was some improvement noted.  Patient also was grossly malnourished  Review of Systems:  ROS performed and is unremarkable other than noted above.  Past Medical History:  Diagnosis Date  . AICD (automatic cardioverter/defibrillator) present   . Anemia   . Anoxic encephalopathy (HCC)   . Asthma   . Candidemia (HCC)   . Cardiac arrest [facility])    Multiple cardiac arrests at [facility] 05/11/17, 03/2017  . CHF (congestive heart failure) (HCC)    EF 30%  . Crohn disease (HCC)   . Delirium   . DVT of upper extremity (deep vein thrombosis) (HCC)   . Hypoglycemia   . Malnutrition (HCC)    on long term TPN  . MDRO (multiple drug resistant organisms) resistance    Klebsiella and Citrobacter in Urine  . OSA (obstructive sleep apnea)   . Restrictive lung disease   . Sacral decubitus ulcer   . Short gut syndrome     History reviewed. No pertinent surgical history.  Social History:     has no tobacco, alcohol, and drug history on file.  Family History: Non-Contributory to the present illness  Allergies  Allergen Reactions  . Levofloxacin Hives  . Morphine Shortness Of Breath  . Morphine And Related Shortness Of Breath  . Penicillins Hives and Rash    Has tolerated ceftriaxone and cefepime in previous admissions Has tolerated ceftriaxone and cefepime in previous admissions   . Sulfa Antibiotics Hives and Rash  . Oxycodone-Acetaminophen     Other reaction(s): Confusion  . Tramadol Diarrhea  . Trazodone And Nefazodone Itching  . Codeine Rash  . Glipizide Itching and Nausea Only  . Midazolam     confusion  . Trazodone Itching    Medications: Reviewed on Rounds  Physical Exam:  Vitals: Temperature 96.2 pulse 80 respiratory rate 18 blood pressure 97/53 saturations 98%  Ventilator Settings mode of ventilation assist control FiO2 30% tidal volume 515 PEEP 5  . General: Comfortable at this time . Eyes: Grossly normal lids, irises & conjunctiva . ENT: grossly tongue is normal . Neck: no obvious mass . Cardiovascular: S1-S2 normal no gallop . Respiratory: No rhonchi expansion is equal . Abdomen: Soft nontender . Skin: no rash seen on limited exam . Musculoskeletal: not rigid . Psychiatric:unable to assess . Neurologic: no seizure no involuntary movements         Labs on Admission:  Basic Metabolic Panel: Recent Labs  Lab 05/26/17 0435 05/27/17 0501 05/28/17 0408 06/01/2017 0341 05/30/2017  1852 05/30/17 1104  NA 133* 134* 138 137 136 132*  K 3.3* 3.9 4.1 3.7 3.7 3.7  CL 90* 91* 96* 96* 95* 92*  CO2 28 28 30 30 28 25   GLUCOSE 107* 71 74 101* 117* 115*  BUN 54* 50* 50* 51* 56* 66*  CREATININE 3.15* 3.61* 4.42* 4.45* 4.48* 4.36*  CALCIUM 8.1* 8.7* 8.9 9.0 9.2 9.3  MG 2.0 2.0 1.9 1.8 1.9  --   PHOS 2.8 3.4 4.1 4.9* 4.4  --     Liver Function Tests: Recent Labs  Lab 05/27/17 0501 05/28/17 0408 06/11/2017 0341 06/11/2017 1852  AST  --  83*  --   56*  ALT  --  45  --  28  ALKPHOS  --  223*  --  203*  BILITOT  --  1.0  --  1.1  PROT  --  6.9  --  7.7  ALBUMIN 2.4* 2.6* 2.5* 2.7*   No results for input(s): LIPASE, AMYLASE in the last 168 hours. No results for input(s): AMMONIA in the last 168 hours.  CBC: Recent Labs  Lab 05/26/17 0435 05/27/17 0501 05/28/17 0408 06/11/2017 0341 05/30/17 1104  WBC 16.2* 9.6 6.9 4.5 7.2  NEUTROABS  --   --   --   --  4.5  HGB 8.9* 8.5* 8.4* 8.0* 9.2*  HCT 26.8* 25.5* 25.7* 25.0* 28.0*  MCV 80.0 81.2 82.6 84.7 80.9  PLT 422* 385 321 223 213    Cardiac Enzymes: No results for input(s): CKTOTAL, CKMB, CKMBINDEX, TROPONINI in the last 168 hours.  BNP (last 3 results) No results for input(s): BNP in the last 8760 hours.  ProBNP (last 3 results) No results for input(s): PROBNP in the last 8760 hours.   Radiological Exams on Admission: Dg Chest Port 1 View  Result Date: 05/25/2017 CLINICAL DATA:  Tracheostomy present.  Cardiac arrhythmia EXAM: PORTABLE CHEST 1 VIEW COMPARISON:  May 28, 2017 FINDINGS: Tracheostomy catheter tip is 5.1 cm above the carina. Nasogastric tube tip and side port are in the stomach. Pacemaker leads are attached to the right atrium, stable. Central catheter tip is in the superior vena cava. No pneumothorax. There is atelectatic change in the right lower lobe. Lungs elsewhere are clear. Heart size and pulmonary vascularity are normal. No adenopathy. No bone lesions. IMPRESSION: Tube and catheter positions as described without evident pneumothorax. Atelectasis right lower lobe. Lungs elsewhere are clear. Heart size within normal limits. Electronically Signed   By: Bretta Bang III M.D.   On: 05/22/2017 17:49   Dg Chest Port 1 View  Result Date: 05/28/2017 CLINICAL DATA:  Follow-up tracheostomy EXAM: PORTABLE CHEST 1 VIEW COMPARISON:  05/27/2017 FINDINGS: Tracheostomy tube, nasogastric catheter and right-sided PICC line are again seen in satisfactory position. Some  coiling of the nasogastric catheter is noted in the neck. This could be slightly withdrawn. Defibrillator is again seen and stable. The lungs are again well aerated with some right basilar atelectasis stable from the recent exam. No new focal abnormality is noted. Stable air in the colon is noted consistent with previous ileus. IMPRESSION: Overall stable appearance of the chest when compared with the prior exam. Electronically Signed   By: Alcide Clever M.D.   On: 05/28/2017 09:11   Dg Abd Portable 1v  Result Date: 05/31/2017 CLINICAL DATA:  Ileus EXAM: PORTABLE ABDOMEN - 1 VIEW COMPARISON:  Plain films of the abdomen dated 05/25/2017 and 05/22/2017. FINDINGS: Diffuse marked gaseous bowel distension persists, not appreciably changed compared to  the previous exams. Enteric tube is stable in position with tip directed towards the stomach fundus. Surgical changes again noted within the central abdomen and LEFT upper quadrant. IMPRESSION: Stable appearance of the bowel, with diffuse marked gaseous bowel distension, compatible with given history of ileus. Electronically Signed   By: Bary Richard M.D.   On: 05/31/2017 09:17   Dg Abd Portable 1v  Result Date: 06/02/17 CLINICAL DATA:  Ileus EXAM: PORTABLE ABDOMEN - 1 VIEW COMPARISON:  None. FINDINGS: Nasogastric tube tip and side port are in the stomach. There is diffuse bowel dilatation without appreciable air-fluid level. No free air evident on this supine examination. Areas of postoperative change noted bilaterally. IMPRESSION: Nasogastric tube tip and side port in stomach. Diffuse bowel dilatation in a pattern suggestive of ileus. A degree of distal bowel obstruction cannot be excluded radiographically. No free air is seen on this supine examination. Electronically Signed   By: Bretta Bang III M.D.   On: 02-Jun-2017 17:50    Assessment/Plan Active Problems:   Cardiac arrest (HCC)   Acute on chronic respiratory failure (HCC)   DNR (do not  resuscitate) discussion   AKI (acute kidney injury) (HCC)   CHF (congestive heart failure) (HCC)   Obstructive sleep apnea   Acute kidney injury (HCC)   1. Acute on chronic respiratory failure with hypoxia and hypercapnia patient right now is on full vent support his mechanics have been very poor he is failed spontaneous breathing trial attempts.  We will continue to assess and reassess until he is able to be weaned. 2. Acute kidney injury with acute tubular necrosis his creatinine seems to have leveled off about 4.5 range.  Nephrology consultation will be assessed 3. Acute on chronic congestive heart failure we will continue to monitor the fluid status closely due to underlying renal failure may not respond adequately to diuretics again nephrology evaluation 4. Status post cardiac arrest currently patient is in stable rhythm we will continue to monitor. 5. Sepsis with shock patient was treated with antibiotics he was also given antifungal therapy.  He is currently hemodynamically stable 6. Obstructive sleep apnea popping syndrome currently is not an issue  I have personally seen and evaluated the patient, evaluated laboratory and imaging results, formulated the assessment and plan and placed orders. The Patient requires high complexity decision making for assessment and support.  Case was discussed on Rounds with the Respiratory Therapy Staff Time Spent  Yevonne Pax, MD [facility] Pulmonary Critical Care Medicine Sleep Medicine

## 2017-06-01 ENCOUNTER — Other Ambulatory Visit (HOSPITAL_COMMUNITY): Payer: Self-pay

## 2017-06-01 LAB — COMPREHENSIVE METABOLIC PANEL
ALT: 18 U/L (ref 17–63)
AST: 49 U/L — AB (ref 15–41)
Albumin: 2.9 g/dL — ABNORMAL LOW (ref 3.5–5.0)
Alkaline Phosphatase: 229 U/L — ABNORMAL HIGH (ref 38–126)
Anion gap: 16 — ABNORMAL HIGH (ref 5–15)
BUN: 90 mg/dL — ABNORMAL HIGH (ref 6–20)
CHLORIDE: 86 mmol/L — AB (ref 101–111)
CO2: 25 mmol/L (ref 22–32)
CREATININE: 5.91 mg/dL — AB (ref 0.61–1.24)
Calcium: 8.8 mg/dL — ABNORMAL LOW (ref 8.9–10.3)
GFR calc non Af Amer: 9 mL/min — ABNORMAL LOW (ref >60)
GFR, EST AFRICAN AMERICAN: 11 mL/min — AB (ref >60)
Glucose, Bld: 97 mg/dL (ref 65–99)
POTASSIUM: 4.4 mmol/L (ref 3.5–5.1)
SODIUM: 127 mmol/L — AB (ref 135–145)
Total Bilirubin: 1.5 mg/dL — ABNORMAL HIGH (ref 0.3–1.2)
Total Protein: 8.3 g/dL — ABNORMAL HIGH (ref 6.5–8.1)

## 2017-06-01 LAB — MAGNESIUM: MAGNESIUM: 2.1 mg/dL (ref 1.7–2.4)

## 2017-06-01 LAB — PROTIME-INR
INR: 1.18
PROTHROMBIN TIME: 14.9 s (ref 11.4–15.2)

## 2017-06-01 LAB — CBC
HEMATOCRIT: 27.5 % — AB (ref 39.0–52.0)
HEMOGLOBIN: 9.3 g/dL — AB (ref 13.0–17.0)
MCH: 27.1 pg (ref 26.0–34.0)
MCHC: 33.8 g/dL (ref 30.0–36.0)
MCV: 80.2 fL (ref 78.0–100.0)
Platelets: 256 10*3/uL (ref 150–400)
RBC: 3.43 MIL/uL — AB (ref 4.22–5.81)
RDW: 17.7 % — ABNORMAL HIGH (ref 11.5–15.5)
WBC: 7 10*3/uL (ref 4.0–10.5)

## 2017-06-01 LAB — PHOSPHORUS: PHOSPHORUS: 6.7 mg/dL — AB (ref 2.5–4.6)

## 2017-06-01 NOTE — Progress Notes (Signed)
Pulmonary Critical Care Medicine [facility] [place]   PULMONARY SERVICE  PROGRESS NOTE  Date of Service: 06/01/2017  Sahand Gosch  YNW:[id number]  DOB: 1955-11-28   DOA: Jun 22, 2017  Referring Physician: Carron Curie, MD  HPI: Melvin Kelley is a 62 y.o. male seen for follow up of Acute on Chronic Respiratory Failure.  Patient is doing well with was attempted on spontaneous breathing trial but failed right now is on assist control mode has been on 30% oxygen.  Medications: Reviewed on Rounds  Physical Exam:  Vitals: Temperature 99.6 pulse 101 respiratory rate 13 blood pressure 122/75 saturations 98%  Ventilator Settings mode of ventilation assist control FiO2 30% tidal volume 470 PEEP 5  . General: Comfortable at this time . Eyes: Grossly normal lids, irises & conjunctiva . ENT: grossly tongue is normal . Neck: no obvious mass . Cardiovascular: S1-S2 normal no gallop . Respiratory: No rhonchi expansion equal . Abdomen: Soft nontender . Skin: no rash seen on limited exam . Musculoskeletal: not rigid . Psychiatric:unable to assess . Neurologic: no seizure no involuntary movements         Labs on Admission:  Basic Metabolic Panel: Recent Labs  Lab 05/27/17 0501 05/28/17 0408 06/22/2017 0341 Jun 22, 2017 1852 05/30/17 1104 06/01/17 0601  NA 134* 138 137 136 132* 127*  K 3.9 4.1 3.7 3.7 3.7 4.4  CL 91* 96* 96* 95* 92* 86*  CO2 28 30 30 28 25 25   GLUCOSE 71 74 101* 117* 115* 97  BUN 50* 50* 51* 56* 66* 90*  CREATININE 3.61* 4.42* 4.45* 4.48* 4.36* 5.91*  CALCIUM 8.7* 8.9 9.0 9.2 9.3 8.8*  MG 2.0 1.9 1.8 1.9  --  2.1  PHOS 3.4 4.1 4.9* 4.4  --  6.7*    Liver Function Tests: Recent Labs  Lab 05/27/17 0501 05/28/17 0408 2017-06-22 0341 Jun 22, 2017 1852 06/01/17 0601  AST  --  83*  --  56* 49*  ALT  --  45  --  28 18  ALKPHOS  --  223*  --  203* 229*  BILITOT  --  1.0  --  1.1 1.5*  PROT  --  6.9  --  7.7 8.3*  ALBUMIN 2.4* 2.6* 2.5* 2.7* 2.9*   No  results for input(s): LIPASE, AMYLASE in the last 168 hours. No results for input(s): AMMONIA in the last 168 hours.  CBC: Recent Labs  Lab 05/27/17 0501 05/28/17 0408 22-Jun-2017 0341 05/30/17 1104 06/01/17 0942  WBC 9.6 6.9 4.5 7.2 7.0  NEUTROABS  --   --   --  4.5  --   HGB 8.5* 8.4* 8.0* 9.2* 9.3*  HCT 25.5* 25.7* 25.0* 28.0* 27.5*  MCV 81.2 82.6 84.7 80.9 80.2  PLT 385 321 223 213 256    Cardiac Enzymes: No results for input(s): CKTOTAL, CKMB, CKMBINDEX, TROPONINI in the last 168 hours.  BNP (last 3 results) No results for input(s): BNP in the last 8760 hours.  ProBNP (last 3 results) No results for input(s): PROBNP in the last 8760 hours.  Radiological Exams on Admission: Dg Neck Soft Tissue  Result Date: 06/01/2017 CLINICAL DATA:  Subcutaneous emphysema. EXAM: NECK SOFT TISSUES - 1+ VIEW COMPARISON:  None. FINDINGS: Nasogastric tube is coiled in the hypopharynx. There is subcutaneous emphysema throughout the neck. Degenerative changes are seen in the spine. Tracheostomy is noted. IMPRESSION: 1. Nasogastric tube is coiled in the hypopharynx. 2. Extensive subcutaneous emphysema. Electronically Signed   By: Leanna Battles M.D.   On: 06/01/2017 07:56  Dg Chest Port 1 View  Result Date: 06/01/2017 CLINICAL DATA:  Subcutaneous emphysema. EXAM: PORTABLE CHEST 1 VIEW COMPARISON:  Two-view chest x-ray 05/31/2017 FINDINGS: Heart size is normal. Tracheostomy tube is in place. NG tube extends into the stomach. Pacing and defibrillator wires are noted. A right-sided PICC line is stable. Subcutaneous emphysema in the neck is improving. Lung volumes have improved. Right middle lobe or superior segment lower lobe airspace disease is again seen. There is diffuse gaseous distention of bowel. IMPRESSION: 1. Improving aeration of both lungs. 2. Airspace disease on the right representing atelectasis or infection. 3. Improving subcutaneous emphysema. 4. Diffuse gaseous distention of bowel.  Electronically Signed   By: Marin Roberts M.D.   On: 06/01/2017 07:57   Dg Chest Port 1 View  Result Date: 05/31/2017 CLINICAL DATA:  Subcutaneous emphysema EXAM: PORTABLE CHEST 1 VIEW COMPARISON:  06/06/17, 05/28/2017, 05/27/2017 FINDINGS: Tracheostomy tube is in place. Esophageal tube tip overlies the proximal stomach. Possible coiling of the proximal tube over the neck. Right upper extremity catheter tip overlies the distal SVC. Left-sided pacing device, similar appearance. Small amount of subcutaneous emphysema at the right supraclavicular fossa and right neck. Possible small amount of subcutaneous emphysema or pneumomediastinum base of left neck. Postsurgical changes at the left base. Low lung volumes. Elevated left diaphragm. Scarring in the right lower lung. Stable cardiomediastinal silhouette. Air distended bowel in the upper abdomen IMPRESSION: 1. Small amount of subcutaneous emphysema at the right neck and right supraclavicular fossa. Small focus of supraclavicular fossa emphysema versus small amount of pneumomediastinum at the base of the left neck. 2. Low lung volumes with elevated left diaphragm and scarring in the right lower lung. Air distended bowel as before. 3. Esophageal tube tip overlies the left upper quadrant. Possible coiling of proximal tube at the base of the neck. Electronically Signed   By: Jasmine Pang M.D.   On: 05/31/2017 19:56   Dg Chest Port 1 View  Result Date: 06-06-2017 CLINICAL DATA:  Tracheostomy present.  Cardiac arrhythmia EXAM: PORTABLE CHEST 1 VIEW COMPARISON:  May 28, 2017 FINDINGS: Tracheostomy catheter tip is 5.1 cm above the carina. Nasogastric tube tip and side port are in the stomach. Pacemaker leads are attached to the right atrium, stable. Central catheter tip is in the superior vena cava. No pneumothorax. There is atelectatic change in the right lower lobe. Lungs elsewhere are clear. Heart size and pulmonary vascularity are normal. No adenopathy.  No bone lesions. IMPRESSION: Tube and catheter positions as described without evident pneumothorax. Atelectasis right lower lobe. Lungs elsewhere are clear. Heart size within normal limits. Electronically Signed   By: Bretta Bang III M.D.   On: 2017-06-06 17:49   Dg Abd Portable 1v  Result Date: 06/01/2017 CLINICAL DATA:  Orogastric tube placement. EXAM: PORTABLE ABDOMEN - 1 VIEW COMPARISON:  Radiograph of May 31, 2017. FINDINGS: Distal tip of orogastric tube is seen in proximal stomach. Stable bowel dilatation is noted consistent with ileus. Surgical clips are seen in left upper quadrant. IMPRESSION: Distal tip of orogastric tube seen in proximal stomach. Stable bowel dilatation is noted consistent with ileus. Electronically Signed   By: Lupita Raider, M.D.   On: 06/01/2017 13:41   Dg Abd Portable 1v  Result Date: 05/31/2017 CLINICAL DATA:  Ileus EXAM: PORTABLE ABDOMEN - 1 VIEW COMPARISON:  Plain films of the abdomen dated Jun 06, 2017 and 05/22/2017. FINDINGS: Diffuse marked gaseous bowel distension persists, not appreciably changed compared to the previous exams. Enteric tube is stable  in position with tip directed towards the stomach fundus. Surgical changes again noted within the central abdomen and LEFT upper quadrant. IMPRESSION: Stable appearance of the bowel, with diffuse marked gaseous bowel distension, compatible with given history of ileus. Electronically Signed   By: Bary Richard M.D.   On: 05/31/2017 09:17   Dg Abd Portable 1v  Result Date: 05/19/2017 CLINICAL DATA:  Ileus EXAM: PORTABLE ABDOMEN - 1 VIEW COMPARISON:  None. FINDINGS: Nasogastric tube tip and side port are in the stomach. There is diffuse bowel dilatation without appreciable air-fluid level. No free air evident on this supine examination. Areas of postoperative change noted bilaterally. IMPRESSION: Nasogastric tube tip and side port in stomach. Diffuse bowel dilatation in a pattern suggestive of ileus. A degree of  distal bowel obstruction cannot be excluded radiographically. No free air is seen on this supine examination. Electronically Signed   By: Bretta Bang III M.D.   On: 06/02/2017 17:50    Assessment/Plan Active Problems:   Cardiac arrest (HCC)   Acute on chronic respiratory failure (HCC)   DNR (do not resuscitate) discussion   AKI (acute kidney injury) (HCC)   CHF (congestive heart failure) (HCC)   Obstructive sleep apnea   Acute kidney injury (HCC)   1. Acute on chronic respiratory failure with hypoxia so far has been failing attempts at weaning on pressure support 5/5.  I asked respiratory therapy to change that wean to a 12/5 weaning.  We will see how he is able to tolerate that. 2. Acute kidney injury nephrology consultation will continue with supportive care 3. Acute on chronic congestive heart failure we will continue with monitoring fluid status 4. Status post cardiac arrest no change noted 5. Sepsis with shock hemodynamically stable 6. Obstructive sleep apnea no issues at this time   I have personally seen and evaluated the patient, evaluated laboratory and imaging results, formulated the assessment and plan and placed orders. The Patient requires high complexity decision making for assessment and support.  Case was discussed on Rounds with the Respiratory Therapy Staff  Yevonne Pax, MD [facility] Pulmonary Critical Care Medicine Sleep Medicine

## 2017-06-02 ENCOUNTER — Encounter (HOSPITAL_COMMUNITY): Payer: Self-pay | Admitting: Interventional Radiology

## 2017-06-02 ENCOUNTER — Other Ambulatory Visit (HOSPITAL_COMMUNITY): Payer: Self-pay

## 2017-06-02 DIAGNOSIS — I5043 Acute on chronic combined systolic (congestive) and diastolic (congestive) heart failure: Secondary | ICD-10-CM

## 2017-06-02 DIAGNOSIS — I469 Cardiac arrest, cause unspecified: Secondary | ICD-10-CM

## 2017-06-02 DIAGNOSIS — J9621 Acute and chronic respiratory failure with hypoxia: Secondary | ICD-10-CM

## 2017-06-02 DIAGNOSIS — G4733 Obstructive sleep apnea (adult) (pediatric): Secondary | ICD-10-CM

## 2017-06-02 DIAGNOSIS — J9622 Acute and chronic respiratory failure with hypercapnia: Secondary | ICD-10-CM

## 2017-06-02 DIAGNOSIS — N179 Acute kidney failure, unspecified: Secondary | ICD-10-CM

## 2017-06-02 HISTORY — PX: IR FLUORO GUIDE CV LINE RIGHT: IMG2283

## 2017-06-02 HISTORY — PX: IR US GUIDE VASC ACCESS RIGHT: IMG2390

## 2017-06-02 MED ORDER — LIDOCAINE HCL (PF) 1 % IJ SOLN
INTRAMUSCULAR | Status: DC | PRN
  Administered 2017-06-02 – 2017-08-07 (×2): 5 mL

## 2017-06-02 MED ORDER — LIDOCAINE HCL 1 % IJ SOLN
INTRAMUSCULAR | Status: AC
  Filled 2017-06-02: qty 20

## 2017-06-02 MED ORDER — HEPARIN SODIUM (PORCINE) 1000 UNIT/ML IJ SOLN
INTRAMUSCULAR | Status: AC
  Filled 2017-06-02: qty 1

## 2017-06-02 NOTE — Progress Notes (Signed)
Pulmonary Critical Care Medicine [facility] [place]   PULMONARY SERVICE  PROGRESS NOTE  Date of Service: 06/02/2017  Melvin Kelley  ZOX:[id number]  DOB: Jun 05, 1955   DOA: 05/31/2017  Referring Physician: Carron Curie, MD  HPI: Melvin Kelley is a 62 y.o. male seen for follow up of Acute on Chronic Respiratory Failure.  Patient is able to do 6 hours of pressure support right now is back on the ventilator resting comfortably  Medications: Reviewed on Rounds  Physical Exam:  Vitals: Temperature 96.3 pulse 97 rest rate 11 blood pressure 105/64 saturations 99%  Ventilator Settings mode of ventilation assist control 20% FiO2 tidal volume 513 PEEP 5  . General: Comfortable at this time . Eyes: Grossly normal lids, irises & conjunctiva . ENT: grossly tongue is normal . Neck: no obvious mass . Cardiovascular: S1-S2 normal no gallop . Respiratory: No rhonchi . Abdomen: Soft nontender . Skin: no rash seen on limited exam . Musculoskeletal: not rigid . Psychiatric:unable to assess . Neurologic: no seizure no involuntary movements         Labs on Admission:  Basic Metabolic Panel: Recent Labs  Lab 05/27/17 0501 05/28/17 0408 05/19/2017 0341 05/27/2017 1852 05/30/17 1104 06/01/17 0601  NA 134* 138 137 136 132* 127*  K 3.9 4.1 3.7 3.7 3.7 4.4  CL 91* 96* 96* 95* 92* 86*  CO2 28 30 30 28 25 25   GLUCOSE 71 74 101* 117* 115* 97  BUN 50* 50* 51* 56* 66* 90*  CREATININE 3.61* 4.42* 4.45* 4.48* 4.36* 5.91*  CALCIUM 8.7* 8.9 9.0 9.2 9.3 8.8*  MG 2.0 1.9 1.8 1.9  --  2.1  PHOS 3.4 4.1 4.9* 4.4  --  6.7*    Liver Function Tests: Recent Labs  Lab 05/27/17 0501 05/28/17 0408 06/03/2017 0341 05/27/2017 1852 06/01/17 0601  AST  --  83*  --  56* 49*  ALT  --  45  --  28 18  ALKPHOS  --  223*  --  203* 229*  BILITOT  --  1.0  --  1.1 1.5*  PROT  --  6.9  --  7.7 8.3*  ALBUMIN 2.4* 2.6* 2.5* 2.7* 2.9*   No results for input(s): LIPASE, AMYLASE in the last 168  hours. No results for input(s): AMMONIA in the last 168 hours.  CBC: Recent Labs  Lab 05/27/17 0501 05/28/17 0408 06/12/2017 0341 05/30/17 1104 06/01/17 0942  WBC 9.6 6.9 4.5 7.2 7.0  NEUTROABS  --   --   --  4.5  --   HGB 8.5* 8.4* 8.0* 9.2* 9.3*  HCT 25.5* 25.7* 25.0* 28.0* 27.5*  MCV 81.2 82.6 84.7 80.9 80.2  PLT 385 321 223 213 256    Cardiac Enzymes: No results for input(s): CKTOTAL, CKMB, CKMBINDEX, TROPONINI in the last 168 hours.  BNP (last 3 results) No results for input(s): BNP in the last 8760 hours.  ProBNP (last 3 results) No results for input(s): PROBNP in the last 8760 hours.  Radiological Exams on Admission: Dg Neck Soft Tissue  Result Date: 06/01/2017 CLINICAL DATA:  Subcutaneous emphysema. EXAM: NECK SOFT TISSUES - 1+ VIEW COMPARISON:  None. FINDINGS: Nasogastric tube is coiled in the hypopharynx. There is subcutaneous emphysema throughout the neck. Degenerative changes are seen in the spine. Tracheostomy is noted. IMPRESSION: 1. Nasogastric tube is coiled in the hypopharynx. 2. Extensive subcutaneous emphysema. Electronically Signed   By: Leanna Battles M.D.   On: 06/01/2017 07:56   Dg Chest [facility]  Result Date: 06/01/2017 CLINICAL DATA:  Subcutaneous emphysema. EXAM: PORTABLE CHEST 1 VIEW COMPARISON:  Two-view chest x-ray 05/31/2017 FINDINGS: Heart size is normal. Tracheostomy tube is in place. NG tube extends into the stomach. Pacing and defibrillator wires are noted. A right-sided PICC line is stable. Subcutaneous emphysema in the neck is improving. Lung volumes have improved. Right middle lobe or superior segment lower lobe airspace disease is again seen. There is diffuse gaseous distention of bowel. IMPRESSION: 1. Improving aeration of both lungs. 2. Airspace disease on the right representing atelectasis or infection. 3. Improving subcutaneous emphysema. 4. Diffuse gaseous distention of bowel. Electronically Signed   By: Marin Roberts M.D.   On:  06/01/2017 07:57   Dg Chest Port 1 View  Result Date: 05/31/2017 CLINICAL DATA:  Subcutaneous emphysema EXAM: PORTABLE CHEST 1 VIEW COMPARISON:  06/01/2017, 05/28/2017, 05/27/2017 FINDINGS: Tracheostomy tube is in place. Esophageal tube tip overlies the proximal stomach. Possible coiling of the proximal tube over the neck. Right upper extremity catheter tip overlies the distal SVC. Left-sided pacing device, similar appearance. Small amount of subcutaneous emphysema at the right supraclavicular fossa and right neck. Possible small amount of subcutaneous emphysema or pneumomediastinum base of left neck. Postsurgical changes at the left base. Low lung volumes. Elevated left diaphragm. Scarring in the right lower lung. Stable cardiomediastinal silhouette. Air distended bowel in the upper abdomen IMPRESSION: 1. Small amount of subcutaneous emphysema at the right neck and right supraclavicular fossa. Small focus of supraclavicular fossa emphysema versus small amount of pneumomediastinum at the base of the left neck. 2. Low lung volumes with elevated left diaphragm and scarring in the right lower lung. Air distended bowel as before. 3. Esophageal tube tip overlies the left upper quadrant. Possible coiling of proximal tube at the base of the neck. Electronically Signed   By: Jasmine Pang M.D.   On: 05/31/2017 19:56   Dg Chest Port 1 View  Result Date: 06/01/2017 CLINICAL DATA:  Tracheostomy present.  Cardiac arrhythmia EXAM: PORTABLE CHEST 1 VIEW COMPARISON:  May 28, 2017 FINDINGS: Tracheostomy catheter tip is 5.1 cm above the carina. Nasogastric tube tip and side port are in the stomach. Pacemaker leads are attached to the right atrium, stable. Central catheter tip is in the superior vena cava. No pneumothorax. There is atelectatic change in the right lower lobe. Lungs elsewhere are clear. Heart size and pulmonary vascularity are normal. No adenopathy. No bone lesions. IMPRESSION: Tube and catheter positions as  described without evident pneumothorax. Atelectasis right lower lobe. Lungs elsewhere are clear. Heart size within normal limits. Electronically Signed   By: Bretta Bang III M.D.   On: 06/09/2017 17:49   Dg Abd Portable 1v  Result Date: 06/01/2017 CLINICAL DATA:  Orogastric tube placement. EXAM: PORTABLE ABDOMEN - 1 VIEW COMPARISON:  Radiograph of May 31, 2017. FINDINGS: Distal tip of orogastric tube is seen in proximal stomach. Stable bowel dilatation is noted consistent with ileus. Surgical clips are seen in left upper quadrant. IMPRESSION: Distal tip of orogastric tube seen in proximal stomach. Stable bowel dilatation is noted consistent with ileus. Electronically Signed   By: Lupita Raider, M.D.   On: 06/01/2017 13:41   Dg Abd Portable 1v  Result Date: 05/31/2017 CLINICAL DATA:  Ileus EXAM: PORTABLE ABDOMEN - 1 VIEW COMPARISON:  Plain films of the abdomen dated 06/02/2017 and 05/22/2017. FINDINGS: Diffuse marked gaseous bowel distension persists, not appreciably changed compared to the previous exams. Enteric tube is stable in position with tip directed towards  the stomach fundus. Surgical changes again noted within the central abdomen and LEFT upper quadrant. IMPRESSION: Stable appearance of the bowel, with diffuse marked gaseous bowel distension, compatible with given history of ileus. Electronically Signed   By: Bary Richard M.D.   On: 05/31/2017 09:17   Dg Abd Portable 1v  Result Date: 05/30/17 CLINICAL DATA:  Ileus EXAM: PORTABLE ABDOMEN - 1 VIEW COMPARISON:  None. FINDINGS: Nasogastric tube tip and side port are in the stomach. There is diffuse bowel dilatation without appreciable air-fluid level. No free air evident on this supine examination. Areas of postoperative change noted bilaterally. IMPRESSION: Nasogastric tube tip and side port in stomach. Diffuse bowel dilatation in a pattern suggestive of ileus. A degree of distal bowel obstruction cannot be excluded radiographically.  No free air is seen on this supine examination. Electronically Signed   By: Bretta Bang III M.D.   On: 30-May-2017 17:50    Assessment/Plan Active Problems:   Cardiac arrest (HCC)   Acute on chronic respiratory failure (HCC)   DNR (do not resuscitate) discussion   AKI (acute kidney injury) (HCC)   CHF (congestive heart failure) (HCC)   Obstructive sleep apnea   Acute kidney injury (HCC)   1. Acute on chronic respiratory failure with hypoxia patient right now is weaning on pressure support mode we will continue to advance as tolerated 2. Acute kidney failure patient to have hemodialysis 3. Congestive heart failure we will continue with supportive care 4. Cardiac arrest status post encephalopathy we will continue with supportive care   I have personally seen and evaluated the patient, evaluated laboratory and imaging results, formulated the assessment and plan and placed orders. The Patient requires high complexity decision making for assessment and support.  Case was discussed on Rounds with the Respiratory Therapy Staff  Yevonne Pax, MD [facility] Pulmonary Critical Care Medicine Sleep Medicine

## 2017-06-02 NOTE — Procedures (Signed)
Interventional Radiology Procedure Note  Procedure: Placement right subclavian Trialysis non-tunneled HD catheter.  Tip in the upper RA and ready for use.   Complications: None  Estimated Blood Loss: None  Recommendations: - Line ready for use.   Signed,  Sterling Big, MD

## 2017-06-02 NOTE — Progress Notes (Addendum)
Patient ID: Melvin Kelley, male   DOB: [birth year], 62 y.o.   MRN: [id number]      Patient Status: [facility] IP  Assessment and Plan:  Patient in need of venous access.  Acute on chronic renal injury Worsening creatinine Need to start dialysis Scheduled for temporary dialysis catheter placement  ______________________________________________________________________   History of Present Illness: Melvin Kelley is a 62 y.o. male   Respiratory failure CHF Admitted to [facility] from outside facility after Cardiac Arrest Developed anoxic encephalopathy Ventilator; acute kidney injury Now need for dialysis  Allergies and medications reviewed.   Review of Systems: A 12 point ROS discussed and pertinent positives are indicated in the HPI above.  All other systems are negative.   Vital Signs: There were no vitals taken for this visit.  Physical Exam  Cardiovascular: Normal rate.  Pulmonary/Chest:  Trach/vent  Skin: Skin is warm and dry.  Psychiatric:  Consented with son Melvin Kelley via phone  Nursing note and vitals reviewed.    Imaging reviewed.   Labs:  COAGS: Recent Labs    05/24/17 0642 06/01/17 0942  INR 1.98 1.18  APTT 72*  --     BMP: Recent Labs    05/19/2017 0341 05/26/2017 1852 05/30/17 1104 06/01/17 0601  NA 137 136 132* 127*  K 3.7 3.7 3.7 4.4  CL 96* 95* 92* 86*  CO2 30 28 25 25   GLUCOSE 101* 117* 115* 97  BUN 51* 56* 66* 90*  CALCIUM 9.0 9.2 9.3 8.8*  CREATININE 4.45* 4.48* 4.36* 5.91*  GFRNONAA 13* 13* 13* 9*  GFRAA 15* 15* 16* 11*       Electronically Signed: Robbie Rideaux A, PA-C 06/02/2017, 7:19 AM   I spent a total of 15 minutes in face to face in clinical consultation, greater than 50% of which was counseling/coordinating care for venous access.

## 2017-06-03 LAB — BASIC METABOLIC PANEL
Anion gap: 20 — ABNORMAL HIGH (ref 5–15)
BUN: 146 mg/dL — AB (ref 6–20)
CO2: 22 mmol/L (ref 22–32)
Calcium: 8.7 mg/dL — ABNORMAL LOW (ref 8.9–10.3)
Chloride: 75 mmol/L — ABNORMAL LOW (ref 101–111)
Creatinine, Ser: 6.41 mg/dL — ABNORMAL HIGH (ref 0.61–1.24)
GFR, EST AFRICAN AMERICAN: 10 mL/min — AB (ref >60)
GFR, EST NON AFRICAN AMERICAN: 8 mL/min — AB (ref >60)
Glucose, Bld: 135 mg/dL — ABNORMAL HIGH (ref 65–99)
Potassium: 4.5 mmol/L (ref 3.5–5.1)
SODIUM: 117 mmol/L — AB (ref 135–145)

## 2017-06-03 LAB — RENAL FUNCTION PANEL
Albumin: 2.7 g/dL — ABNORMAL LOW (ref 3.5–5.0)
Anion gap: 20 — ABNORMAL HIGH (ref 5–15)
BUN: 146 mg/dL — AB (ref 6–20)
CALCIUM: 8.7 mg/dL — AB (ref 8.9–10.3)
CO2: 22 mmol/L (ref 22–32)
CREATININE: 6.28 mg/dL — AB (ref 0.61–1.24)
Chloride: 74 mmol/L — ABNORMAL LOW (ref 101–111)
GFR calc Af Amer: 10 mL/min — ABNORMAL LOW (ref >60)
GFR calc non Af Amer: 9 mL/min — ABNORMAL LOW (ref >60)
GLUCOSE: 125 mg/dL — AB (ref 65–99)
Phosphorus: 10.2 mg/dL — ABNORMAL HIGH (ref 2.5–4.6)
Potassium: 5 mmol/L (ref 3.5–5.1)
Sodium: 116 mmol/L — CL (ref 135–145)

## 2017-06-03 LAB — CBC
HCT: 27.6 % — ABNORMAL LOW (ref 39.0–52.0)
HCT: 27.1 % — ABNORMAL LOW (ref 39.0–52.0)
Hemoglobin: 9.4 g/dL — ABNORMAL LOW (ref 13.0–17.0)
Hemoglobin: 9.2 g/dL — ABNORMAL LOW (ref 13.0–17.0)
MCH: 27 pg (ref 26.0–34.0)
MCH: 26.6 pg (ref 26.0–34.0)
MCHC: 34.1 g/dL (ref 30.0–36.0)
MCHC: 33.9 g/dL (ref 30.0–36.0)
MCV: 79.3 fL (ref 78.0–100.0)
MCV: 78.3 fL (ref 78.0–100.0)
PLATELETS: 299 10*3/uL (ref 150–400)
PLATELETS: 262 10*3/uL (ref 150–400)
RBC: 3.48 MIL/uL — ABNORMAL LOW (ref 4.22–5.81)
RBC: 3.46 MIL/uL — ABNORMAL LOW (ref 4.22–5.81)
RDW: 17.3 % — ABNORMAL HIGH (ref 11.5–15.5)
RDW: 16.9 % — AB (ref 11.5–15.5)
WBC: 7.9 10*3/uL (ref 4.0–10.5)
WBC: 6.9 10*3/uL (ref 4.0–10.5)

## 2017-06-03 NOTE — Consult Note (Signed)
[facility] CONSULT NOTE    Date: 06/03/2017                  Patient Name:  Melvin Kelley  MRN: [id number]  DOB: Oct 10, 1955  Age / Sex: 62 y.o., male         PCP: Gerline Legacy, MD                 Service Requesting Consult: Hospitalist                 Reason for Consult: Acute renal failure            History of Present Illness: Patient is a 62 y.o. 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  The patient has had rather prolonged hospitalization and is at [facility] as well as [facility] and most  recently [facility].  On May 24, 2016 patient was found to be pulseless here at our facility and was transferred down to [facility].  There he had a palliative care consultation where there was family discordance.  The patient's mother wished for ongoing aggressive care however the patient's children felt that the patient would not want the current therapy he is undergoing.  The patient has developed severe acute renal failure.  BUN is currently 146 with a creatinine of 6.4.  His serum sodium was also quite low at 117.  Hospitalist discussed the potential for renal placement therapy with the family and they have opted for this.  Temporary dialysis catheter has been placed for the same purpose.   Medications: Outpatie,t medications: Medications Prior to Admission  Medication Sig Dispense Refill Last Dose  . acetaminophen (TYLENOL) 325 MG tablet Place 650 mg into feeding tube  every 6 (six) hours as needed for mild pain or fever.   unknown  . albuterol (PROVENTIL) (2.5 MG/3ML) 0.083% nebulizer solution Take 2.5 mg by nebulization every 6 (six) hours as needed for wheezing or shortness of breath.   unknown  . bisacodyl (DULCOLAX) 10 MG suppository Place 10 mg rectally as needed for moderate constipation.   unknown  . Cholecalciferol 1000 units TBDP Place 5,000 Units into feeding tube daily.   unknown  . famotidine (PEPCID) 40 MG/5ML suspension Place 2.5 mLs (20 mg total) into feeding tube at bedtime. 50 mL 0   . gabapentin (NEURONTIN) 250 MG/5ML solution Place 300 mg into feeding tube 3 (three) times daily.   unknown  . insulin lispro (HUMALOG) 100 UNIT/ML injection Inject 0-10 Units into the skin 3 (three) times daily before meals.   unknown  . levothyroxine (SYNTHROID, LEVOTHROID) 88 MCG tablet Place 44 mcg into feeding tube daily before breakfast.   unknown  . lidocaine (LIDODERM) 5 % Place 1 patch onto the skin daily. Remove & Discard patch within 12 hours or as directed by MD   unknown  . Melatonin 3 MG TABS Place 4.5 mg into feeding tube at bedtime.   unknown  . [EXPIRED] meropenem 500 mg in sodium chloride 0.9 % 100 mL Inject 500 mg into the vein daily for 1 day.     . montelukast (  SINGULAIR) 10 MG tablet Place 10 mg into feeding tube at bedtime.   unknown  . Multiple Vitamins-Minerals (MULTIVITAMIN WITH MINERALS) tablet Place 1 tablet into feeding tube daily.   unknown  . sennosides-docusate sodium (SENOKOT-S) 8.6-50 MG tablet Take 1 tablet by mouth 2 (two) times daily as needed for constipation.   unknown  . tamsulosin (FLOMAX) 0.4 MG CAPS capsule Take 0.4 mg by mouth daily.   unknown  . thiamine 100 MG tablet Place 100 mg into feeding tube daily.   unknown  . TPN ADULT Inject into the vein continuous.   unknown      Allergies: Allergies  Allergen Reactions  . Levofloxacin Hives  . Morphine Shortness Of Breath  . Morphine And Related Shortness Of Breath   . Penicillins Hives and Rash    Has tolerated ceftriaxone and cefepime in previous admissions Has tolerated ceftriaxone and cefepime in previous admissions   . Sulfa Antibiotics Hives and Rash  . Oxycodone-Acetaminophen     Other reaction(s): Confusion  . Tramadol Diarrhea  . Trazodone And Nefazodone Itching  . Codeine Rash  . Glipizide Itching and Nausea Only  . Midazolam     confusion  . Trazodone Itching      Past Medical History: Past Medical History:  Diagnosis Date  . AICD (automatic cardioverter/defibrillator) present   . Anemia   . Anoxic encephalopathy (HCC)   . Asthma   . Candidemia (HCC)   . Cardiac arrest [facility])    Multiple cardiac arrests at [facility] 05/11/17, 03/2017  . CHF (congestive heart failure) (HCC)    EF 30%  . Crohn disease (HCC)   . Delirium   . DVT of upper extremity (deep vein thrombosis) (HCC)   . Hypoglycemia   . Malnutrition (HCC)    on long term TPN  . MDRO (multiple drug resistant organisms) resistance    Klebsiella and Citrobacter in Urine  . OSA (obstructive sleep apnea)   . Restrictive lung disease   . Sacral decubitus ulcer   . Short gut syndrome      Past Surgical History: Past Surgical History:  Procedure Laterality Date  . IR FLUORO GUIDE CV LINE RIGHT  06/02/2017  . IR US GUIDE VASC ACCESS RIGHT  06/02/2017     Family History: Family History  Family history unknown: Yes     Social History: Social History   Socioeconomic History  . Marital status: Single    Spouse name: Not on file  . Number of children: Not on file  . Years of education: Not on file  . Highest education level: Not on file  Occupational History  . Not on file  Social Needs  . Financial resource strain: Not on file  . Food insecurity:    Worry: Not on file    Inability: Not on file  . Transportation needs:    Medical: Not on file    Non-medical: Not on file  Tobacco Use  . Smoking status: Former Games developer  . Smokeless tobacco: Never Used   Substance and Sexual Activity  . Alcohol use: Not Currently  . Drug use: Not Currently  . Sexual activity: Not Currently  Lifestyle  . Physical activity:    Days per week: Not on file    Minutes per session: Not on file  . Stress: Not on file  Relationships  . Social connections:    Talks on phone: Not on file    Gets together: Not on file    Attends religious service:  Not on file    Active member of club or organization: Not on file    Attends meetings of clubs or organizations: Not on file    Relationship status: Not on file  . Intimate partner violence:    Fear of current or ex partner: Not on file    Emotionally abused: Not on file    Physically abused: Not on file    Forced sexual activity: Not on file  Other Topics Concern  . Not on file  Social History Narrative  . Not on file     Review of Systems: Patient unable to provide as he's on the ventilator  Vital Signs: Temperature 96.8 pulse 92 respirations 24 blood pressure 101/55  Weight trends: There were no vitals filed for this visit.  Physical Exam: General: Critically ill appearing, severely emaciated  Head: Normocephalic, atraumatic.  Eyes: Anicteric  Nose: Mucous membranes moist, not inflammed, nonerythematous.  Throat: Oropharynx nonerythematous, no exudate appreciated.   Neck: Tracheostomy in place  Lungs:  Scattered rhonchi and rales  Heart: S1S2 no rubs  Abdomen:  Soft NTND, BS present  Extremities: No pretibial edema. Very little muscle mass on extremeties  Neurologic: Not responding to commands  Skin: No visible rashes, scars.    Lab results: Basic Metabolic Panel: Recent Labs  Lab 05/28/17 0408 06-28-2017 0341 2017-06-28 1852 05/30/17 1104 06/01/17 0601 06/03/17 0700  NA 138 137 136 132* 127* 117*  K 4.1 3.7 3.7 3.7 4.4 4.5  CL 96* 96* 95* 92* 86* 75*  CO2 30 30 28 25 25 22   GLUCOSE 74 101* 117* 115* 97 135*  BUN 50* 51* 56* 66* 90* 146*  CREATININE 4.42* 4.45* 4.48* 4.36* 5.91*  6.41*  CALCIUM 8.9 9.0 9.2 9.3 8.8* 8.7*  MG 1.9 1.8 1.9  --  2.1  --   PHOS 4.1 4.9* 4.4  --  6.7*  --     Liver Function Tests: Recent Labs  Lab 05/28/17 0408 28-Jun-2017 0341 06-28-2017 1852 06/01/17 0601  AST 83*  --  56* 49*  ALT 45  --  28 18  ALKPHOS 223*  --  203* 229*  BILITOT 1.0  --  1.1 1.5*  PROT 6.9  --  7.7 8.3*  ALBUMIN 2.6* 2.5* 2.7* 2.9*   No results for input(s): LIPASE, AMYLASE in the last 168 hours. No results for input(s): AMMONIA in the last 168 hours.  CBC: Recent Labs  Lab 05/28/17 0408 28-Jun-2017 0341 05/30/17 1104 06/01/17 0942 06/03/17 0700  WBC 6.9 4.5 7.2 7.0 7.9  NEUTROABS  --   --  4.5  --   --   HGB 8.4* 8.0* 9.2* 9.3* 9.4*  HCT 25.7* 25.0* 28.0* 27.5* 27.6*  MCV 82.6 84.7 80.9 80.2 79.3  PLT 321 223 213 256 299    Cardiac Enzymes: No results for input(s): CKTOTAL, CKMB, CKMBINDEX, TROPONINI in the last 168 hours.  BNP: Invalid input(s): POCBNP  CBG: Recent Labs  Lab 05/28/17 2334 05/28/17 2336 2017-06-28 0416 Jun 28, 2017 0724 06-28-17 1134  GLUCAP 48* 73 118* 107* 104*    Microbiology: Results for orders placed or performed during the hospital encounter of 05/24/17  MRSA PCR Screening     Status: Abnormal   Collection Time: 05/24/17  6:01 AM  Result Value Ref Range Status   MRSA by PCR POSITIVE (A) NEGATIVE Final    Comment:        The GeneXpert MRSA Assay (FDA approved for NASAL specimens only), is one component of a  comprehensive MRSA colonization surveillance program. It is not intended to diagnose MRSA infection nor to guide or monitor treatment for MRSA infections. RESULT CALLED TO, READ BACK BY AND VERIFIED WITHAlgis Downs [facility] RN AT [phone] 05/24/17 BY A.DAVIS Performed at [facility], [address], [place], [place] [address]   Fungus culture, blood     Status: None   Collection Time: 05/24/17  6:15 AM  Result Value Ref Range Status   Specimen Description BLOOD RIGHT HAND  Final   Special Requests   Final     BOTTLES DRAWN AEROBIC ONLY Blood Culture results may not be optimal due to an inadequate volume of blood received in culture bottles   Culture   Final    NO GROWTH 7 DAYS NO FUNGUS ISOLATED Performed at [facility], [address] [place], [place] [address]    Report Status 05/31/2017 FINAL  Final  Culture, blood (routine x 2)     Status: None   Collection Time: 05/24/17  6:20 AM  Result Value Ref Range Status   Specimen Description BLOOD RIGHT HAND  Final   Special Requests   Final    BOTTLES DRAWN AEROBIC ONLY Blood Culture results may not be optimal due to an inadequate volume of blood received in culture bottles   Culture   Final    NO GROWTH 5 DAYS Performed at [facility], [address] [place], [place] [address]    Report Status 06/16/2017 FINAL  Final  Culture, blood (routine x 2)     Status: None   Collection Time: 05/24/17  6:41 AM  Result Value Ref Range Status   Specimen Description BLOOD LEFT ANTECUBITAL  Final   Special Requests   Final    BOTTLES DRAWN AEROBIC ONLY Blood Culture results may not be optimal due to an inadequate volume of blood received in culture bottles   Culture   Final    NO GROWTH 5 DAYS Performed at [facility], [address], [place], [place] [address]    Report Status 06/05/2017 FINAL  Final  Culture, respiratory (NON-Expectorated)     Status: None   Collection Time: 05/24/17  3:25 PM  Result Value Ref Range Status   Specimen Description TRACHEAL ASPIRATE  Final   Special Requests NONE  Final   Gram Stain   Final    ABUNDANT WBC PRESENT,BOTH PMN AND MONONUCLEAR RARE SQUAMOUS EPITHELIAL CELLS PRESENT RARE GRAM POSITIVE COCCI IN PAIRS IN CHAINS RARE GRAM NEGATIVE COCCOBACILLI    Culture   Final    FEW Consistent with normal respiratory flora. Performed at [facility], [address], [place], [place] [address]    Report Status 05/27/2017 FINAL  Final    Coagulation Studies: Recent Labs    06/01/17 0942   LABPROT 14.9  INR 1.18    Urinalysis: No results for input(s): COLORURINE, LABSPEC, PHURINE, GLUCOSEU, HGBUR, BILIRUBINUR, KETONESUR, PROTEINUR, UROBILINOGEN, NITRITE, LEUKOCYTESUR in the last 72 hours.  Invalid input(s): APPERANCEUR    Imaging: Ct Soft Tissue Neck Wo Contrast  Result Date: 06/02/2017 CLINICAL DATA:  Pneumothorax and pneumomediastinum EXAM: CT NECK WITHOUT CONTRAST TECHNIQUE: Multidetector CT imaging of the neck was performed following the standard protocol without intravenous contrast. COMPARISON:  None. FINDINGS: PHARYNX AND LARYNX: --Nasopharynx: Fossae of Rosenmuller are clear. Normal adenoid tonsils for age. --Oral cavity and oropharynx: The palatine and lingual tonsils are normal. The visible oral cavity and floor of mouth are normal. --Hypopharynx: Normal  vallecula and pyriform sinuses. --Larynx: Normal epiglottis and pre-epiglottic space. Normal aryepiglottic and vocal folds. --Retropharyngeal space: No abscess, effusion or lymphadenopathy. SALIVARY GLANDS: --Parotid: No mass lesion or inflammation. No sialolithiasis or ductal dilatation. --Submandibular: Symmetric without inflammation. No sialolithiasis or ductal dilatation. --Sublingual: Normal. No ranula or other visible lesion of the base of tongue and floor of mouth. THYROID: Normal. LYMPH NODES: No enlarged or abnormal density lymph nodes. VASCULAR: Limited assessment without IV contrast. LIMITED INTRACRANIAL: Normal. VISUALIZED ORBITS: Normal. MASTOIDS AND VISUALIZED PARANASAL SINUSES: No fluid levels or advanced mucosal thickening. No mastoid effusion. SKELETON: No bony spinal canal stenosis. No lytic or blastic lesions. UPPER CHEST: Biapical emphysema. Extensive subcutaneous emphysema and dissecting emphysema within the deep spaces of the neck. Tracheostomy tube tip is at the level of the clavicular heads. Incompletely visualized pneumomediastinum. Assessment of the trachea is degraded by respiratory motion, but there  is asymmetric lucency along its posterior aspect within the upper thorax. The area appears normal on corresponding chest CT. Please see dedicated report. OTHER: None. IMPRESSION: Extensive emphysema of the subcutaneous and deep spaces of the neck with incompletely visualized pneumomediastinum. No causative lesion is identified. Electronically Signed   By: Deatra Robinson M.D.   On: 06/02/2017 18:18   Ct Chest Wo Contrast  Result Date: 06/02/2017 CLINICAL DATA:  62 y/o  M; follow-up of no pneumothorax. EXAM: CT CHEST WITHOUT CONTRAST TECHNIQUE: Multidetector CT imaging of the chest was performed following the standard protocol without IV contrast. COMPARISON:  06/01/2017 chest radiograph FINDINGS: Cardiovascular: Normal heart size. No pericardial effusion. Right central venous catheter tip extends to the right cavoatrial junction. There pacemaking leads within the right atrium and ventricle. Normal caliber thoracic aorta and main pulmonary artery. Mediastinum/Nodes: Subcutaneous emphysema of the superficial and deep compartments of the lower neck extends into the mediastinum and there is pneumomediastinum. Tracheostomy tube tip terminates in the midtrachea below the clavicles. Enteric tube tip coils in the proximal stomach. No mediastinal adenopathy or fluid collection. Normal thyroid gland. Lungs/Pleura: There is peripheral fibrosis and mild centrilobular emphysema of the lungs. No pneumoperitoneum. Ill-defined ground-glass opacities within the lungs and patchy consolidation in the right lung base may represent areas of atelectasis or pneumonitis. Trace right pleural effusion. Upper Abdomen: Air-filled distention of bowel in the upper abdomen. Musculoskeletal: Chronic sternal and rib fractures. No acute fracture identified. IMPRESSION: 1. Subcutaneous emphysema within the lower neck and small volume pneumomediastinum. No appreciable pneumothorax. 2. Patchy ground-glass opacities as well as small right lower lobe  consolidation may represent atelectasis or areas of pneumonitis. Small right effusion. 3. Mild emphysema and peripheral fibrosis of the lungs. 4. Air-filled distention of bowel in the upper abdomen partially visualized. Electronically Signed   By: Mitzi Hansen M.D.   On: 06/02/2017 19:59   Ir Fluoro Guide Cv Line Right  Result Date: 06/02/2017 INDICATION: 62 year old male with acute kidney injury in need of venous access for hemodialysis. EXAM: IR RIGHT FLOURO GUIDE CV LINE; IR ULTRASOUND GUIDANCE VASC ACCESS RIGHT MEDICATIONS: None ANESTHESIA/SEDATION: None FLUOROSCOPY TIME:  Fluoroscopy Time: 0 minutes 30 seconds (1 mGy). COMPLICATIONS: None immediate. PROCEDURE: Informed written consent was obtained from the patient after a thorough discussion of the procedural risks, benefits and alternatives. All questions were addressed. Maximal Sterile Barrier Technique was utilized including caps, mask, sterile gowns, sterile gloves, sterile drape, hand hygiene and skin antiseptic. A timeout was performed prior to the initiation of the procedure. The right subclavian vein was interrogated with ultrasound and found to be widely  patent. An image was obtained and stored for the medical record. Local anesthesia was attained by infiltration with 1% lidocaine. A small dermatotomy was made. Under real-time sonographic guidance, the vessel was punctured with a 21 gauge micropuncture needle. Using standard technique, the initial micro needle was exchanged over a 0.018 micro wire for a transitional 4 [place] micro sheath. The micro sheath was then exchanged over a 0.035 wire in the skin tract was dilated to 13 [place]. A [place] non tunneled hemodialysis catheter was then advanced over the wire and positioned with the tip in the right atrium. The catheter flushes and aspirates with ease. The catheter was secured to the skin with 0 Prolene suture. Sterile bandages were applied. IMPRESSION: Placement of a right subclavian  approach 20 cm Trialysis catheter. Electronically Signed   By: Malachy Moan M.D.   On: 06/02/2017 17:31   Ir US Guide Vasc Access Right  Result Date: 06/02/2017 INDICATION: 62 year old male with acute kidney injury in need of venous access for hemodialysis. EXAM: IR RIGHT FLOURO GUIDE CV LINE; IR ULTRASOUND GUIDANCE VASC ACCESS RIGHT MEDICATIONS: None ANESTHESIA/SEDATION: None FLUOROSCOPY TIME:  Fluoroscopy Time: 0 minutes 30 seconds (1 mGy). COMPLICATIONS: None immediate. PROCEDURE: Informed written consent was obtained from the patient after a thorough discussion of the procedural risks, benefits and alternatives. All questions were addressed. Maximal Sterile Barrier Technique was utilized including caps, mask, sterile gowns, sterile gloves, sterile drape, hand hygiene and skin antiseptic. A timeout was performed prior to the initiation of the procedure. The right subclavian vein was interrogated with ultrasound and found to be widely patent. An image was obtained and stored for the medical record. Local anesthesia was attained by infiltration with 1% lidocaine. A small dermatotomy was made. Under real-time sonographic guidance, the vessel was punctured with a 21 gauge micropuncture needle. Using standard technique, the initial micro needle was exchanged over a 0.018 micro wire for a transitional 4 [place] micro sheath. The micro sheath was then exchanged over a 0.035 wire in the skin tract was dilated to 13 [place]. A [place] non tunneled hemodialysis catheter was then advanced over the wire and positioned with the tip in the right atrium. The catheter flushes and aspirates with ease. The catheter was secured to the skin with 0 Prolene suture. Sterile bandages were applied. IMPRESSION: Placement of a right subclavian approach 20 cm Trialysis catheter. Electronically Signed   By: Malachy Moan M.D.   On: 06/02/2017 17:31         Assessment & Plan: Pt is a 62 y.o. 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   1.  Severe acute renal failure. 2.  Hyponatremia. 3.  Acute respiratory failure. 4.  Anemia unspecified. 5.  Severe protein calorie malnutrition with severe emaciation.  Plan: The patient remains in a extremely critically ill state.  It appears that the patient's family wants ongoing aggressive care.  His renal function has deteriorated significantly as his BUN is up to 146 with a creatinine of 6.41 despite very little muscle mass.  Serum potassium acceptable at 4.5.  Per the family's request we will proceed with renal placement therapy in the form of hemodialysis.  First treatment will be 1.5 hours with a blood flow rate of 150 and dialysate flow rate of 300.  No ultrafiltration to be planned today.  Thereafter we will plan for the next dialysis treatment on Friday.  Overall patient has an extremely poor prognosis.

## 2017-06-03 NOTE — Progress Notes (Signed)
Pulmonary Critical Care Medicine [facility] [place]   PULMONARY SERVICE  PROGRESS NOTE  Date of Service: 06/03/2017  Melvin Kelley  ZXY:[id number]  DOB: 1955-11-19   DOA: 06/20/2017  Referring Physician: Carron Curie, MD  HPI: Melvin Kelley is a 62 y.o. male seen for follow up of Acute on Chronic Respiratory Failure.  Patient right now is on weaning pressure support currently on 20% oxygen.  Good saturations are noted.  Confines are noted currently is on a pressure of 12/5  Medications: Reviewed on Rounds  Physical Exam:  Vitals: Temperature 96.8 pulse 92 respiratory rate 24 blood pressure 101/55 saturations 98%  Ventilator Settings mode of ventilation pressure support FiO2 20% tidal volume 455 pressure support 12 PEEP 5  . General: Comfortable at this time . Eyes: Grossly normal lids, irises & conjunctiva . ENT: grossly tongue is normal . Neck: no obvious mass . Cardiovascular: S1-S2 normal no gallop or rub . Respiratory: No rhonchi expansion is equal . Abdomen: Soft nondistended . Skin: no rash seen on limited exam . Musculoskeletal: not rigid . Psychiatric:unable to assess . Neurologic: no seizure no involuntary movements         Labs on Admission:  Basic Metabolic Panel: Recent Labs  Lab 05/28/17 0408 06/20/17 0341 06/20/17 1852 05/30/17 1104 06/01/17 0601 06/03/17 0700  NA 138 137 136 132* 127* 117*  K 4.1 3.7 3.7 3.7 4.4 4.5  CL 96* 96* 95* 92* 86* 75*  CO2 30 30 28 25 25 22   GLUCOSE 74 101* 117* 115* 97 135*  BUN 50* 51* 56* 66* 90* 146*  CREATININE 4.42* 4.45* 4.48* 4.36* 5.91* 6.41*  CALCIUM 8.9 9.0 9.2 9.3 8.8* 8.7*  MG 1.9 1.8 1.9  --  2.1  --   PHOS 4.1 4.9* 4.4  --  6.7*  --     Liver Function Tests: Recent Labs  Lab 05/28/17 0408 Jun 20, 2017 0341 Jun 20, 2017 1852 06/01/17 0601  AST 83*  --  56* 49*  ALT 45  --  28 18  ALKPHOS 223*  --  203* 229*  BILITOT 1.0  --  1.1 1.5*  PROT 6.9  --  7.7 8.3*  ALBUMIN 2.6* 2.5* 2.7* 2.9*    No results for input(s): LIPASE, AMYLASE in the last 168 hours. No results for input(s): AMMONIA in the last 168 hours.  CBC: Recent Labs  Lab 05/28/17 0408 Jun 20, 2017 0341 05/30/17 1104 06/01/17 0942 06/03/17 0700  WBC 6.9 4.5 7.2 7.0 7.9  NEUTROABS  --   --  4.5  --   --   HGB 8.4* 8.0* 9.2* 9.3* 9.4*  HCT 25.7* 25.0* 28.0* 27.5* 27.6*  MCV 82.6 84.7 80.9 80.2 79.3  PLT 321 223 213 256 299    Cardiac Enzymes: No results for input(s): CKTOTAL, CKMB, CKMBINDEX, TROPONINI in the last 168 hours.  BNP (last 3 results) No results for input(s): BNP in the last 8760 hours.  ProBNP (last 3 results) No results for input(s): PROBNP in the last 8760 hours.  Radiological Exams on Admission: Dg Neck Soft Tissue  Result Date: 06/01/2017 CLINICAL DATA:  Subcutaneous emphysema. EXAM: NECK SOFT TISSUES - 1+ VIEW COMPARISON:  None. FINDINGS: Nasogastric tube is coiled in the hypopharynx. There is subcutaneous emphysema throughout the neck. Degenerative changes are seen in the spine. Tracheostomy is noted. IMPRESSION: 1. Nasogastric tube is coiled in the hypopharynx. 2. Extensive subcutaneous emphysema. Electronically Signed   By: Leanna Battles M.D.   On: 06/01/2017 07:56   Ct Soft Tissue  Neck Wo Contrast  Result Date: 06/02/2017 CLINICAL DATA:  Pneumothorax and pneumomediastinum EXAM: CT NECK WITHOUT CONTRAST TECHNIQUE: Multidetector CT imaging of the neck was performed following the standard protocol without intravenous contrast. COMPARISON:  None. FINDINGS: PHARYNX AND LARYNX: --Nasopharynx: Fossae of Rosenmuller are clear. Normal adenoid tonsils for age. --Oral cavity and oropharynx: The palatine and lingual tonsils are normal. The visible oral cavity and floor of mouth are normal. --Hypopharynx: Normal vallecula and pyriform sinuses. --Larynx: Normal epiglottis and pre-epiglottic space. Normal aryepiglottic and vocal folds. --Retropharyngeal space: No abscess, effusion or lymphadenopathy.  SALIVARY GLANDS: --Parotid: No mass lesion or inflammation. No sialolithiasis or ductal dilatation. --Submandibular: Symmetric without inflammation. No sialolithiasis or ductal dilatation. --Sublingual: Normal. No ranula or other visible lesion of the base of tongue and floor of mouth. THYROID: Normal. LYMPH NODES: No enlarged or abnormal density lymph nodes. VASCULAR: Limited assessment without IV contrast. LIMITED INTRACRANIAL: Normal. VISUALIZED ORBITS: Normal. MASTOIDS AND VISUALIZED PARANASAL SINUSES: No fluid levels or advanced mucosal thickening. No mastoid effusion. SKELETON: No bony spinal canal stenosis. No lytic or blastic lesions. UPPER CHEST: Biapical emphysema. Extensive subcutaneous emphysema and dissecting emphysema within the deep spaces of the neck. Tracheostomy tube tip is at the level of the clavicular heads. Incompletely visualized pneumomediastinum. Assessment of the trachea is degraded by respiratory motion, but there is asymmetric lucency along its posterior aspect within the upper thorax. The area appears normal on corresponding chest CT. Please see dedicated report. OTHER: None. IMPRESSION: Extensive emphysema of the subcutaneous and deep spaces of the neck with incompletely visualized pneumomediastinum. No causative lesion is identified. Electronically Signed   By: Deatra Robinson M.D.   On: 06/02/2017 18:18   Ct Chest Wo Contrast  Result Date: 06/02/2017 CLINICAL DATA:  62 y/o  M; follow-up of no pneumothorax. EXAM: CT CHEST WITHOUT CONTRAST TECHNIQUE: Multidetector CT imaging of the chest was performed following the standard protocol without IV contrast. COMPARISON:  06/01/2017 chest radiograph FINDINGS: Cardiovascular: Normal heart size. No pericardial effusion. Right central venous catheter tip extends to the right cavoatrial junction. There pacemaking leads within the right atrium and ventricle. Normal caliber thoracic aorta and main pulmonary artery. Mediastinum/Nodes: Subcutaneous  emphysema of the superficial and deep compartments of the lower neck extends into the mediastinum and there is pneumomediastinum. Tracheostomy tube tip terminates in the midtrachea below the clavicles. Enteric tube tip coils in the proximal stomach. No mediastinal adenopathy or fluid collection. Normal thyroid gland. Lungs/Pleura: There is peripheral fibrosis and mild centrilobular emphysema of the lungs. No pneumoperitoneum. Ill-defined ground-glass opacities within the lungs and patchy consolidation in the right lung base may represent areas of atelectasis or pneumonitis. Trace right pleural effusion. Upper Abdomen: Air-filled distention of bowel in the upper abdomen. Musculoskeletal: Chronic sternal and rib fractures. No acute fracture identified. IMPRESSION: 1. Subcutaneous emphysema within the lower neck and small volume pneumomediastinum. No appreciable pneumothorax. 2. Patchy ground-glass opacities as well as small right lower lobe consolidation may represent atelectasis or areas of pneumonitis. Small right effusion. 3. Mild emphysema and peripheral fibrosis of the lungs. 4. Air-filled distention of bowel in the upper abdomen partially visualized. Electronically Signed   By: Mitzi Hansen M.D.   On: 06/02/2017 19:59   Ir Fluoro Guide Cv Line Right  Result Date: 06/02/2017 INDICATION: 62 year old male with acute kidney injury in need of venous access for hemodialysis. EXAM: IR RIGHT FLOURO GUIDE CV LINE; IR ULTRASOUND GUIDANCE VASC ACCESS RIGHT MEDICATIONS: None ANESTHESIA/SEDATION: None FLUOROSCOPY TIME:  Fluoroscopy Time: 0 minutes  30 seconds (1 mGy). COMPLICATIONS: None immediate. PROCEDURE: Informed written consent was obtained from the patient after a thorough discussion of the procedural risks, benefits and alternatives. All questions were addressed. Maximal Sterile Barrier Technique was utilized including caps, mask, sterile gowns, sterile gloves, sterile drape, hand hygiene and skin  antiseptic. A timeout was performed prior to the initiation of the procedure. The right subclavian vein was interrogated with ultrasound and found to be widely patent. An image was obtained and stored for the medical record. Local anesthesia was attained by infiltration with 1% lidocaine. A small dermatotomy was made. Under real-time sonographic guidance, the vessel was punctured with a 21 gauge micropuncture needle. Using standard technique, the initial micro needle was exchanged over a 0.018 micro wire for a transitional 4 [place] micro sheath. The micro sheath was then exchanged over a 0.035 wire in the skin tract was dilated to 13 [place]. A [place] non tunneled hemodialysis catheter was then advanced over the wire and positioned with the tip in the right atrium. The catheter flushes and aspirates with ease. The catheter was secured to the skin with 0 Prolene suture. Sterile bandages were applied. IMPRESSION: Placement of a right subclavian approach 20 cm Trialysis catheter. Electronically Signed   By: Malachy Moan M.D.   On: 06/02/2017 17:31   Ir US Guide Vasc Access Right  Result Date: 06/02/2017 INDICATION: 62 year old male with acute kidney injury in need of venous access for hemodialysis. EXAM: IR RIGHT FLOURO GUIDE CV LINE; IR ULTRASOUND GUIDANCE VASC ACCESS RIGHT MEDICATIONS: None ANESTHESIA/SEDATION: None FLUOROSCOPY TIME:  Fluoroscopy Time: 0 minutes 30 seconds (1 mGy). COMPLICATIONS: None immediate. PROCEDURE: Informed written consent was obtained from the patient after a thorough discussion of the procedural risks, benefits and alternatives. All questions were addressed. Maximal Sterile Barrier Technique was utilized including caps, mask, sterile gowns, sterile gloves, sterile drape, hand hygiene and skin antiseptic. A timeout was performed prior to the initiation of the procedure. The right subclavian vein was interrogated with ultrasound and found to be widely patent. An image was obtained  and stored for the medical record. Local anesthesia was attained by infiltration with 1% lidocaine. A small dermatotomy was made. Under real-time sonographic guidance, the vessel was punctured with a 21 gauge micropuncture needle. Using standard technique, the initial micro needle was exchanged over a 0.018 micro wire for a transitional 4 [place] micro sheath. The micro sheath was then exchanged over a 0.035 wire in the skin tract was dilated to 13 [place]. A [place] non tunneled hemodialysis catheter was then advanced over the wire and positioned with the tip in the right atrium. The catheter flushes and aspirates with ease. The catheter was secured to the skin with 0 Prolene suture. Sterile bandages were applied. IMPRESSION: Placement of a right subclavian approach 20 cm Trialysis catheter. Electronically Signed   By: Malachy Moan M.D.   On: 06/02/2017 17:31   Dg Chest Port 1 View  Result Date: 06/01/2017 CLINICAL DATA:  Subcutaneous emphysema. EXAM: PORTABLE CHEST 1 VIEW COMPARISON:  Two-view chest x-ray 05/31/2017 FINDINGS: Heart size is normal. Tracheostomy tube is in place. NG tube extends into the stomach. Pacing and defibrillator wires are noted. A right-sided PICC line is stable. Subcutaneous emphysema in the neck is improving. Lung volumes have improved. Right middle lobe or superior segment lower lobe airspace disease is again seen. There is diffuse gaseous distention of bowel. IMPRESSION: 1. Improving aeration of both lungs. 2. Airspace disease on the right representing atelectasis or infection. 3.  Improving subcutaneous emphysema. 4. Diffuse gaseous distention of bowel. Electronically Signed   By: Marin Roberts M.D.   On: 06/01/2017 07:57   Dg Chest Port 1 View  Result Date: 05/31/2017 CLINICAL DATA:  Subcutaneous emphysema EXAM: PORTABLE CHEST 1 VIEW COMPARISON:  06-25-2017, 05/28/2017, 05/27/2017 FINDINGS: Tracheostomy tube is in place. Esophageal tube tip overlies the proximal  stomach. Possible coiling of the proximal tube over the neck. Right upper extremity catheter tip overlies the distal SVC. Left-sided pacing device, similar appearance. Small amount of subcutaneous emphysema at the right supraclavicular fossa and right neck. Possible small amount of subcutaneous emphysema or pneumomediastinum base of left neck. Postsurgical changes at the left base. Low lung volumes. Elevated left diaphragm. Scarring in the right lower lung. Stable cardiomediastinal silhouette. Air distended bowel in the upper abdomen IMPRESSION: 1. Small amount of subcutaneous emphysema at the right neck and right supraclavicular fossa. Small focus of supraclavicular fossa emphysema versus small amount of pneumomediastinum at the base of the left neck. 2. Low lung volumes with elevated left diaphragm and scarring in the right lower lung. Air distended bowel as before. 3. Esophageal tube tip overlies the left upper quadrant. Possible coiling of proximal tube at the base of the neck. Electronically Signed   By: Jasmine Pang M.D.   On: 05/31/2017 19:56   Dg Abd Portable 1v  Result Date: 06/01/2017 CLINICAL DATA:  Orogastric tube placement. EXAM: PORTABLE ABDOMEN - 1 VIEW COMPARISON:  Radiograph of May 31, 2017. FINDINGS: Distal tip of orogastric tube is seen in proximal stomach. Stable bowel dilatation is noted consistent with ileus. Surgical clips are seen in left upper quadrant. IMPRESSION: Distal tip of orogastric tube seen in proximal stomach. Stable bowel dilatation is noted consistent with ileus. Electronically Signed   By: Lupita Raider, M.D.   On: 06/01/2017 13:41   Dg Abd Portable 1v  Result Date: 05/31/2017 CLINICAL DATA:  Ileus EXAM: PORTABLE ABDOMEN - 1 VIEW COMPARISON:  Plain films of the abdomen dated 06/25/2017 and 05/22/2017. FINDINGS: Diffuse marked gaseous bowel distension persists, not appreciably changed compared to the previous exams. Enteric tube is stable in position with tip  directed towards the stomach fundus. Surgical changes again noted within the central abdomen and LEFT upper quadrant. IMPRESSION: Stable appearance of the bowel, with diffuse marked gaseous bowel distension, compatible with given history of ileus. Electronically Signed   By: Bary Richard M.D.   On: 05/31/2017 09:17    Assessment/Plan Active Problems:   Cardiac arrest (HCC)   Acute on chronic respiratory failure (HCC)   DNR (do not resuscitate) discussion   AKI (acute kidney injury) (HCC)   CHF (congestive heart failure) (HCC)   Obstructive sleep apnea   Acute kidney injury (HCC)   1. Acute on chronic respiratory failure with hypoxia at this time patient is on weaning protocol the goal is for 8 hours today on pressure support we will continue with supportive care 2. Acute kidney injury patient is being followed by nephrology dialysis catheter placed hopefully will be starting dialysis 3. Congestive heart failure we will continue supportive care remove fluid as noted by dialysis 4. Status post cardiac arrest he is at baseline 5. Sleep apnea not an issue at this time   I have personally seen and evaluated the patient, evaluated laboratory and imaging results, formulated the assessment and plan and placed orders. The Patient requires high complexity decision making for assessment and support.  Case was discussed on Rounds with the Respiratory Therapy Staff  Allyne Gee, MD [facility] Pulmonary Critical Care Medicine Sleep Medicine

## 2017-06-04 LAB — COMPREHENSIVE METABOLIC PANEL
ALT: 14 U/L — AB (ref 17–63)
AST: 45 U/L — ABNORMAL HIGH (ref 15–41)
Albumin: 2.7 g/dL — ABNORMAL LOW (ref 3.5–5.0)
Alkaline Phosphatase: 229 U/L — ABNORMAL HIGH (ref 38–126)
Anion gap: 14 (ref 5–15)
BUN: 121 mg/dL — ABNORMAL HIGH (ref 6–20)
CHLORIDE: 86 mmol/L — AB (ref 101–111)
CO2: 24 mmol/L (ref 22–32)
CREATININE: 4.83 mg/dL — AB (ref 0.61–1.24)
Calcium: 8.7 mg/dL — ABNORMAL LOW (ref 8.9–10.3)
GFR calc Af Amer: 14 mL/min — ABNORMAL LOW (ref >60)
GFR, EST NON AFRICAN AMERICAN: 12 mL/min — AB (ref >60)
Glucose, Bld: 120 mg/dL — ABNORMAL HIGH (ref 65–99)
POTASSIUM: 4.3 mmol/L (ref 3.5–5.1)
Sodium: 124 mmol/L — ABNORMAL LOW (ref 135–145)
Total Bilirubin: 1.1 mg/dL (ref 0.3–1.2)
Total Protein: 7.9 g/dL (ref 6.5–8.1)

## 2017-06-04 LAB — HEPATITIS B SURFACE ANTIGEN: HEP B S AG: NEGATIVE

## 2017-06-04 LAB — HEPATITIS B CORE ANTIBODY, IGM: Hep B C IgM: NEGATIVE

## 2017-06-04 LAB — PHOSPHORUS: PHOSPHORUS: 7.5 mg/dL — AB (ref 2.5–4.6)

## 2017-06-04 LAB — MAGNESIUM: MAGNESIUM: 2.3 mg/dL (ref 1.7–2.4)

## 2017-06-04 LAB — HEPATITIS B SURFACE ANTIBODY, QUANTITATIVE

## 2017-06-04 NOTE — Progress Notes (Signed)
Pulmonary Critical Care Medicine [facility] [place]   PULMONARY SERVICE  PROGRESS NOTE  Date of Service: 06/04/2017  Melvin Kelley  CEY:[id number]  DOB: 27-Jan-1956   DOA: 06/14/17  Referring Physician: Carron Curie, MD  HPI: Melvin Kelley is a 62 y.o. male seen for follow up of Acute on Chronic Respiratory Failure.  Remains on pressure support mode currently on 20% oxygen with good volumes  Medications: Reviewed on Rounds  Physical Exam:  Vitals: Temperature 97.7 pulse 92 menstrual rate 17 blood pressure 100/68 saturations 98%  Ventilator Settings mode ventilation pressure support FiO2 21% tidal volume 450 pressure support 12 PEEP 5  . General: Comfortable at this time . Eyes: Grossly normal lids, irises & conjunctiva . ENT: grossly tongue is normal . Neck: no obvious mass . Cardiovascular: S1-S2 normal no gallop or rub . Respiratory: Scattered distant . Abdomen: Soft nontender . Skin: no rash seen on limited exam . Musculoskeletal: not rigid . Psychiatric:unable to assess . Neurologic: no seizure no involuntary movements         Labs on Admission:  Basic Metabolic Panel: Recent Labs  Lab 2017-06-14 0341 2017/06/14 1852 05/30/17 1104 06/01/17 0601 06/03/17 0700 06/03/17 1553 06/04/17 0513  NA 137 136 132* 127* 117* 116* 124*  K 3.7 3.7 3.7 4.4 4.5 5.0 4.3  CL 96* 95* 92* 86* 75* 74* 86*  CO2 30 28 25 25 22 22 24   GLUCOSE 101* 117* 115* 97 135* 125* 120*  BUN 51* 56* 66* 90* 146* 146* 121*  CREATININE 4.45* 4.48* 4.36* 5.91* 6.41* 6.28* 4.83*  CALCIUM 9.0 9.2 9.3 8.8* 8.7* 8.7* 8.7*  MG 1.8 1.9  --  2.1  --   --  2.3  PHOS 4.9* 4.4  --  6.7*  --  10.2* 7.5*    Liver Function Tests: Recent Labs  Lab June 14, 2017 0341 06-14-17 1852 06/01/17 0601 06/03/17 1553 06/04/17 0513  AST  --  56* 49*  --  45*  ALT  --  28 18  --  14*  ALKPHOS  --  203* 229*  --  229*  BILITOT  --  1.1 1.5*  --  1.1  PROT  --  7.7 8.3*  --  7.9  ALBUMIN 2.5* 2.7*  2.9* 2.7* 2.7*   No results for input(s): LIPASE, AMYLASE in the last 168 hours. No results for input(s): AMMONIA in the last 168 hours.  CBC: Recent Labs  Lab June 14, 2017 0341 05/30/17 1104 06/01/17 0942 06/03/17 0700 06/03/17 1553  WBC 4.5 7.2 7.0 7.9 6.9  NEUTROABS  --  4.5  --   --   --   HGB 8.0* 9.2* 9.3* 9.4* 9.2*  HCT 25.0* 28.0* 27.5* 27.6* 27.1*  MCV 84.7 80.9 80.2 79.3 78.3  PLT 223 213 256 299 262    Cardiac Enzymes: No results for input(s): CKTOTAL, CKMB, CKMBINDEX, TROPONINI in the last 168 hours.  BNP (last 3 results) No results for input(s): BNP in the last 8760 hours.  ProBNP (last 3 results) No results for input(s): PROBNP in the last 8760 hours.  Radiological Exams on Admission: Dg Neck Soft Tissue  Result Date: 06/01/2017 CLINICAL DATA:  Subcutaneous emphysema. EXAM: NECK SOFT TISSUES - 1+ VIEW COMPARISON:  None. FINDINGS: Nasogastric tube is coiled in the hypopharynx. There is subcutaneous emphysema throughout the neck. Degenerative changes are seen in the spine. Tracheostomy is noted. IMPRESSION: 1. Nasogastric tube is coiled in the hypopharynx. 2. Extensive subcutaneous emphysema. Electronically Signed   By: Juliette Alcide  Blietz M.D.   On: 06/01/2017 07:56   Ct Soft Tissue Neck Wo Contrast  Result Date: 06/02/2017 CLINICAL DATA:  Pneumothorax and pneumomediastinum EXAM: CT NECK WITHOUT CONTRAST TECHNIQUE: Multidetector CT imaging of the neck was performed following the standard protocol without intravenous contrast. COMPARISON:  None. FINDINGS: PHARYNX AND LARYNX: --Nasopharynx: Fossae of Rosenmuller are clear. Normal adenoid tonsils for age. --Oral cavity and oropharynx: The palatine and lingual tonsils are normal. The visible oral cavity and floor of mouth are normal. --Hypopharynx: Normal vallecula and pyriform sinuses. --Larynx: Normal epiglottis and pre-epiglottic space. Normal aryepiglottic and vocal folds. --Retropharyngeal space: No abscess, effusion or  lymphadenopathy. SALIVARY GLANDS: --Parotid: No mass lesion or inflammation. No sialolithiasis or ductal dilatation. --Submandibular: Symmetric without inflammation. No sialolithiasis or ductal dilatation. --Sublingual: Normal. No ranula or other visible lesion of the base of tongue and floor of mouth. THYROID: Normal. LYMPH NODES: No enlarged or abnormal density lymph nodes. VASCULAR: Limited assessment without IV contrast. LIMITED INTRACRANIAL: Normal. VISUALIZED ORBITS: Normal. MASTOIDS AND VISUALIZED PARANASAL SINUSES: No fluid levels or advanced mucosal thickening. No mastoid effusion. SKELETON: No bony spinal canal stenosis. No lytic or blastic lesions. UPPER CHEST: Biapical emphysema. Extensive subcutaneous emphysema and dissecting emphysema within the deep spaces of the neck. Tracheostomy tube tip is at the level of the clavicular heads. Incompletely visualized pneumomediastinum. Assessment of the trachea is degraded by respiratory motion, but there is asymmetric lucency along its posterior aspect within the upper thorax. The area appears normal on corresponding chest CT. Please see dedicated report. OTHER: None. IMPRESSION: Extensive emphysema of the subcutaneous and deep spaces of the neck with incompletely visualized pneumomediastinum. No causative lesion is identified. Electronically Signed   By: Deatra Robinson M.D.   On: 06/02/2017 18:18   Ct Chest Wo Contrast  Result Date: 06/02/2017 CLINICAL DATA:  62 y/o  M; follow-up of no pneumothorax. EXAM: CT CHEST WITHOUT CONTRAST TECHNIQUE: Multidetector CT imaging of the chest was performed following the standard protocol without IV contrast. COMPARISON:  06/01/2017 chest radiograph FINDINGS: Cardiovascular: Normal heart size. No pericardial effusion. Right central venous catheter tip extends to the right cavoatrial junction. There pacemaking leads within the right atrium and ventricle. Normal caliber thoracic aorta and main pulmonary artery.  Mediastinum/Nodes: Subcutaneous emphysema of the superficial and deep compartments of the lower neck extends into the mediastinum and there is pneumomediastinum. Tracheostomy tube tip terminates in the midtrachea below the clavicles. Enteric tube tip coils in the proximal stomach. No mediastinal adenopathy or fluid collection. Normal thyroid gland. Lungs/Pleura: There is peripheral fibrosis and mild centrilobular emphysema of the lungs. No pneumoperitoneum. Ill-defined ground-glass opacities within the lungs and patchy consolidation in the right lung base may represent areas of atelectasis or pneumonitis. Trace right pleural effusion. Upper Abdomen: Air-filled distention of bowel in the upper abdomen. Musculoskeletal: Chronic sternal and rib fractures. No acute fracture identified. IMPRESSION: 1. Subcutaneous emphysema within the lower neck and small volume pneumomediastinum. No appreciable pneumothorax. 2. Patchy ground-glass opacities as well as small right lower lobe consolidation may represent atelectasis or areas of pneumonitis. Small right effusion. 3. Mild emphysema and peripheral fibrosis of the lungs. 4. Air-filled distention of bowel in the upper abdomen partially visualized. Electronically Signed   By: Mitzi Hansen M.D.   On: 06/02/2017 19:59   Ir Fluoro Guide Cv Line Right  Result Date: 06/02/2017 INDICATION: 62 year old male with acute kidney injury in need of venous access for hemodialysis. EXAM: IR RIGHT FLOURO GUIDE CV LINE; IR ULTRASOUND GUIDANCE VASC ACCESS  RIGHT MEDICATIONS: None ANESTHESIA/SEDATION: None FLUOROSCOPY TIME:  Fluoroscopy Time: 0 minutes 30 seconds (1 mGy). COMPLICATIONS: None immediate. PROCEDURE: Informed written consent was obtained from the patient after a thorough discussion of the procedural risks, benefits and alternatives. All questions were addressed. Maximal Sterile Barrier Technique was utilized including caps, mask, sterile gowns, sterile gloves, sterile  drape, hand hygiene and skin antiseptic. A timeout was performed prior to the initiation of the procedure. The right subclavian vein was interrogated with ultrasound and found to be widely patent. An image was obtained and stored for the medical record. Local anesthesia was attained by infiltration with 1% lidocaine. A small dermatotomy was made. Under real-time sonographic guidance, the vessel was punctured with a 21 gauge micropuncture needle. Using standard technique, the initial micro needle was exchanged over a 0.018 micro wire for a transitional 4 [place] micro sheath. The micro sheath was then exchanged over a 0.035 wire in the skin tract was dilated to 13 [place]. A [place] non tunneled hemodialysis catheter was then advanced over the wire and positioned with the tip in the right atrium. The catheter flushes and aspirates with ease. The catheter was secured to the skin with 0 Prolene suture. Sterile bandages were applied. IMPRESSION: Placement of a right subclavian approach 20 cm Trialysis catheter. Electronically Signed   By: Malachy Moan M.D.   On: 06/02/2017 17:31   Ir US Guide Vasc Access Right  Result Date: 06/02/2017 INDICATION: 62 year old male with acute kidney injury in need of venous access for hemodialysis. EXAM: IR RIGHT FLOURO GUIDE CV LINE; IR ULTRASOUND GUIDANCE VASC ACCESS RIGHT MEDICATIONS: None ANESTHESIA/SEDATION: None FLUOROSCOPY TIME:  Fluoroscopy Time: 0 minutes 30 seconds (1 mGy). COMPLICATIONS: None immediate. PROCEDURE: Informed written consent was obtained from the patient after a thorough discussion of the procedural risks, benefits and alternatives. All questions were addressed. Maximal Sterile Barrier Technique was utilized including caps, mask, sterile gowns, sterile gloves, sterile drape, hand hygiene and skin antiseptic. A timeout was performed prior to the initiation of the procedure. The right subclavian vein was interrogated with ultrasound and found to be widely  patent. An image was obtained and stored for the medical record. Local anesthesia was attained by infiltration with 1% lidocaine. A small dermatotomy was made. Under real-time sonographic guidance, the vessel was punctured with a 21 gauge micropuncture needle. Using standard technique, the initial micro needle was exchanged over a 0.018 micro wire for a transitional 4 [place] micro sheath. The micro sheath was then exchanged over a 0.035 wire in the skin tract was dilated to 13 [place]. A [place] non tunneled hemodialysis catheter was then advanced over the wire and positioned with the tip in the right atrium. The catheter flushes and aspirates with ease. The catheter was secured to the skin with 0 Prolene suture. Sterile bandages were applied. IMPRESSION: Placement of a right subclavian approach 20 cm Trialysis catheter. Electronically Signed   By: Malachy Moan M.D.   On: 06/02/2017 17:31   Dg Chest Port 1 View  Result Date: 06/01/2017 CLINICAL DATA:  Subcutaneous emphysema. EXAM: PORTABLE CHEST 1 VIEW COMPARISON:  Two-view chest x-ray 05/31/2017 FINDINGS: Heart size is normal. Tracheostomy tube is in place. NG tube extends into the stomach. Pacing and defibrillator wires are noted. A right-sided PICC line is stable. Subcutaneous emphysema in the neck is improving. Lung volumes have improved. Right middle lobe or superior segment lower lobe airspace disease is again seen. There is diffuse gaseous distention of bowel. IMPRESSION: 1. Improving aeration of both  lungs. 2. Airspace disease on the right representing atelectasis or infection. 3. Improving subcutaneous emphysema. 4. Diffuse gaseous distention of bowel. Electronically Signed   By: Marin Roberts M.D.   On: 06/01/2017 07:57   Dg Chest Port 1 View  Result Date: 05/31/2017 CLINICAL DATA:  Subcutaneous emphysema EXAM: PORTABLE CHEST 1 VIEW COMPARISON:  Jun 22, 2017, 05/28/2017, 05/27/2017 FINDINGS: Tracheostomy tube is in place. Esophageal tube  tip overlies the proximal stomach. Possible coiling of the proximal tube over the neck. Right upper extremity catheter tip overlies the distal SVC. Left-sided pacing device, similar appearance. Small amount of subcutaneous emphysema at the right supraclavicular fossa and right neck. Possible small amount of subcutaneous emphysema or pneumomediastinum base of left neck. Postsurgical changes at the left base. Low lung volumes. Elevated left diaphragm. Scarring in the right lower lung. Stable cardiomediastinal silhouette. Air distended bowel in the upper abdomen IMPRESSION: 1. Small amount of subcutaneous emphysema at the right neck and right supraclavicular fossa. Small focus of supraclavicular fossa emphysema versus small amount of pneumomediastinum at the base of the left neck. 2. Low lung volumes with elevated left diaphragm and scarring in the right lower lung. Air distended bowel as before. 3. Esophageal tube tip overlies the left upper quadrant. Possible coiling of proximal tube at the base of the neck. Electronically Signed   By: Jasmine Pang M.D.   On: 05/31/2017 19:56   Dg Abd Portable 1v  Result Date: 06/01/2017 CLINICAL DATA:  Orogastric tube placement. EXAM: PORTABLE ABDOMEN - 1 VIEW COMPARISON:  Radiograph of May 31, 2017. FINDINGS: Distal tip of orogastric tube is seen in proximal stomach. Stable bowel dilatation is noted consistent with ileus. Surgical clips are seen in left upper quadrant. IMPRESSION: Distal tip of orogastric tube seen in proximal stomach. Stable bowel dilatation is noted consistent with ileus. Electronically Signed   By: Lupita Raider, M.D.   On: 06/01/2017 13:41    Assessment/Plan Active Problems:   Cardiac arrest (HCC)   Acute on chronic respiratory failure (HCC)   DNR (do not resuscitate) discussion   AKI (acute kidney injury) (HCC)   CHF (congestive heart failure) (HCC)   Obstructive sleep apnea   Acute kidney injury (HCC)   1. Acute on chronic respiratory  failure with hypoxia we will continue with pressure support mode titrate oxygen continue pulmonary toilet and follow along 2. Status post cardiac arrest remains grossly unchanged nonverbal 3. Chronic congestive heart failure at this time patient is compensated we will continue to follow along 4. Acute kidney injury patient is being seen by nephrology for dialysis will continue supportive care 5. Obstructive sleep apnea by apnea syndrome not an issue at this time   I have personally seen and evaluated the patient, evaluated laboratory and imaging results, formulated the assessment and plan and placed orders. The Patient requires high complexity decision making for assessment and support.  Case was discussed on Rounds with the Respiratory Therapy Staff  Yevonne Pax, MD [facility] Pulmonary Critical Care Medicine Sleep Medicine

## 2017-06-05 LAB — RENAL FUNCTION PANEL
ALBUMIN: 2.7 g/dL — AB (ref 3.5–5.0)
ANION GAP: 17 — AB (ref 5–15)
BUN: 141 mg/dL — ABNORMAL HIGH (ref 6–20)
CALCIUM: 8.8 mg/dL — AB (ref 8.9–10.3)
CO2: 21 mmol/L — ABNORMAL LOW (ref 22–32)
Chloride: 84 mmol/L — ABNORMAL LOW (ref 101–111)
Creatinine, Ser: 4.46 mg/dL — ABNORMAL HIGH (ref 0.61–1.24)
GFR, EST AFRICAN AMERICAN: 15 mL/min — AB (ref >60)
GFR, EST NON AFRICAN AMERICAN: 13 mL/min — AB (ref >60)
GLUCOSE: 108 mg/dL — AB (ref 65–99)
PHOSPHORUS: 8 mg/dL — AB (ref 2.5–4.6)
POTASSIUM: 4.4 mmol/L (ref 3.5–5.1)
Sodium: 122 mmol/L — ABNORMAL LOW (ref 135–145)

## 2017-06-05 LAB — CBC
HCT: 22.9 % — ABNORMAL LOW (ref 39.0–52.0)
HEMOGLOBIN: 7.5 g/dL — AB (ref 13.0–17.0)
MCH: 26.2 pg (ref 26.0–34.0)
MCHC: 32.8 g/dL (ref 30.0–36.0)
MCV: 80.1 fL (ref 78.0–100.0)
PLATELETS: 211 10*3/uL (ref 150–400)
RBC: 2.86 MIL/uL — AB (ref 4.22–5.81)
RDW: 16.8 % — ABNORMAL HIGH (ref 11.5–15.5)
WBC: 6.7 10*3/uL (ref 4.0–10.5)

## 2017-06-05 LAB — TRIGLYCERIDES: TRIGLYCERIDES: 45 mg/dL (ref <150)

## 2017-06-05 NOTE — Progress Notes (Signed)
Pulmonary Critical Care Medicine [facility] [place]   PULMONARY SERVICE  PROGRESS NOTE  Date of Service: 06/05/2017  Melvin Kelley  ZMO:[id number]  DOB: February 26, 1955   DOA: 2017-06-27  Referring Physician: Carron Curie, MD  HPI: Melvin Kelley is a 62 y.o. male seen for follow up of Acute on Chronic Respiratory Failure.  Continues to wean on pressure support looks good this morning.  No distress was noted.  Patient was on 28% oxygen  Medications: Reviewed on Rounds  Physical Exam:  Vitals: Temperature 97.8 pulse 84 respiratory rate 16 blood pressure 94/64 saturations 98%  Ventilator Settings mode of ventilation pressure support FiO2 20% tidal volume 450 pressure support 12 PEEP 5  . General: Comfortable at this time . Eyes: Grossly normal lids, irises & conjunctiva . ENT: grossly tongue is normal . Neck: no obvious mass . Cardiovascular: S1-S2 normal no gallop or rub . Respiratory: No rhonchi expansion is equal . Abdomen: Soft nontender . Skin: no rash seen on limited exam . Musculoskeletal: not rigid . Psychiatric:unable to assess . Neurologic: no seizure no involuntary movements         Labs on Admission:  Basic Metabolic Panel: Recent Labs  Lab 06-27-2017 1852  06/01/17 0601 06/03/17 0700 06/03/17 1553 06/04/17 0513 06/05/17 0803  NA 136   < > 127* 117* 116* 124* 122*  K 3.7   < > 4.4 4.5 5.0 4.3 4.4  CL 95*   < > 86* 75* 74* 86* 84*  CO2 28   < > 25 22 22 24  21*  GLUCOSE 117*   < > 97 135* 125* 120* 108*  BUN 56*   < > 90* 146* 146* 121* 141*  CREATININE 4.48*   < > 5.91* 6.41* 6.28* 4.83* 4.46*  CALCIUM 9.2   < > 8.8* 8.7* 8.7* 8.7* 8.8*  MG 1.9  --  2.1  --   --  2.3  --   PHOS 4.4  --  6.7*  --  10.2* 7.5* 8.0*   < > = values in this interval not displayed.    Liver Function Tests: Recent Labs  Lab 27-Jun-2017 1852 06/01/17 0601 06/03/17 1553 06/04/17 0513 06/05/17 0803  AST 56* 49*  --  45*  --   ALT 28 18  --  14*  --   ALKPHOS 203*  229*  --  229*  --   BILITOT 1.1 1.5*  --  1.1  --   PROT 7.7 8.3*  --  7.9  --   ALBUMIN 2.7* 2.9* 2.7* 2.7* 2.7*   No results for input(s): LIPASE, AMYLASE in the last 168 hours. No results for input(s): AMMONIA in the last 168 hours.  CBC: Recent Labs  Lab 05/30/17 1104 06/01/17 0942 06/03/17 0700 06/03/17 1553 06/05/17 0943  WBC 7.2 7.0 7.9 6.9 6.7  NEUTROABS 4.5  --   --   --   --   HGB 9.2* 9.3* 9.4* 9.2* 7.5*  HCT 28.0* 27.5* 27.6* 27.1* 22.9*  MCV 80.9 80.2 79.3 78.3 80.1  PLT 213 256 299 262 211    Cardiac Enzymes: No results for input(s): CKTOTAL, CKMB, CKMBINDEX, TROPONINI in the last 168 hours.  BNP (last 3 results) No results for input(s): BNP in the last 8760 hours.  ProBNP (last 3 results) No results for input(s): PROBNP in the last 8760 hours.  Radiological Exams on Admission: Ct Soft Tissue Neck Wo Contrast  Result Date: 06/02/2017 CLINICAL DATA:  Pneumothorax and pneumomediastinum EXAM: CT NECK  WITHOUT CONTRAST TECHNIQUE: Multidetector CT imaging of the neck was performed following the standard protocol without intravenous contrast. COMPARISON:  None. FINDINGS: PHARYNX AND LARYNX: --Nasopharynx: Fossae of Rosenmuller are clear. Normal adenoid tonsils for age. --Oral cavity and oropharynx: The palatine and lingual tonsils are normal. The visible oral cavity and floor of mouth are normal. --Hypopharynx: Normal vallecula and pyriform sinuses. --Larynx: Normal epiglottis and pre-epiglottic space. Normal aryepiglottic and vocal folds. --Retropharyngeal space: No abscess, effusion or lymphadenopathy. SALIVARY GLANDS: --Parotid: No mass lesion or inflammation. No sialolithiasis or ductal dilatation. --Submandibular: Symmetric without inflammation. No sialolithiasis or ductal dilatation. --Sublingual: Normal. No ranula or other visible lesion of the base of tongue and floor of mouth. THYROID: Normal. LYMPH NODES: No enlarged or abnormal density lymph nodes. VASCULAR:  Limited assessment without IV contrast. LIMITED INTRACRANIAL: Normal. VISUALIZED ORBITS: Normal. MASTOIDS AND VISUALIZED PARANASAL SINUSES: No fluid levels or advanced mucosal thickening. No mastoid effusion. SKELETON: No bony spinal canal stenosis. No lytic or blastic lesions. UPPER CHEST: Biapical emphysema. Extensive subcutaneous emphysema and dissecting emphysema within the deep spaces of the neck. Tracheostomy tube tip is at the level of the clavicular heads. Incompletely visualized pneumomediastinum. Assessment of the trachea is degraded by respiratory motion, but there is asymmetric lucency along its posterior aspect within the upper thorax. The area appears normal on corresponding chest CT. Please see dedicated report. OTHER: None. IMPRESSION: Extensive emphysema of the subcutaneous and deep spaces of the neck with incompletely visualized pneumomediastinum. No causative lesion is identified. Electronically Signed   By: Deatra Robinson M.D.   On: 06/02/2017 18:18   Ct Chest Wo Contrast  Result Date: 06/02/2017 CLINICAL DATA:  62 y/o  M; follow-up of no pneumothorax. EXAM: CT CHEST WITHOUT CONTRAST TECHNIQUE: Multidetector CT imaging of the chest was performed following the standard protocol without IV contrast. COMPARISON:  06/01/2017 chest radiograph FINDINGS: Cardiovascular: Normal heart size. No pericardial effusion. Right central venous catheter tip extends to the right cavoatrial junction. There pacemaking leads within the right atrium and ventricle. Normal caliber thoracic aorta and main pulmonary artery. Mediastinum/Nodes: Subcutaneous emphysema of the superficial and deep compartments of the lower neck extends into the mediastinum and there is pneumomediastinum. Tracheostomy tube tip terminates in the midtrachea below the clavicles. Enteric tube tip coils in the proximal stomach. No mediastinal adenopathy or fluid collection. Normal thyroid gland. Lungs/Pleura: There is peripheral fibrosis and mild  centrilobular emphysema of the lungs. No pneumoperitoneum. Ill-defined ground-glass opacities within the lungs and patchy consolidation in the right lung base may represent areas of atelectasis or pneumonitis. Trace right pleural effusion. Upper Abdomen: Air-filled distention of bowel in the upper abdomen. Musculoskeletal: Chronic sternal and rib fractures. No acute fracture identified. IMPRESSION: 1. Subcutaneous emphysema within the lower neck and small volume pneumomediastinum. No appreciable pneumothorax. 2. Patchy ground-glass opacities as well as small right lower lobe consolidation may represent atelectasis or areas of pneumonitis. Small right effusion. 3. Mild emphysema and peripheral fibrosis of the lungs. 4. Air-filled distention of bowel in the upper abdomen partially visualized. Electronically Signed   By: Mitzi Hansen M.D.   On: 06/02/2017 19:59   Ir Fluoro Guide Cv Line Right  Result Date: 06/02/2017 INDICATION: 62 year old male with acute kidney injury in need of venous access for hemodialysis. EXAM: IR RIGHT FLOURO GUIDE CV LINE; IR ULTRASOUND GUIDANCE VASC ACCESS RIGHT MEDICATIONS: None ANESTHESIA/SEDATION: None FLUOROSCOPY TIME:  Fluoroscopy Time: 0 minutes 30 seconds (1 mGy). COMPLICATIONS: None immediate. PROCEDURE: Informed written consent was obtained from the patient  after a thorough discussion of the procedural risks, benefits and alternatives. All questions were addressed. Maximal Sterile Barrier Technique was utilized including caps, mask, sterile gowns, sterile gloves, sterile drape, hand hygiene and skin antiseptic. A timeout was performed prior to the initiation of the procedure. The right subclavian vein was interrogated with ultrasound and found to be widely patent. An image was obtained and stored for the medical record. Local anesthesia was attained by infiltration with 1% lidocaine. A small dermatotomy was made. Under real-time sonographic guidance, the vessel was  punctured with a 21 gauge micropuncture needle. Using standard technique, the initial micro needle was exchanged over a 0.018 micro wire for a transitional 4 [place] micro sheath. The micro sheath was then exchanged over a 0.035 wire in the skin tract was dilated to 13 [place]. A [place]kur non tunneled hemodialysis catheter was then advanced over the wire and positioned with the tip in the right atrium. The catheter flushes and aspirates with ease. The catheter was secured to the skin with 0 Prolene suture. Sterile bandages were applied. IMPRESSION: Placement of a right subclavian approach 20 cm Trialysis catheter. Electronically Signed   By: Malachy Moan M.D.   On: 06/02/2017 17:31   Ir US Guide Vasc Access Right  Result Date: 06/02/2017 INDICATION: 62 year old male with acute kidney injury in need of venous access for hemodialysis. EXAM: IR RIGHT FLOURO GUIDE CV LINE; IR ULTRASOUND GUIDANCE VASC ACCESS RIGHT MEDICATIONS: None ANESTHESIA/SEDATION: None FLUOROSCOPY TIME:  Fluoroscopy Time: 0 minutes 30 seconds (1 mGy). COMPLICATIONS: None immediate. PROCEDURE: Informed written consent was obtained from the patient after a thorough discussion of the procedural risks, benefits and alternatives. All questions were addressed. Maximal Sterile Barrier Technique was utilized including caps, mask, sterile gowns, sterile gloves, sterile drape, hand hygiene and skin antiseptic. A timeout was performed prior to the initiation of the procedure. The right subclavian vein was interrogated with ultrasound and found to be widely patent. An image was obtained and stored for the medical record. Local anesthesia was attained by infiltration with 1% lidocaine. A small dermatotomy was made. Under real-time sonographic guidance, the vessel was punctured with a 21 gauge micropuncture needle. Using standard technique, the initial micro needle was exchanged over a 0.018 micro wire for a transitional 4 [place] micro sheath. The micro  sheath was then exchanged over a 0.035 wire in the skin tract was dilated to 13 [place]. A [place]kur non tunneled hemodialysis catheter was then advanced over the wire and positioned with the tip in the right atrium. The catheter flushes and aspirates with ease. The catheter was secured to the skin with 0 Prolene suture. Sterile bandages were applied. IMPRESSION: Placement of a right subclavian approach 20 cm Trialysis catheter. Electronically Signed   By: Malachy Moan M.D.   On: 06/02/2017 17:31    Assessment/Plan Active Problems:   Cardiac arrest (HCC)   Acute on chronic respiratory failure (HCC)   DNR (do not resuscitate) discussion   AKI (acute kidney injury) (HCC)   CHF (congestive heart failure) (HCC)   Obstructive sleep apnea   Acute kidney injury (HCC)   1. Acute on chronic respiratory failure with hypoxia tolerating the wean well so far we will continue to advance as tolerated continue pulmonary toilet and secretion management supportive care 2. Acute kidney injury followed by nephrology dialysis 3. Cardiac arrest grossly unchanged 4. Chronic congestive heart failure patient is at baseline we will continue with supportive care 5. Sleep apnea nonissue at this time   I  have personally seen and evaluated the patient, evaluated laboratory and imaging results, formulated the assessment and plan and placed orders. The Patient requires high complexity decision making for assessment and support.  Case was discussed on Rounds with the Respiratory Therapy Staff  Allyne Gee, MD [facility] Pulmonary Critical Care Medicine Sleep Medicine

## 2017-06-05 NOTE — Progress Notes (Signed)
[facility]  ROUNDING NOTE   Subjective:  Patient remains critically ill at the moment. He remains on the ventilator. Patient due for dialysis today. Not following commands.  Objective:  Vital signs in last 24 hours:  Temperature 97.9 pulse 74 respirations 16 blood pressure 116/64  Physical Exam: General:  Medically ill-appearing  Head:  Severe temporal wasting  Eyes: Anicteric  Neck:  Tracheostomy in place  Lungs:   Scattered rhonchi, vent assisted  Heart: S1S2 no rubs  Abdomen:  Soft, nontender, bowel sounds present  Extremities: No peripheral edema.  Neurologic:  Not following commands  Skin: No lesions  Access: Right subclavain temporary dialysis catheter    Basic Metabolic Panel: Recent Labs  Lab 06/09/2017 1852  06/01/17 0601 06/03/17 0700 06/03/17 1553 06/04/17 0513 06/05/17 0803  NA 136   < > 127* 117* 116* 124* 122*  K 3.7   < > 4.4 4.5 5.0 4.3 4.4  CL 95*   < > 86* 75* 74* 86* 84*  CO2 28   < > 25 22 22 24  21*  GLUCOSE 117*   < > 97 135* 125* 120* 108*  BUN 56*   < > 90* 146* 146* 121* 141*  CREATININE 4.48*   < > 5.91* 6.41* 6.28* 4.83* 4.46*  CALCIUM 9.2   < > 8.8* 8.7* 8.7* 8.7* 8.8*  MG 1.9  --  2.1  --   --  2.3  --   PHOS 4.4  --  6.7*  --  10.2* 7.5* 8.0*   < > = values in this interval not displayed.    Liver Function Tests: Recent Labs  Lab 06/09/2017 1852 06/01/17 0601 06/03/17 1553 06/04/17 0513 06/05/17 0803  AST 56* 49*  --  45*  --   ALT 28 18  --  14*  --   ALKPHOS 203* 229*  --  229*  --   BILITOT 1.1 1.5*  --  1.1  --   PROT 7.7 8.3*  --  7.9  --   ALBUMIN 2.7* 2.9* 2.7* 2.7* 2.7*   No results for input(s): LIPASE, AMYLASE in the last 168 hours. No results for input(s): AMMONIA in the last 168 hours.  CBC: Recent Labs  Lab 05/30/17 1104 06/01/17 0942 06/03/17 0700 06/03/17 1553 06/05/17 0943  WBC 7.2 7.0 7.9 6.9 6.7  NEUTROABS 4.5  --   --   --   --   HGB 9.2* 9.3* 9.4* 9.2* 7.5*  HCT 28.0* 27.5* 27.6*  27.1* 22.9*  MCV 80.9 80.2 79.3 78.3 80.1  PLT 213 256 299 262 211    Cardiac Enzymes: No results for input(s): CKTOTAL, CKMB, CKMBINDEX, TROPONINI in the last 168 hours.  BNP: Invalid input(s): POCBNP  CBG: No results for input(s): GLUCAP in the last 168 hours.  Microbiology: Results for orders placed or performed during the hospital encounter of 05/24/17  MRSA PCR Screening     Status: Abnormal   Collection Time: 05/24/17  6:01 AM  Result Value Ref Range Status   MRSA by PCR POSITIVE (A) NEGATIVE Final    Comment:        The GeneXpert MRSA Assay (FDA approved for NASAL specimens only), is one component of a comprehensive MRSA colonization surveillance program. It is not intended to diagnose MRSA infection nor to guide or monitor treatment for MRSA infections. RESULT CALLED TO, READ BACK BY AND VERIFIED WITHAlgis [facility] RN AT [phone] 05/24/17 BY A.DAVIS Performed at [facility], [address] [place], [place] [address]   Fungus culture, blood     Status: None   Collection Time: 05/24/17  6:15 AM  Result Value Ref Range Status   Specimen Description BLOOD RIGHT HAND  Final   Special Requests   Final    BOTTLES DRAWN AEROBIC ONLY Blood Culture results may not be optimal due to an inadequate volume of blood received in culture bottles   Culture   Final    NO GROWTH 7 DAYS NO FUNGUS ISOLATED Performed at [facility], [address], [place], [place] [address]    Report Status 05/31/2017 FINAL  Final  Culture, blood (routine x 2)     Status: None   Collection Time: 05/24/17  6:20 AM  Result Value Ref Range Status   Specimen Description BLOOD RIGHT HAND  Final   Special Requests   Final    BOTTLES DRAWN AEROBIC ONLY Blood Culture results may not be optimal due to an inadequate volume of blood received in culture bottles   Culture   Final    NO GROWTH 5 DAYS Performed at [facility], [address] [place], [place] [address]    Report Status  06/01/2017 FINAL  Final  Culture, blood (routine x 2)     Status: None   Collection Time: 05/24/17  6:41 AM  Result Value Ref Range Status   Specimen Description BLOOD LEFT ANTECUBITAL  Final   Special Requests   Final    BOTTLES DRAWN AEROBIC ONLY Blood Culture results may not be optimal due to an inadequate volume of blood received in culture bottles   Culture   Final    NO GROWTH 5 DAYS Performed at [facility], [address] [place], [place] [address]    Report Status 05/28/2017 FINAL  Final  Culture, respiratory (NON-Expectorated)     Status: None   Collection Time: 05/24/17  3:25 PM  Result Value Ref Range Status   Specimen Description TRACHEAL ASPIRATE  Final   Special Requests NONE  Final   Gram Stain   Final    ABUNDANT WBC PRESENT,BOTH PMN AND MONONUCLEAR RARE SQUAMOUS EPITHELIAL CELLS PRESENT RARE GRAM POSITIVE COCCI IN PAIRS IN CHAINS RARE GRAM NEGATIVE COCCOBACILLI    Culture   Final    FEW Consistent with normal respiratory flora. Performed at [facility], [address] [place], [place] [address]    Report Status 05/27/2017 FINAL  Final    Coagulation Studies: No results for input(s): LABPROT, INR in the last 72 hours.  Urinalysis: No results for input(s): COLORURINE, LABSPEC, PHURINE, GLUCOSEU, HGBUR, BILIRUBINUR, KETONESUR, PROTEINUR, UROBILINOGEN, NITRITE, LEUKOCYTESUR in the last 72 hours.  Invalid input(s): APPERANCEUR    Imaging: No results found.   Medications:     lidocaine (PF)  Assessment/ Plan:  62 y.o. 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   1.  Severe acute renal failure. 2.  Hyponatremia. 3.  Acute respiratory failure. 4.  Anemia unspecified. 5.  Severe protein calorie malnutrition with severe emaciation.  Plan: The patient remains critically ill.  He remains on the ventilator and has acute  renal failure.  We will proceed with second dialysis treatment today.  Third dialysis treatment will be scheduled for Monday.  Overall however patient continues to have an extremely guarded prognosis.  Pulmonary/critical care also on the case.    LOS: 0 Maloni Musleh 4/19/20193:30 PM

## 2017-06-06 NOTE — Progress Notes (Signed)
Pulmonary Critical Care Medicine [facility] [place]   PULMONARY SERVICE  PROGRESS NOTE  Date of Service: 06/06/2017  Melvin Kelley  VZD:[id number]  DOB: [birth year]   DOA: 05/21/2017  Referring Physician: Carron Curie, MD  HPI: Melvin Kelley is a 62 y.o. male seen for follow up of Acute on Chronic Respiratory Failure.  Is reportedly doing better more responsive.  The nursing staff says he is been more interactive with them  Medications: Reviewed on Rounds  Physical Exam:  Vitals: Temperature 97.4 pulse 85 respiratory rate 18 blood pressure 103/52 saturations 99%  Ventilator Settings on T collar 28% oxygen  . General: Comfortable at this time . Eyes: Grossly normal lids, irises & conjunctiva . ENT: grossly tongue is normal . Neck: no obvious mass . Cardiovascular: S1-S2 normal no gallop . Respiratory: No rhonchi expansion equal . Abdomen: Soft and nontender . Skin: no rash seen on limited exam . Musculoskeletal: not rigid . Psychiatric:unable to assess . Neurologic: no seizure no involuntary movements         Labs on Admission:  Basic Metabolic Panel: Recent Labs  Lab 06/01/17 0601 06/03/17 0700 06/03/17 1553 06/04/17 0513 06/05/17 0803  NA 127* 117* 116* 124* 122*  K 4.4 4.5 5.0 4.3 4.4  CL 86* 75* 74* 86* 84*  CO2 25 22 22 24  21*  GLUCOSE 97 135* 125* 120* 108*  BUN 90* 146* 146* 121* 141*  CREATININE 5.91* 6.41* 6.28* 4.83* 4.46*  CALCIUM 8.8* 8.7* 8.7* 8.7* 8.8*  MG 2.1  --   --  2.3  --   PHOS 6.7*  --  10.2* 7.5* 8.0*    Liver Function Tests: Recent Labs  Lab 06/01/17 0601 06/03/17 1553 06/04/17 0513 06/05/17 0803  AST 49*  --  45*  --   ALT 18  --  14*  --   ALKPHOS 229*  --  229*  --   BILITOT 1.5*  --  1.1  --   PROT 8.3*  --  7.9  --   ALBUMIN 2.9* 2.7* 2.7* 2.7*   No results for input(s): LIPASE, AMYLASE in the last 168 hours. No results for input(s): AMMONIA in the last 168 hours.  CBC: Recent Labs  Lab  06/01/17 0942 06/03/17 0700 06/03/17 1553 06/05/17 0943  WBC 7.0 7.9 6.9 6.7  HGB 9.3* 9.4* 9.2* 7.5*  HCT 27.5* 27.6* 27.1* 22.9*  MCV 80.2 79.3 78.3 80.1  PLT 256 299 262 211    Cardiac Enzymes: No results for input(s): CKTOTAL, CKMB, CKMBINDEX, TROPONINI in the last 168 hours.  BNP (last 3 results) No results for input(s): BNP in the last 8760 hours.  ProBNP (last 3 results) No results for input(s): PROBNP in the last 8760 hours.  Radiological Exams on Admission: No results found.  Assessment/Plan Active Problems:   Cardiac arrest (HCC)   Acute on chronic respiratory failure (HCC)   DNR (do not resuscitate) discussion   AKI (acute kidney injury) (HCC)   CHF (congestive heart failure) (HCC)   Obstructive sleep apnea   Acute kidney injury (HCC)   1. Acute on chronic respiratory failure with hypoxia we will continue to advance to wean 3-hour aerosolized T collar completed 2. Acute kidney injury following with nephrology dialysis as ordered 3. Congestive heart failure clinically improved 4. Sleep apnea will monitor 5. Status post cardiac arrest stable actually improving neurologically   I have personally seen and evaluated the patient, evaluated laboratory and imaging results, formulated the assessment and plan and placed  orders. The Patient requires high complexity decision making for assessment and support.  Case was discussed on Rounds with the Respiratory Therapy Staff  Allyne Gee, MD [facility] Pulmonary Critical Care Medicine Sleep Medicine

## 2017-06-07 LAB — COMPREHENSIVE METABOLIC PANEL
ALK PHOS: 201 U/L — AB (ref 38–126)
ALT: 15 U/L — ABNORMAL LOW (ref 17–63)
ANION GAP: 11 (ref 5–15)
AST: 38 U/L (ref 15–41)
Albumin: 2.7 g/dL — ABNORMAL LOW (ref 3.5–5.0)
BILIRUBIN TOTAL: 1.1 mg/dL (ref 0.3–1.2)
BUN: 90 mg/dL — ABNORMAL HIGH (ref 6–20)
CALCIUM: 9.1 mg/dL (ref 8.9–10.3)
CO2: 27 mmol/L (ref 22–32)
Chloride: 90 mmol/L — ABNORMAL LOW (ref 101–111)
Creatinine, Ser: 2.86 mg/dL — ABNORMAL HIGH (ref 0.61–1.24)
GFR, EST AFRICAN AMERICAN: 26 mL/min — AB (ref >60)
GFR, EST NON AFRICAN AMERICAN: 22 mL/min — AB (ref >60)
Glucose, Bld: 106 mg/dL — ABNORMAL HIGH (ref 65–99)
POTASSIUM: 4.6 mmol/L (ref 3.5–5.1)
Sodium: 128 mmol/L — ABNORMAL LOW (ref 135–145)
TOTAL PROTEIN: 8 g/dL (ref 6.5–8.1)

## 2017-06-07 LAB — MAGNESIUM: Magnesium: 2.2 mg/dL (ref 1.7–2.4)

## 2017-06-07 LAB — PHOSPHORUS: PHOSPHORUS: 6.5 mg/dL — AB (ref 2.5–4.6)

## 2017-06-07 NOTE — Progress Notes (Signed)
Pulmonary Critical Care Medicine [facility] [place]   PULMONARY SERVICE  PROGRESS NOTE  Date of Service: 06/07/2017  Melvin Kelley  [id number]:[id number]  DOB: Jun 20, 1955   DOA: 06/11/2017  Referring Physician: Carron Curie, MD  HPI: Melvin Kelley is a 62 y.o. male seen for follow up of Acute on Chronic Respiratory Failure.  Patient is doing well has been on T collar without distress.  Currently is on 28% oxygen  Medications: Reviewed on Rounds  Physical Exam:  Vitals: Temperature 97.4 pulse 87 respiratory rate 20 blood pressure 102/44 saturations 100%  Ventilator Settings T collar 28% FiO2  . General: Comfortable at this time . Eyes: Grossly normal lids, irises & conjunctiva . ENT: grossly tongue is normal . Neck: no obvious mass . Cardiovascular: S1-S2 normal no gallop or rub . Respiratory: No rhonchi expansion is equal . Abdomen: Soft and nondistended . Skin: no rash seen on limited exam . Musculoskeletal: not rigid . Psychiatric:unable to assess . Neurologic: no seizure no involuntary movements         Labs on Admission:  Basic Metabolic Panel: Recent Labs  Lab 06/01/17 0601 06/03/17 0700 06/03/17 1553 06/04/17 0513 06/05/17 0803 06/07/17 0847  NA 127* 117* 116* 124* 122* 128*  K 4.4 4.5 5.0 4.3 4.4 4.6  CL 86* 75* 74* 86* 84* 90*  CO2 25 22 22 24  21* 27  GLUCOSE 97 135* 125* 120* 108* 106*  BUN 90* 146* 146* 121* 141* 90*  CREATININE 5.91* 6.41* 6.28* 4.83* 4.46* 2.86*  CALCIUM 8.8* 8.7* 8.7* 8.7* 8.8* 9.1  MG 2.1  --   --  2.3  --  2.2  PHOS 6.7*  --  10.2* 7.5* 8.0* 6.5*    Liver Function Tests: Recent Labs  Lab 06/01/17 0601 06/03/17 1553 06/04/17 0513 06/05/17 0803 06/07/17 0847  AST 49*  --  45*  --  38  ALT 18  --  14*  --  15*  ALKPHOS 229*  --  229*  --  201*  BILITOT 1.5*  --  1.1  --  1.1  PROT 8.3*  --  7.9  --  8.0  ALBUMIN 2.9* 2.7* 2.7* 2.7* 2.7*   No results for input(s): LIPASE, AMYLASE in the last 168 hours. No  results for input(s): AMMONIA in the last 168 hours.  CBC: Recent Labs  Lab 06/01/17 0942 06/03/17 0700 06/03/17 1553 06/05/17 0943  WBC 7.0 7.9 6.9 6.7  HGB 9.3* 9.4* 9.2* 7.5*  HCT 27.5* 27.6* 27.1* 22.9*  MCV 80.2 79.3 78.3 80.1  PLT 256 299 262 211    Cardiac Enzymes: No results for input(s): CKTOTAL, CKMB, CKMBINDEX, TROPONINI in the last 168 hours.  BNP (last 3 results) No results for input(s): BNP in the last 8760 hours.  ProBNP (last 3 results) No results for input(s): PROBNP in the last 8760 hours.  Radiological Exams on Admission: No results found.  Assessment/Plan Active Problems:   Cardiac arrest (HCC)   Acute on chronic respiratory failure (HCC)   DNR (do not resuscitate) discussion   AKI (acute kidney injury) (HCC)   CHF (congestive heart failure) (HCC)   Obstructive sleep apnea   Acute kidney injury (HCC)   1. Acute on chronic respiratory failure with hypoxia continues to do well with weaning we will advance to T collar as tolerated now continue with pulmonary toilet secretion management supportive care 2. Acute kidney injury followed by nephrology will continue with dialysis as deemed necessary 3. Chronic congestive heart failure patient  is doing better stable follow-up fluids 4. Sleep apnea not an issue at this time 5. Status post cardiac arrest at baseline encephalopathy does appear to be clearing a little bit   I have personally seen and evaluated the patient, evaluated laboratory and imaging results, formulated the assessment and plan and placed orders. The Patient requires high complexity decision making for assessment and support.  Case was discussed on Rounds with the Respiratory Therapy Staff  Yevonne Pax, MD [facility] Pulmonary Critical Care Medicine Sleep Medicine

## 2017-06-08 LAB — CBC
HCT: 24.9 % — ABNORMAL LOW (ref 39.0–52.0)
Hemoglobin: 8.4 g/dL — ABNORMAL LOW (ref 13.0–17.0)
MCH: 27.5 pg (ref 26.0–34.0)
MCHC: 33.7 g/dL (ref 30.0–36.0)
MCV: 81.6 fL (ref 78.0–100.0)
Platelets: 200 10*3/uL (ref 150–400)
RBC: 3.05 MIL/uL — ABNORMAL LOW (ref 4.22–5.81)
RDW: 17.8 % — ABNORMAL HIGH (ref 11.5–15.5)
WBC: 7.1 10*3/uL (ref 4.0–10.5)

## 2017-06-08 LAB — RENAL FUNCTION PANEL
Albumin: 2.9 g/dL — ABNORMAL LOW (ref 3.5–5.0)
Anion gap: 15 (ref 5–15)
BUN: 99 mg/dL — AB (ref 6–20)
CALCIUM: 9.1 mg/dL (ref 8.9–10.3)
CO2: 24 mmol/L (ref 22–32)
CREATININE: 2.81 mg/dL — AB (ref 0.61–1.24)
Chloride: 87 mmol/L — ABNORMAL LOW (ref 101–111)
GFR calc non Af Amer: 23 mL/min — ABNORMAL LOW (ref >60)
GFR, EST AFRICAN AMERICAN: 27 mL/min — AB (ref >60)
GLUCOSE: 98 mg/dL (ref 65–99)
Phosphorus: 7.2 mg/dL — ABNORMAL HIGH (ref 2.5–4.6)
Potassium: 4.6 mmol/L (ref 3.5–5.1)
SODIUM: 126 mmol/L — AB (ref 135–145)

## 2017-06-08 NOTE — Progress Notes (Signed)
[facility]  ROUNDING NOTE   Subjective:  Due for hemodialysis later today. Remains critically ill. Currently off of the ventilator however.  Objective:  Vital signs in last 24 hours:  Temperature 98.7 pulse 84 respirations 22 blood pressure 134/59  Physical Exam: General: Critically ill-appearing  Head: Severe temporal wasting  Eyes: Anicteric  Neck: Tracheostomy in place  Lungs:  Scattered rhonchi, vent assisted  Heart: S1S2 no rubs  Abdomen:  Soft, nontender, bowel sounds present  Extremities: No peripheral edema.  Neurologic: Not following commands  Skin: No lesions  Access: Right subclavain temporary dialysis catheter    Basic Metabolic Panel: Recent Labs  Lab 06/03/17 1553 06/04/17 0513 06/05/17 0803 06/07/17 0847 06/08/17 0802  NA 116* 124* 122* 128* 126*  K 5.0 4.3 4.4 4.6 4.6  CL 74* 86* 84* 90* 87*  CO2 22 24 21* 27 24  GLUCOSE 125* 120* 108* 106* 98  BUN 146* 121* 141* 90* 99*  CREATININE 6.28* 4.83* 4.46* 2.86* 2.81*  CALCIUM 8.7* 8.7* 8.8* 9.1 9.1  MG  --  2.3  --  2.2  --   PHOS 10.2* 7.5* 8.0* 6.5* 7.2*    Liver Function Tests: Recent Labs  Lab 06/03/17 1553 06/04/17 0513 06/05/17 0803 06/07/17 0847 06/08/17 0802  AST  --  45*  --  38  --   ALT  --  14*  --  15*  --   ALKPHOS  --  229*  --  201*  --   BILITOT  --  1.1  --  1.1  --   PROT  --  7.9  --  8.0  --   ALBUMIN 2.7* 2.7* 2.7* 2.7* 2.9*   No results for input(s): LIPASE, AMYLASE in the last 168 hours. No results for input(s): AMMONIA in the last 168 hours.  CBC: Recent Labs  Lab 06/03/17 0700 06/03/17 1553 06/05/17 0943 06/08/17 0802  WBC 7.9 6.9 6.7 7.1  HGB 9.4* 9.2* 7.5* 8.4*  HCT 27.6* 27.1* 22.9* 24.9*  MCV 79.3 78.3 80.1 81.6  PLT 299 262 211 200    Cardiac Enzymes: No results for input(s): CKTOTAL, CKMB, CKMBINDEX, TROPONINI in the last 168 hours.  BNP: Invalid input(s): POCBNP  CBG: No results for input(s): GLUCAP in the last 168  hours.  Microbiology: Results for orders placed or performed during the hospital encounter of 05/24/17  MRSA PCR Screening     Status: Abnormal   Collection Time: 05/24/17  6:01 AM  Result Value Ref Range Status   MRSA by PCR POSITIVE (A) NEGATIVE Final    Comment:        The GeneXpert MRSA Assay (FDA approved for NASAL specimens only), is one component of a comprehensive MRSA colonization surveillance program. It is not intended to diagnose MRSA infection nor to guide or monitor treatment for MRSA infections. RESULT CALLED TO, READ BACK BY AND VERIFIED WITHAlgis Downs [facility] RN AT [phone] 05/24/17 BY A.DAVIS Performed at [facility], [address], [place], [place] [address]   Fungus culture, blood     Status: None   Collection Time: 05/24/17  6:15 AM  Result Value Ref Range Status   Specimen Description BLOOD RIGHT HAND  Final   Special Requests   Final    BOTTLES DRAWN AEROBIC ONLY Blood Culture results may not be optimal due to an inadequate volume of blood received in culture bottles   Culture   Final    NO GROWTH 7 DAYS NO FUNGUS  ISOLATED Performed at [facility], [address], [place], [place] [address]    Report Status 05/31/2017 FINAL  Final  Culture, blood (routine x 2)     Status: None   Collection Time: 05/24/17  6:20 AM  Result Value Ref Range Status   Specimen Description BLOOD RIGHT HAND  Final   Special Requests   Final    BOTTLES DRAWN AEROBIC ONLY Blood Culture results may not be optimal due to an inadequate volume of blood received in culture bottles   Culture   Final    NO GROWTH 5 DAYS Performed at [facility], [address], [place], [place] [address]    Report Status 05/30/2017 FINAL  Final  Culture, blood (routine x 2)     Status: None   Collection Time: 05/24/17  6:41 AM  Result Value Ref Range Status   Specimen Description BLOOD LEFT ANTECUBITAL  Final   Special Requests   Final    BOTTLES DRAWN AEROBIC ONLY Blood Culture results  may not be optimal due to an inadequate volume of blood received in culture bottles   Culture   Final    NO GROWTH 5 DAYS Performed at [facility], [address] [place], [place] [address]    Report Status 05/25/2017 FINAL  Final  Culture, respiratory (NON-Expectorated)     Status: None   Collection Time: 05/24/17  3:25 PM  Result Value Ref Range Status   Specimen Description TRACHEAL ASPIRATE  Final   Special Requests NONE  Final   Gram Stain   Final    ABUNDANT WBC PRESENT,BOTH PMN AND MONONUCLEAR RARE SQUAMOUS EPITHELIAL CELLS PRESENT RARE GRAM POSITIVE COCCI IN PAIRS IN CHAINS RARE GRAM NEGATIVE COCCOBACILLI    Culture   Final    FEW Consistent with normal respiratory flora. Performed at [facility], [address] [place] [address], [place], [place] [address]    Report Status 05/27/2017 FINAL  Final    Coagulation Studies: No results for input(s): LABPROT, INR in the last 72 hours.  Urinalysis: No results for input(s): COLORURINE, LABSPEC, PHURINE, GLUCOSEU, HGBUR, BILIRUBINUR, KETONESUR, PROTEINUR, UROBILINOGEN, NITRITE, LEUKOCYTESUR in the last 72 hours.  Invalid input(s): APPERANCEUR    Imaging: No results found.   Medications:     lidocaine (PF)  Assessment/ Plan:  62 y.o. 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   1.  Severe acute renal failure. 2.  Hyponatremia. 3.  Acute respiratory failure. 4.  Anemia unspecified. 5.  Severe protein calorie malnutrition with severe emaciation.  Plan: Patient due  for hemodialysis later today.  Continues to have significant azotemia at the moment.  Serum sodium remains low at 126 but should come up with additional dialysis.  He has currently been weaned off of the ventilator and is on a T-piece.  Continue to monitor renal parameters periodically.  We will plan for HD again on Wednesday thereafter.      LOS: 0 Myiesha Edgar 4/22/20192:55 PM

## 2017-06-08 NOTE — Progress Notes (Signed)
Pulmonary Critical Care Medicine [facility] [place]   PULMONARY SERVICE  PROGRESS NOTE  Date of Service: 06/08/2017  Melvin Kelley  [id number]  DOB: Dec 07, 1955   DOA: 05/18/2017  Referring Physician: Carron Curie, MD  HPI: Melvin Kelley is a 62 y.o. male seen for follow up of Acute on Chronic Respiratory Failure.  Continues to wean on T collar is been doing very well has been off the ventilator for more than 24 hr  Medications: Reviewed on Rounds  Physical Exam:  Vitals:  Temperature 96.9 degrees pulse 87 respiratory 18 blood pressure 96/56 saturations 100%  Ventilator Settings  Aerosolized T collar 28% FiO2  . General: Comfortable at this time . Eyes: Grossly normal lids, irises & conjunctiva . ENT: grossly tongue is normal . Neck: no obvious mass . Cardiovascular:  S1-S2 normal no gallop or rub . Respiratory:  No rhonchi expansion equal . Abdomen:  Soft nontender . Skin: no rash seen on limited exam . Musculoskeletal: not rigid . Psychiatric:unable to assess . Neurologic: no seizure no involuntary movements         Labs on Admission:  Basic Metabolic Panel: Recent Labs  Lab 06/03/17 1553 06/04/17 0513 06/05/17 0803 06/07/17 0847 06/08/17 0802  NA 116* 124* 122* 128* 126*  K 5.0 4.3 4.4 4.6 4.6  CL 74* 86* 84* 90* 87*  CO2 22 24 21* 27 24  GLUCOSE 125* 120* 108* 106* 98  BUN 146* 121* 141* 90* 99*  CREATININE 6.28* 4.83* 4.46* 2.86* 2.81*  CALCIUM 8.7* 8.7* 8.8* 9.1 9.1  MG  --  2.3  --  2.2  --   PHOS 10.2* 7.5* 8.0* 6.5* 7.2*    Liver Function Tests: Recent Labs  Lab 06/03/17 1553 06/04/17 0513 06/05/17 0803 06/07/17 0847 06/08/17 0802  AST  --  45*  --  38  --   ALT  --  14*  --  15*  --   ALKPHOS  --  229*  --  201*  --   BILITOT  --  1.1  --  1.1  --   PROT  --  7.9  --  8.0  --   ALBUMIN 2.7* 2.7* 2.7* 2.7* 2.9*   No results for input(s): LIPASE, AMYLASE in the last 168 hours. No results for input(s): AMMONIA in the  last 168 hours.  CBC: Recent Labs  Lab 06/03/17 0700 06/03/17 1553 06/05/17 0943 06/08/17 0802  WBC 7.9 6.9 6.7 7.1  HGB 9.4* 9.2* 7.5* 8.4*  HCT 27.6* 27.1* 22.9* 24.9*  MCV 79.3 78.3 80.1 81.6  PLT 299 262 211 200    Cardiac Enzymes: No results for input(s): CKTOTAL, CKMB, CKMBINDEX, TROPONINI in the last 168 hours.  BNP (last 3 results) No results for input(s): BNP in the last 8760 hours.  ProBNP (last 3 results) No results for input(s): PROBNP in the last 8760 hours.  Radiological Exams on Admission: No results found.  Assessment/Plan Active Problems:   Cardiac arrest (HCC)   Acute on chronic respiratory failure (HCC)   DNR (do not resuscitate) discussion   AKI (acute kidney injury) (HCC)   CHF (congestive heart failure) (HCC)   Obstructive sleep apnea   Acute kidney injury (HCC)   1.  acute on chronic Respiratory failure with hypoxia continues to wean right now on T collar and we will continue to advance it as tolerated continue with secretion management pulmonary toilet 2. Acute kidney injury followed by Nephrology continue with dialysis 3. Congestive heart failure monitor  fluid status closely 4. Status post cardiac arrest at baseline 5. Sleep apnea non issue at this time   I have personally seen and evaluated the patient, evaluated laboratory and imaging results, formulated the assessment and plan and placed orders. The Patient requires high complexity decision making for assessment and support.  Case was discussed on Rounds with the Respiratory Therapy Staff  Yevonne Pax, MD [facility] Pulmonary Critical Care Medicine Sleep Medicine

## 2017-06-09 ENCOUNTER — Other Ambulatory Visit (HOSPITAL_COMMUNITY): Payer: Self-pay

## 2017-06-09 NOTE — Progress Notes (Signed)
Pulmonary Critical Care Medicine [facility] [place]   PULMONARY SERVICE  PROGRESS NOTE  Date of Service: 06/09/2017  Melvin Kelley  [id number]  DOB: 07/16/1955   DOA: 05/20/2017  Referring Physician: Carron Curie, MD  HPI: Melvin Kelley is a 62 y.o. male seen for follow up of Acute on Chronic Respiratory Failure.  Doing very well with the Passy-Muir valve we will continue with the/T collar has been very comfortable.  Medications: Reviewed on Rounds  Physical Exam:  Vitals: Temperature 98.0 pulse 84 respiratory rate 12 blood pressure 133/77 saturations 99%  Ventilator Settings on T collar 28% FiO2  . General: Comfortable at this time . Eyes: Grossly normal lids, irises & conjunctiva . ENT: grossly tongue is normal . Neck: no obvious mass . Cardiovascular: S1-S2 normal no gallop or rub . Respiratory: Coarse breath sounds no rhonchi . Abdomen: Soft nontender . Skin: no rash seen on limited exam . Musculoskeletal: not rigid . Psychiatric:unable to assess . Neurologic: no seizure no involuntary movements         Labs on Admission:  Basic Metabolic Panel: Recent Labs  Lab 06/03/17 1553 06/04/17 0513 06/05/17 0803 06/07/17 0847 06/08/17 0802  NA 116* 124* 122* 128* 126*  K 5.0 4.3 4.4 4.6 4.6  CL 74* 86* 84* 90* 87*  CO2 22 24 21* 27 24  GLUCOSE 125* 120* 108* 106* 98  BUN 146* 121* 141* 90* 99*  CREATININE 6.28* 4.83* 4.46* 2.86* 2.81*  CALCIUM 8.7* 8.7* 8.8* 9.1 9.1  MG  --  2.3  --  2.2  --   PHOS 10.2* 7.5* 8.0* 6.5* 7.2*    Liver Function Tests: Recent Labs  Lab 06/03/17 1553 06/04/17 0513 06/05/17 0803 06/07/17 0847 06/08/17 0802  AST  --  45*  --  38  --   ALT  --  14*  --  15*  --   ALKPHOS  --  229*  --  201*  --   BILITOT  --  1.1  --  1.1  --   PROT  --  7.9  --  8.0  --   ALBUMIN 2.7* 2.7* 2.7* 2.7* 2.9*   No results for input(s): LIPASE, AMYLASE in the last 168 hours. No results for input(s): AMMONIA in the last 168  hours.  CBC: Recent Labs  Lab 06/03/17 0700 06/03/17 1553 06/05/17 0943 06/08/17 0802  WBC 7.9 6.9 6.7 7.1  HGB 9.4* 9.2* 7.5* 8.4*  HCT 27.6* 27.1* 22.9* 24.9*  MCV 79.3 78.3 80.1 81.6  PLT 299 262 211 200    Cardiac Enzymes: No results for input(s): CKTOTAL, CKMB, CKMBINDEX, TROPONINI in the last 168 hours.  BNP (last 3 results) No results for input(s): BNP in the last 8760 hours.  ProBNP (last 3 results) No results for input(s): PROBNP in the last 8760 hours.  Radiological Exams on Admission: Dg Abd Portable 1v  Result Date: 06/09/2017 CLINICAL DATA:  Suspected ileus EXAM: PORTABLE ABDOMEN - 1 VIEW COMPARISON:  Abdominal radiograph of June 01, 2017 FINDINGS: A large amount of gas persists within presumably large bowel. No definite free extraluminal gas is observed. The bowel present is featureless. The esophagogastric tube tip and proximal port project below the expected location of the GE junction. IMPRESSION: Marked distention of bowel presumably colon throughout the abdomen. No definite free extraluminal gas but certainly small amounts of gas could be obscured by the overlying distended bowel. Given the persistent dilation over the past 8 days, abdominal CT scanning would be  useful to more thoroughly evaluate the bowel. Electronically Signed   By: David  [place] M.D.   On: 06/09/2017 13:08    Assessment/Plan Active Problems:   Cardiac arrest (HCC)   Acute on chronic respiratory failure (HCC)   DNR (do not resuscitate) discussion   AKI (acute kidney injury) (HCC)   CHF (congestive heart failure) (HCC)   Obstructive sleep apnea   Acute kidney injury (HCC)   1. Acute on chronic respiratory failure with hypoxia shows ongoing improvement should be able to proceed to changing the trach to a size 4 trach and then try to start capping trials 2. Acute kidney injury stable patient is followed by nephrology for dialysis 3. Sleep apnea we will continue to monitor once patient is  at stage of possible decannulation 4. Congestive heart failure right now is stable doing well   I have personally seen and evaluated the patient, evaluated laboratory and imaging results, formulated the assessment and plan and placed orders. The Patient requires high complexity decision making for assessment and support.  Case was discussed on Rounds with the Respiratory Therapy Staff  Yevonne Pax, MD [facility] Pulmonary Critical Care Medicine Sleep Medicine

## 2017-06-10 LAB — COMPREHENSIVE METABOLIC PANEL
ALT: 18 U/L (ref 17–63)
AST: 39 U/L (ref 15–41)
Albumin: 2.8 g/dL — ABNORMAL LOW (ref 3.5–5.0)
Alkaline Phosphatase: 210 U/L — ABNORMAL HIGH (ref 38–126)
Anion gap: 13 (ref 5–15)
BUN: 64 mg/dL — ABNORMAL HIGH (ref 6–20)
CHLORIDE: 96 mmol/L — AB (ref 101–111)
CO2: 23 mmol/L (ref 22–32)
CREATININE: 1.89 mg/dL — AB (ref 0.61–1.24)
Calcium: 9.3 mg/dL (ref 8.9–10.3)
GFR, EST AFRICAN AMERICAN: 43 mL/min — AB (ref >60)
GFR, EST NON AFRICAN AMERICAN: 37 mL/min — AB (ref >60)
Glucose, Bld: 102 mg/dL — ABNORMAL HIGH (ref 65–99)
Potassium: 4.6 mmol/L (ref 3.5–5.1)
SODIUM: 132 mmol/L — AB (ref 135–145)
Total Bilirubin: 1 mg/dL (ref 0.3–1.2)
Total Protein: 8.1 g/dL (ref 6.5–8.1)

## 2017-06-10 LAB — CBC
HEMATOCRIT: 26.8 % — AB (ref 39.0–52.0)
HEMOGLOBIN: 8.5 g/dL — AB (ref 13.0–17.0)
MCH: 26.5 pg (ref 26.0–34.0)
MCHC: 31.7 g/dL (ref 30.0–36.0)
MCV: 83.5 fL (ref 78.0–100.0)
Platelets: 155 10*3/uL (ref 150–400)
RBC: 3.21 MIL/uL — ABNORMAL LOW (ref 4.22–5.81)
RDW: 17.6 % — AB (ref 11.5–15.5)
WBC: 7.8 10*3/uL (ref 4.0–10.5)

## 2017-06-10 LAB — MAGNESIUM: MAGNESIUM: 2 mg/dL (ref 1.7–2.4)

## 2017-06-10 LAB — PHOSPHORUS: PHOSPHORUS: 4.9 mg/dL — AB (ref 2.5–4.6)

## 2017-06-10 NOTE — Progress Notes (Signed)
Pulmonary Critical Care Medicine [facility] [place]   PULMONARY SERVICE  PROGRESS NOTE  Date of Service: 06/10/2017  Melvin Kelley  [id number]  DOB: 14-Aug-1955   DOA: 06/11/2017  Referring Physician: Carron Curie, MD  HPI: Melvin Kelley is a 62 y.o. male seen for follow up of Acute on Chronic Respiratory Failure.  Patient is weaning doing very well on T collar today hopefully we should be able to advance to capping  Medications: Reviewed on Rounds  Physical Exam:  Vitals: Temperature 98.1 pulse 100 respiratory rate 29 blood pressure 127/63 saturations 98%  Ventilator Settings aerosolized T collar FiO2 28%  . General: Comfortable at this time . Eyes: Grossly normal lids, irises & conjunctiva . ENT: grossly tongue is normal . Neck: no obvious mass . Cardiovascular: S1-S2 normal no gallop or rub . Respiratory: No rhonchi . Abdomen: Soft nondistended . Skin: no rash seen on limited exam . Musculoskeletal: not rigid . Psychiatric:unable to assess . Neurologic: no seizure no involuntary movements         Labs on Admission:  Basic Metabolic Panel: Recent Labs  Lab 06/04/17 0513 06/05/17 0803 06/07/17 0847 06/08/17 0802 06/10/17 0537  NA 124* 122* 128* 126* 132*  K 4.3 4.4 4.6 4.6 4.6  CL 86* 84* 90* 87* 96*  CO2 24 21* 27 24 23   GLUCOSE 120* 108* 106* 98 102*  BUN 121* 141* 90* 99* 64*  CREATININE 4.83* 4.46* 2.86* 2.81* 1.89*  CALCIUM 8.7* 8.8* 9.1 9.1 9.3  MG 2.3  --  2.2  --  2.0  PHOS 7.5* 8.0* 6.5* 7.2* 4.9*    Liver Function Tests: Recent Labs  Lab 06/04/17 0513 06/05/17 0803 06/07/17 0847 06/08/17 0802 06/10/17 0537  AST 45*  --  38  --  39  ALT 14*  --  15*  --  18  ALKPHOS 229*  --  201*  --  210*  BILITOT 1.1  --  1.1  --  1.0  PROT 7.9  --  8.0  --  8.1  ALBUMIN 2.7* 2.7* 2.7* 2.9* 2.8*   No results for input(s): LIPASE, AMYLASE in the last 168 hours. No results for input(s): AMMONIA in the last 168 hours.  CBC: Recent  Labs  Lab 06/03/17 1553 06/05/17 0943 06/08/17 0802 06/10/17 0537  WBC 6.9 6.7 7.1 7.8  HGB 9.2* 7.5* 8.4* 8.5*  HCT 27.1* 22.9* 24.9* 26.8*  MCV 78.3 80.1 81.6 83.5  PLT 262 211 200 155    Cardiac Enzymes: No results for input(s): CKTOTAL, CKMB, CKMBINDEX, TROPONINI in the last 168 hours.  BNP (last 3 results) No results for input(s): BNP in the last 8760 hours.  ProBNP (last 3 results) No results for input(s): PROBNP in the last 8760 hours.  Radiological Exams on Admission: Dg Abd Portable 1v  Result Date: 06/09/2017 CLINICAL DATA:  Suspected ileus EXAM: PORTABLE ABDOMEN - 1 VIEW COMPARISON:  Abdominal radiograph of June 01, 2017 FINDINGS: A large amount of gas persists within presumably large bowel. No definite free extraluminal gas is observed. The bowel present is featureless. The esophagogastric tube tip and proximal port project below the expected location of the GE junction. IMPRESSION: Marked distention of bowel presumably colon throughout the abdomen. No definite free extraluminal gas but certainly small amounts of gas could be obscured by the overlying distended bowel. Given the persistent dilation over the past 8 days, abdominal CT scanning would be useful to more thoroughly evaluate the bowel. Electronically Signed   By: Onalee Hua  [place] M.D.   On: 06/09/2017 13:08    Assessment/Plan Active Problems:   Cardiac arrest (HCC)   Acute on chronic respiratory failure (HCC)   DNR (do not resuscitate) discussion   AKI (acute kidney injury) (HCC)   CHF (congestive heart failure) (HCC)   Obstructive sleep apnea   Acute kidney injury (HCC)   1. Acute on chronic respiratory failure with hypoxia doing very well we will continue to advance to wean I would like to start capping as noted above. 2. 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 Congestive heart failure follow fluid status closely along with the dialysis 4. Sleep apnea  nonissue at this time will need to be monitored once 5. Status post cardiac arrest stable   I have personally seen and evaluated the patient, evaluated laboratory and imaging results, formulated the assessment and plan and placed orders. The Patient requires high complexity decision making for assessment and support.  Case was discussed on Rounds with the Respiratory Therapy Staff  Yevonne Pax, MD [facility] Pulmonary Critical Care Medicine Sleep Medicine

## 2017-06-10 NOTE — Progress Notes (Signed)
[facility]  ROUNDING NOTE   Subjective:  Pt much more interactive today. Able to verbalize.  Due for HD today.  Making more urine, was 900cc over the preceding 24 hours.   Objective:  Vital signs in last 24 hours:  Temperature 98.1 pulse 100 respirations 29 blood pressure 122/63  Physical Exam: General: Chronically ill-appearing  Head: Severe temporal wasting  Eyes: Anicteric  Neck: Tracheostomy in place  Lungs:  Scattered rhonchi, vent assisted  Heart: S1S2 no rubs  Abdomen:  Soft, nontender, bowel sounds present  Extremities: No peripheral edema.  Neurologic: Not following commands  Skin: No lesions  Access: Right subclavain temporary dialysis catheter    Basic Metabolic Panel: Recent Labs  Lab 06/03/17 1553 06/04/17 0513 06/05/17 0803 06/07/17 0847 06/08/17 0802  NA 116* 124* 122* 128* 126*  K 5.0 4.3 4.4 4.6 4.6  CL 74* 86* 84* 90* 87*  CO2 22 24 21* 27 24  GLUCOSE 125* 120* 108* 106* 98  BUN 146* 121* 141* 90* 99*  CREATININE 6.28* 4.83* 4.46* 2.86* 2.81*  CALCIUM 8.7* 8.7* 8.8* 9.1 9.1  MG  --  2.3  --  2.2  --   PHOS 10.2* 7.5* 8.0* 6.5* 7.2*    Liver Function Tests: Recent Labs  Lab 06/03/17 1553 06/04/17 0513 06/05/17 0803 06/07/17 0847 06/08/17 0802  AST  --  45*  --  38  --   ALT  --  14*  --  15*  --   ALKPHOS  --  229*  --  201*  --   BILITOT  --  1.1  --  1.1  --   PROT  --  7.9  --  8.0  --   ALBUMIN 2.7* 2.7* 2.7* 2.7* 2.9*   No results for input(s): LIPASE, AMYLASE in the last 168 hours. No results for input(s): AMMONIA in the last 168 hours.  CBC: Recent Labs  Lab 06/03/17 1553 06/05/17 0943 06/08/17 0802 06/10/17 0537  WBC 6.9 6.7 7.1 7.8  HGB 9.2* 7.5* 8.4* 8.5*  HCT 27.1* 22.9* 24.9* 26.8*  MCV 78.3 80.1 81.6 83.5  PLT 262 211 200 155    Cardiac Enzymes: No results for input(s): CKTOTAL, CKMB, CKMBINDEX, TROPONINI in the last 168 hours.  BNP: Invalid input(s): POCBNP  CBG: No results for  input(s): GLUCAP in the last 168 hours.  Microbiology: Results for orders placed or performed during the hospital encounter of 05/24/17  MRSA PCR Screening     Status: Abnormal   Collection Time: 05/24/17  6:01 AM  Result Value Ref Range Status   MRSA by PCR POSITIVE (A) NEGATIVE Final    Comment:        The GeneXpert MRSA Assay (FDA approved for NASAL specimens only), is one component of a comprehensive MRSA colonization surveillance program. It is not intended to diagnose MRSA infection nor to guide or monitor treatment for MRSA infections. RESULT CALLED TO, READ BACK BY AND VERIFIED WITHAlgis Downs [facility] RN AT [phone] 05/24/17 BY A.DAVIS Performed at [facility], [address], [place], [place] [address]   Fungus culture, blood     Status: None   Collection Time: 05/24/17  6:15 AM  Result Value Ref Range Status   Specimen Description BLOOD RIGHT HAND  Final   Special Requests   Final    BOTTLES DRAWN AEROBIC ONLY Blood Culture results may not be optimal due to an inadequate volume of blood received in culture bottles   Culture  Final    NO GROWTH 7 DAYS NO FUNGUS ISOLATED Performed at [facility], [address] [place], [place] [address]    Report Status 05/31/2017 FINAL  Final  Culture, blood (routine x 2)     Status: None   Collection Time: 05/24/17  6:20 AM  Result Value Ref Range Status   Specimen Description BLOOD RIGHT HAND  Final   Special Requests   Final    BOTTLES DRAWN AEROBIC ONLY Blood Culture results may not be optimal due to an inadequate volume of blood received in culture bottles   Culture   Final    NO GROWTH 5 DAYS Performed at [facility], [address] [place], [place] [address]    Report Status 06/01/2017 FINAL  Final  Culture, blood (routine x 2)     Status: None   Collection Time: 05/24/17  6:41 AM  Result Value Ref Range Status   Specimen Description BLOOD LEFT ANTECUBITAL  Final   Special Requests   Final    BOTTLES DRAWN  AEROBIC ONLY Blood Culture results may not be optimal due to an inadequate volume of blood received in culture bottles   Culture   Final    NO GROWTH 5 DAYS Performed at [facility], [address], [place], [place] [address]    Report Status 06/06/2017 FINAL  Final  Culture, respiratory (NON-Expectorated)     Status: None   Collection Time: 05/24/17  3:25 PM  Result Value Ref Range Status   Specimen Description TRACHEAL ASPIRATE  Final   Special Requests NONE  Final   Gram Stain   Final    ABUNDANT WBC PRESENT,BOTH PMN AND MONONUCLEAR RARE SQUAMOUS EPITHELIAL CELLS PRESENT RARE GRAM POSITIVE COCCI IN PAIRS IN CHAINS RARE GRAM NEGATIVE COCCOBACILLI    Culture   Final    FEW Consistent with normal respiratory flora. Performed at [facility], [address] [place], [place] [address]    Report Status 05/27/2017 FINAL  Final    Coagulation Studies: No results for input(s): LABPROT, INR in the last 72 hours.  Urinalysis: No results for input(s): COLORURINE, LABSPEC, PHURINE, GLUCOSEU, HGBUR, BILIRUBINUR, KETONESUR, PROTEINUR, UROBILINOGEN, NITRITE, LEUKOCYTESUR in the last 72 hours.  Invalid input(s): APPERANCEUR    Imaging: Dg Abd Portable 1v  Result Date: 06/09/2017 CLINICAL DATA:  Suspected ileus EXAM: PORTABLE ABDOMEN - 1 VIEW COMPARISON:  Abdominal radiograph of June 01, 2017 FINDINGS: A large amount of gas persists within presumably large bowel. No definite free extraluminal gas is observed. The bowel present is featureless. The esophagogastric tube tip and proximal port project below the expected location of the GE junction. IMPRESSION: Marked distention of bowel presumably colon throughout the abdomen. No definite free extraluminal gas but certainly small amounts of gas could be obscured by the overlying distended bowel. Given the persistent dilation over the past 8 days, abdominal CT scanning would be useful to more thoroughly evaluate the bowel.  Electronically Signed   By: David  [place] M.D.   On: 06/09/2017 13:08     Medications:     lidocaine (PF)  Assessment/ Plan:  62 y.o. 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   1.  Severe acute renal failure. 2.  Hyponatremia. 3.  Acute respiratory failure. 4.  Anemia unspecified. 5.  Severe protein calorie malnutrition with severe emaciation.  Plan: Patient will be due for hemodialysis today.  He is much more awake and alert as compared to the past.  He will follow simple commands at the moment.  Urine output has increased to 900 cc over the preceding 24 hours.  Tentatively we will plan for dialysis on Friday as well but we will limit ultrafiltration.  Further plan as patient progresses.   LOS: 0 Melvin Kelley 4/24/20197:03 AM

## 2017-06-11 MED ORDER — IOPAMIDOL (ISOVUE-300) INJECTION 61%
INTRAVENOUS | Status: AC
  Filled 2017-06-11: qty 30

## 2017-06-11 MED ORDER — IOPAMIDOL (ISOVUE-300) INJECTION 61%
INTRAVENOUS | Status: AC
  Filled 2017-06-11: qty 100

## 2017-06-11 NOTE — Progress Notes (Signed)
Pulmonary Critical Care Medicine [facility]   PULMONARY SERVICE  PROGRESS NOTE  Date of Service: 06/11/2017  Melvin Kelley  TKW:[id number]  DOB: 02/09/1956   DOA: 05/23/2017  Referring Physician: Carron Curie, MD  HPI: Melvin Kelley is a 62 y.o. male seen for follow up of Acute on Chronic Respiratory Failure.  Patient remains on T collar has been doing very well.  We should be able to advance weaning to capping trials at this point  Medications: Reviewed on Rounds  Physical Exam:  Vitals:  Temperature 99.4 degrees pulse 102 respiratory 28 blood pressure 127/56 saturations 97%  Ventilator Settings  Aerosolized T collar FiO2 28%  . General: Comfortable at this time . Eyes: Grossly normal lids, irises & conjunctiva . ENT: grossly tongue is normal . Neck: no obvious mass . Cardiovascular:  S1-S2 normal no gallop or rub . Respiratory:  No rhonchi expansion equal . Abdomen:  Soft nondistended . Skin: no rash seen on limited exam . Musculoskeletal: not rigid . Psychiatric:unable to assess . Neurologic: no seizure no involuntary movements         Labs on Admission:  Basic Metabolic Panel: Recent Labs  Lab 06/05/17 0803 06/07/17 0847 06/08/17 0802 06/10/17 0537  NA 122* 128* 126* 132*  K 4.4 4.6 4.6 4.6  CL 84* 90* 87* 96*  CO2 21* 27 24 23   GLUCOSE 108* 106* 98 102*  BUN 141* 90* 99* 64*  CREATININE 4.46* 2.86* 2.81* 1.89*  CALCIUM 8.8* 9.1 9.1 9.3  MG  --  2.2  --  2.0  PHOS 8.0* 6.5* 7.2* 4.9*    Liver Function Tests: Recent Labs  Lab 06/05/17 0803 06/07/17 0847 06/08/17 0802 06/10/17 0537  AST  --  38  --  39  ALT  --  15*  --  18  ALKPHOS  --  201*  --  210*  BILITOT  --  1.1  --  1.0  PROT  --  8.0  --  8.1  ALBUMIN 2.7* 2.7* 2.9* 2.8*   No results for input(s): LIPASE, AMYLASE in the last 168 hours. No results for input(s): AMMONIA in the last 168 hours.  CBC: Recent Labs  Lab 06/05/17 0943 06/08/17 0802 06/10/17 0537   WBC 6.7 7.1 7.8  HGB 7.5* 8.4* 8.5*  HCT 22.9* 24.9* 26.8*  MCV 80.1 81.6 83.5  PLT 211 200 155    Cardiac Enzymes: No results for input(s): CKTOTAL, CKMB, CKMBINDEX, TROPONINI in the last 168 hours.  BNP (last 3 results) No results for input(s): BNP in the last 8760 hours.  ProBNP (last 3 results) No results for input(s): PROBNP in the last 8760 hours.  Radiological Exams on Admission: Dg Abd Portable 1v  Result Date: 06/09/2017 CLINICAL DATA:  Suspected ileus EXAM: PORTABLE ABDOMEN - 1 VIEW COMPARISON:  Abdominal radiograph of June 01, 2017 FINDINGS: A large amount of gas persists within presumably large bowel. No definite free extraluminal gas is observed. The bowel present is featureless. The esophagogastric tube tip and proximal port project below the expected location of the GE junction. IMPRESSION: Marked distention of bowel presumably colon throughout the abdomen. No definite free extraluminal gas but certainly small amounts of gas could be obscured by the overlying distended bowel. Given the persistent dilation over the past 8 days, abdominal CT scanning would be useful to more thoroughly evaluate the bowel. Electronically Signed   By: David  [place] M.D.   On: 06/09/2017 13:08    Assessment/Plan Active Problems:  Cardiac arrest (HCC)   Acute on chronic respiratory failure (HCC)   DNR (do not resuscitate) discussion   AKI (acute kidney injury) (HCC)   CHF (congestive heart failure) (HCC)   Obstructive sleep apnea   Acute kidney injury (HCC)   1.  acute on chronic Respiratory failure with hypoxia will continue with T collar trials patient is doing very well and I would like to advance him to capping today.  Continue with supportive care 2. Status post cardiac arrest clinically much improved  3. Congestive heart failure doing well seems to be compensated at this time.   4. Acute tubular necrosis with acute kidney injury clinically improved 5. Obstructive sleep apnea  will have to monitor once patient is decannulated hopefully   I have personally seen and evaluated the patient, evaluated laboratory and imaging results, formulated the assessment and plan and placed orders. The Patient requires high complexity decision making for assessment and support.  Case was discussed on Rounds with the Respiratory Therapy Staff  Yevonne Pax, MD [facility] Pulmonary Critical Care Medicine Sleep Medicine

## 2017-06-12 ENCOUNTER — Other Ambulatory Visit (HOSPITAL_COMMUNITY): Payer: Self-pay

## 2017-06-12 LAB — RENAL FUNCTION PANEL
ANION GAP: 12 (ref 5–15)
Albumin: 2.8 g/dL — ABNORMAL LOW (ref 3.5–5.0)
BUN: 53 mg/dL — ABNORMAL HIGH (ref 6–20)
CHLORIDE: 92 mmol/L — AB (ref 101–111)
CO2: 24 mmol/L (ref 22–32)
CREATININE: 1.67 mg/dL — AB (ref 0.61–1.24)
Calcium: 9.2 mg/dL (ref 8.9–10.3)
GFR, EST AFRICAN AMERICAN: 50 mL/min — AB (ref >60)
GFR, EST NON AFRICAN AMERICAN: 43 mL/min — AB (ref >60)
Glucose, Bld: 93 mg/dL (ref 65–99)
POTASSIUM: 4.8 mmol/L (ref 3.5–5.1)
Phosphorus: 5.8 mg/dL — ABNORMAL HIGH (ref 2.5–4.6)
Sodium: 128 mmol/L — ABNORMAL LOW (ref 135–145)

## 2017-06-12 LAB — CBC
HEMATOCRIT: 27.5 % — AB (ref 39.0–52.0)
Hemoglobin: 8.9 g/dL — ABNORMAL LOW (ref 13.0–17.0)
MCH: 27.1 pg (ref 26.0–34.0)
MCHC: 32.4 g/dL (ref 30.0–36.0)
MCV: 83.6 fL (ref 78.0–100.0)
PLATELETS: 149 10*3/uL — AB (ref 150–400)
RBC: 3.29 MIL/uL — AB (ref 4.22–5.81)
RDW: 18.2 % — ABNORMAL HIGH (ref 11.5–15.5)
WBC: 7.8 10*3/uL (ref 4.0–10.5)

## 2017-06-12 LAB — TRIGLYCERIDES: Triglycerides: 62 mg/dL (ref <150)

## 2017-06-12 MED ORDER — IOPAMIDOL (ISOVUE-300) INJECTION 61%
100.0000 mL | Freq: Once | INTRAVENOUS | Status: AC | PRN
  Administered 2017-06-12: 100 mL via INTRAVENOUS

## 2017-06-12 NOTE — Progress Notes (Signed)
[facility]  ROUNDING NOTE   Subjective:   Patient more urine output.   Creatinine 1.67 (1.89)  Objective:  Vital signs in last 24 hours:  Temperature 98.1 pulse 100 respirations 29 blood pressure 122/63  Physical Exam: General: Chronically ill-appearing  Head: Severe temporal wasting  Eyes: Anicteric  Neck: Tracheostomy in place  Lungs:  Scattered rhonchi, vent assisted  Heart: S1S2 no rubs  Abdomen:  Soft, nontender, bowel sounds present  Extremities: No peripheral edema.  Neurologic: Not following commands  Skin: No lesions  Access: Right subclavain temporary dialysis catheter    Basic Metabolic Panel: Recent Labs  Lab 06/07/17 0847 06/08/17 0802 06/10/17 0537 06/12/17 0609  NA 128* 126* 132* 128*  K 4.6 4.6 4.6 4.8  CL 90* 87* 96* 92*  CO2 27 24 23 24   GLUCOSE 106* 98 102* 93  BUN 90* 99* 64* 53*  CREATININE 2.86* 2.81* 1.89* 1.67*  CALCIUM 9.1 9.1 9.3 9.2  MG 2.2  --  2.0  --   PHOS 6.5* 7.2* 4.9* 5.8*    Liver Function Tests: Recent Labs  Lab 06/07/17 0847 06/08/17 0802 06/10/17 0537 06/12/17 0609  AST 38  --  39  --   ALT 15*  --  18  --   ALKPHOS 201*  --  210*  --   BILITOT 1.1  --  1.0  --   PROT 8.0  --  8.1  --   ALBUMIN 2.7* 2.9* 2.8* 2.8*   No results for input(s): LIPASE, AMYLASE in the last 168 hours. No results for input(s): AMMONIA in the last 168 hours.  CBC: Recent Labs  Lab 06/08/17 0802 06/10/17 0537 06/12/17 0609  WBC 7.1 7.8 7.8  HGB 8.4* 8.5* 8.9*  HCT 24.9* 26.8* 27.5*  MCV 81.6 83.5 83.6  PLT 200 155 149*    Cardiac Enzymes: No results for input(s): CKTOTAL, CKMB, CKMBINDEX, TROPONINI in the last 168 hours.  BNP: Invalid input(s): POCBNP  CBG: No results for input(s): GLUCAP in the last 168 hours.  Microbiology: Results for orders placed or performed during the hospital encounter of 05/24/17  MRSA PCR Screening     Status: Abnormal   Collection Time: 05/24/17  6:01 AM  Result Value Ref  Range Status   MRSA by PCR POSITIVE (A) NEGATIVE Final    Comment:        The GeneXpert MRSA Assay (FDA approved for NASAL specimens only), is one component of a comprehensive MRSA colonization surveillance program. It is not intended to diagnose MRSA infection nor to guide or monitor treatment for MRSA infections. RESULT CALLED TO, READ BACK BY AND VERIFIED WITHAlgis Downs [facility] RN AT [phone] 05/24/17 BY A.DAVIS Performed at [facility], [address] [place], [place] [address]   Fungus culture, blood     Status: None   Collection Time: 05/24/17  6:15 AM  Result Value Ref Range Status   Specimen Description BLOOD RIGHT HAND  Final   Special Requests   Final    BOTTLES DRAWN AEROBIC ONLY Blood Culture results may not be optimal due to an inadequate volume of blood received in culture bottles   Culture   Final    NO GROWTH 7 DAYS NO FUNGUS ISOLATED Performed at [facility], [address] [place], [place] [address]    Report Status 05/31/2017 FINAL  Final  Culture, blood (routine x 2)     Status: None   Collection Time: 05/24/17  6:20 AM  Result Value Ref Range Status   Specimen Description BLOOD RIGHT HAND  Final   Special Requests   Final    BOTTLES DRAWN AEROBIC ONLY Blood Culture results may not be optimal due to an inadequate volume of blood received in culture bottles   Culture   Final    NO GROWTH 5 DAYS Performed at [facility], [address] [place], [place] [address]    Report Status 06/08/2017 FINAL  Final  Culture, blood (routine x 2)     Status: None   Collection Time: 05/24/17  6:41 AM  Result Value Ref Range Status   Specimen Description BLOOD LEFT ANTECUBITAL  Final   Special Requests   Final    BOTTLES DRAWN AEROBIC ONLY Blood Culture results may not be optimal due to an inadequate volume of blood received in culture bottles   Culture   Final    NO GROWTH 5 DAYS Performed at [facility], [address], [place], [place] [address]     Report Status 05/19/2017 FINAL  Final  Culture, respiratory (NON-Expectorated)     Status: None   Collection Time: 05/24/17  3:25 PM  Result Value Ref Range Status   Specimen Description TRACHEAL ASPIRATE  Final   Special Requests NONE  Final   Gram Stain   Final    ABUNDANT WBC PRESENT,BOTH PMN AND MONONUCLEAR RARE SQUAMOUS EPITHELIAL CELLS PRESENT RARE GRAM POSITIVE COCCI IN PAIRS IN CHAINS RARE GRAM NEGATIVE COCCOBACILLI    Culture   Final    FEW Consistent with normal respiratory flora. Performed at [facility], [address] [place], [place] [address]    Report Status 05/27/2017 FINAL  Final    Coagulation Studies: No results for input(s): LABPROT, INR in the last 72 hours.  Urinalysis: No results for input(s): COLORURINE, LABSPEC, PHURINE, GLUCOSEU, HGBUR, BILIRUBINUR, KETONESUR, PROTEINUR, UROBILINOGEN, NITRITE, LEUKOCYTESUR in the last 72 hours.  Invalid input(s): APPERANCEUR    Imaging: Ct Abdomen Pelvis W Contrast  Result Date: 06/12/2017 CLINICAL DATA:  Acute abdominal pain. Persistent ileus. History of Crohn's, short gut syndrome, malnutrition, congestive heart failure, and anemia. EXAM: CT ABDOMEN AND PELVIS WITH CONTRAST TECHNIQUE: Multidetector CT imaging of the abdomen and pelvis was performed using the standard protocol following bolus administration of intravenous contrast. CONTRAST:  ISOVUE-300 IOPAMIDOL (ISOVUE-300) INJECTION 61% COMPARISON:  None. FINDINGS: Lower chest: Coarse infiltrates and consolidation in both lung bases, greater on the right. There is evidence of bronchiectasis on the right. Debris is demonstrated within the right lower lobe bronchi suggesting aspiration or mucous plugging. An enteric tube is present. Hepatobiliary: No focal liver abnormality is seen. No gallstones, gallbladder wall thickening, or biliary dilatation. Pancreas: Unremarkable. No pancreatic ductal dilatation or surrounding inflammatory changes. Spleen: Normal in  size without focal abnormality. Adrenals/Urinary Tract: 2 mm stone in the upper pole left kidney. 13 mm stone in the midpole right kidney. No hydronephrosis or hydroureter. Nephrograms are symmetrical. Bladder is decompressed with a Foley catheter. No adrenal gland nodules. Stomach/Bowel: Enteric tube tip is within the decompressed stomach. Small bowel are decompressed. Diffusely distended gas and fluid-filled colon, likely due to ileus. No colonic wall thickening. A rectal balloon catheter is present. The appendix is not identified. Vascular/Lymphatic: No significant vascular findings are present. No enlarged abdominal or pelvic lymph nodes. Reproductive: Prostate gland is not enlarged. Other: No free air or free fluid in the abdomen. Abdominal wall musculature appears intact. Cachexia. Musculoskeletal: No acute or significant osseous  findings. IMPRESSION: 1. Distended colon filled with gas and fluid, likely due to ileus. A balloon rectal catheter is in place. Stomach and small bowel are decompressed. 2. Coarse infiltrates and consolidation in both lung bases with bronchiectasis on the right. Debris in the right lower lobe bronchi suggest aspiration or mucous plugging. Changes may represent pneumonia or aspiration pneumonia. 3. Bilateral nonobstructing renal stones. Electronically Signed   By: Burman Nieves M.D.   On: 06/12/2017 01:04     Medications:     lidocaine (PF)  Assessment/ Plan:  62 y.o. 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   1.   Severe acute renal failure. 2.  Hyponatremia. 3.  Acute respiratory failure. 4.  Anemia unspecified. 5.  Severe protein calorie malnutrition with severe emaciation.  Plan: Will hold hemodialysis at this time. Will monitor closely for dialysis need.    LOS: 0 Vicie Cech 4/26/20193:44 PM

## 2017-06-12 NOTE — Progress Notes (Signed)
Pulmonary Critical Care Medicine [facility] [place]   PULMONARY SERVICE  PROGRESS NOTE  Date of Service: 06/12/2017  Gildardo Lardner  ZOX:[id number]  DOB: 13-Jun-1955   DOA: 05/23/2017  Referring Physician: Carron Curie, MD  HPI: Melvin Kelley is a 62 y.o. male seen for follow up of Acute on Chronic Respiratory Failure.  He is doing well apparently overnight self decannulated and had a tracheostomy put back in.  My nose on T collar has been on 20% oxygen.  Medications: Reviewed on Rounds  Physical Exam:  Vitals: Temperature is 96.7 pulse 94 respiratory rate 22 blood pressure 109/65 saturations 100%  Ventilator Settings aerosolized T collar FiO2 20%  . General: Comfortable at this time . Eyes: Grossly normal lids, irises & conjunctiva . ENT: grossly tongue is normal . Neck: no obvious mass . Cardiovascular: S1-S2 normal no gallop or rub . Respiratory: No rhonchi . Abdomen: Soft nondistended . Skin: no rash seen on limited exam . Musculoskeletal: not rigid . Psychiatric:unable to assess . Neurologic: no seizure no involuntary movements         Labs on Admission:  Basic Metabolic Panel: Recent Labs  Lab 06/07/17 0847 06/08/17 0802 06/10/17 0537 06/12/17 0609  NA 128* 126* 132* 128*  K 4.6 4.6 4.6 4.8  CL 90* 87* 96* 92*  CO2 27 24 23 24   GLUCOSE 106* 98 102* 93  BUN 90* 99* 64* 53*  CREATININE 2.86* 2.81* 1.89* 1.67*  CALCIUM 9.1 9.1 9.3 9.2  MG 2.2  --  2.0  --   PHOS 6.5* 7.2* 4.9* 5.8*    Liver Function Tests: Recent Labs  Lab 06/07/17 0847 06/08/17 0802 06/10/17 0537 06/12/17 0609  AST 38  --  39  --   ALT 15*  --  18  --   ALKPHOS 201*  --  210*  --   BILITOT 1.1  --  1.0  --   PROT 8.0  --  8.1  --   ALBUMIN 2.7* 2.9* 2.8* 2.8*   No results for input(s): LIPASE, AMYLASE in the last 168 hours. No results for input(s): AMMONIA in the last 168 hours.  CBC: Recent Labs  Lab 06/08/17 0802 06/10/17 0537 06/12/17 0609  WBC 7.1 7.8  7.8  HGB 8.4* 8.5* 8.9*  HCT 24.9* 26.8* 27.5*  MCV 81.6 83.5 83.6  PLT 200 155 149*    Cardiac Enzymes: No results for input(s): CKTOTAL, CKMB, CKMBINDEX, TROPONINI in the last 168 hours.  BNP (last 3 results) No results for input(s): BNP in the last 8760 hours.  ProBNP (last 3 results) No results for input(s): PROBNP in the last 8760 hours.  Radiological Exams on Admission: Ct Abdomen Pelvis W Contrast  Result Date: 06/12/2017 CLINICAL DATA:  Acute abdominal pain. Persistent ileus. History of Crohn's, short gut syndrome, malnutrition, congestive heart failure, and anemia. EXAM: CT ABDOMEN AND PELVIS WITH CONTRAST TECHNIQUE: Multidetector CT imaging of the abdomen and pelvis was performed using the standard protocol following bolus administration of intravenous contrast. CONTRAST:  ISOVUE-300 IOPAMIDOL (ISOVUE-300) INJECTION 61% COMPARISON:  None. FINDINGS: Lower chest: Coarse infiltrates and consolidation in both lung bases, greater on the right. There is evidence of bronchiectasis on the right. Debris is demonstrated within the right lower lobe bronchi suggesting aspiration or mucous plugging. An enteric tube is present. Hepatobiliary: No focal liver abnormality is seen. No gallstones, gallbladder wall thickening, or biliary dilatation. Pancreas: Unremarkable. No pancreatic ductal dilatation or surrounding inflammatory changes. Spleen: Normal in size without focal  abnormality. Adrenals/Urinary Tract: 2 mm stone in the upper pole left kidney. 13 mm stone in the midpole right kidney. No hydronephrosis or hydroureter. Nephrograms are symmetrical. Bladder is decompressed with a Foley catheter. No adrenal gland nodules. Stomach/Bowel: Enteric tube tip is within the decompressed stomach. Small bowel are decompressed. Diffusely distended gas and fluid-filled colon, likely due to ileus. No colonic wall thickening. A rectal balloon catheter is present. The appendix is not identified.  Vascular/Lymphatic: No significant vascular findings are present. No enlarged abdominal or pelvic lymph nodes. Reproductive: Prostate gland is not enlarged. Other: No free air or free fluid in the abdomen. Abdominal wall musculature appears intact. Cachexia. Musculoskeletal: No acute or significant osseous findings. IMPRESSION: 1. Distended colon filled with gas and fluid, likely due to ileus. A balloon rectal catheter is in place. Stomach and small bowel are decompressed. 2. Coarse infiltrates and consolidation in both lung bases with bronchiectasis on the right. Debris in the right lower lobe bronchi suggest aspiration or mucous plugging. Changes may represent pneumonia or aspiration pneumonia. 3. Bilateral nonobstructing renal stones. Electronically Signed   By: Burman Nieves M.D.   On: 06/12/2017 01:04   Dg Abd Portable 1v  Result Date: 06/09/2017 CLINICAL DATA:  Suspected ileus EXAM: PORTABLE ABDOMEN - 1 VIEW COMPARISON:  Abdominal radiograph of June 01, 2017 FINDINGS: A large amount of gas persists within presumably large bowel. No definite free extraluminal gas is observed. The bowel present is featureless. The esophagogastric tube tip and proximal port project below the expected location of the GE junction. IMPRESSION: Marked distention of bowel presumably colon throughout the abdomen. No definite free extraluminal gas but certainly small amounts of gas could be obscured by the overlying distended bowel. Given the persistent dilation over the past 8 days, abdominal CT scanning would be useful to more thoroughly evaluate the bowel. Electronically Signed   By: David  [place] M.D.   On: 06/09/2017 13:08    Assessment/Plan Active Problems:   Cardiac arrest (HCC)   Acute on chronic respiratory failure (HCC)   DNR (do not resuscitate) discussion   AKI (acute kidney injury) (HCC)   CHF (congestive heart failure) (HCC)   Obstructive sleep apnea   Acute kidney injury (HCC)   1. Acute on chronic  respiratory failure with hypoxia continue with weaning remains on T collar will downsize the trach to a size 4 trach and try to see about capping trials 2. Acute kidney injury followed by nephrology will continue with supportive care dialysis 3. CHF compensated at this time 4. Status post cardiac arrest 5. Obstructive sleep apnea up apnea not an issue at this time   I have personally seen and evaluated the patient, evaluated laboratory and imaging results, formulated the assessment and plan and placed orders. The Patient requires high complexity decision making for assessment and support.  Case was discussed on Rounds with the Respiratory Therapy Staff  Yevonne Pax, MD [facility] Pulmonary Critical Care Medicine Sleep Medicine

## 2017-06-13 LAB — BLOOD GAS, ARTERIAL
Acid-Base Excess: 0.8 mmol/L (ref 0.0–2.0)
BICARBONATE: 25.8 mmol/L (ref 20.0–28.0)
FIO2: 35
O2 SAT: 88.7 %
PATIENT TEMPERATURE: 98.6
PO2 ART: 61.3 mmHg — AB (ref 83.0–108.0)
pCO2 arterial: 48.1 mmHg — ABNORMAL HIGH (ref 32.0–48.0)
pH, Arterial: 7.349 — ABNORMAL LOW (ref 7.350–7.450)

## 2017-06-13 LAB — COMPREHENSIVE METABOLIC PANEL
ALBUMIN: 2.9 g/dL — AB (ref 3.5–5.0)
ALT: 36 U/L (ref 17–63)
ANION GAP: 10 (ref 5–15)
AST: 46 U/L — AB (ref 15–41)
Alkaline Phosphatase: 235 U/L — ABNORMAL HIGH (ref 38–126)
BILIRUBIN TOTAL: 1.4 mg/dL — AB (ref 0.3–1.2)
BUN: 73 mg/dL — AB (ref 6–20)
CO2: 26 mmol/L (ref 22–32)
Calcium: 9.1 mg/dL (ref 8.9–10.3)
Chloride: 90 mmol/L — ABNORMAL LOW (ref 101–111)
Creatinine, Ser: 1.97 mg/dL — ABNORMAL HIGH (ref 0.61–1.24)
GFR calc Af Amer: 41 mL/min — ABNORMAL LOW (ref >60)
GFR calc non Af Amer: 35 mL/min — ABNORMAL LOW (ref >60)
GLUCOSE: 136 mg/dL — AB (ref 65–99)
Potassium: 4.5 mmol/L (ref 3.5–5.1)
Sodium: 126 mmol/L — ABNORMAL LOW (ref 135–145)
TOTAL PROTEIN: 8.7 g/dL — AB (ref 6.5–8.1)

## 2017-06-13 LAB — PHOSPHORUS: Phosphorus: 6.3 mg/dL — ABNORMAL HIGH (ref 2.5–4.6)

## 2017-06-13 LAB — MAGNESIUM: Magnesium: 2 mg/dL (ref 1.7–2.4)

## 2017-06-13 NOTE — Progress Notes (Signed)
Pulmonary Critical Care Medicine [facility] [place]   PULMONARY SERVICE  PROGRESS NOTE  Date of Service: 06/13/2017  Melvin Kelley  NOB:[id number]  DOB: 08-12-1955   DOA: 05/25/2017  Referring Physician: Carron Curie, MD  HPI: Melvin Kelley is a 62 y.o. male seen for follow up of Acute on Chronic Respiratory Failure.  He is doing well on T collar hopefully we should be able to try him on capping  Medications: Reviewed on Rounds  Physical Exam:  Vitals: Temperature 97.9 pulse 97 respiratory rate 32 blood pressure 130/68 saturations 98%  Ventilator Settings currently on T collar with 28% FiO2  . General: Comfortable at this time . Eyes: Grossly normal lids, irises & conjunctiva . ENT: grossly tongue is normal . Neck: no obvious mass . Cardiovascular: S1-S2 normal no gallop or rub . Respiratory: No rhonchi . Abdomen: Abdomen soft . Skin: no rash seen on limited exam . Musculoskeletal: not rigid . Psychiatric:unable to assess . Neurologic: no seizure no involuntary movements         Labs on Admission:  Basic Metabolic Panel: Recent Labs  Lab 06/07/17 0847 06/08/17 0802 06/10/17 0537 06/12/17 0609 06/13/17 0532  NA 128* 126* 132* 128* 126*  K 4.6 4.6 4.6 4.8 4.5  CL 90* 87* 96* 92* 90*  CO2 27 24 23 24 26   GLUCOSE 106* 98 102* 93 136*  BUN 90* 99* 64* 53* 73*  CREATININE 2.86* 2.81* 1.89* 1.67* 1.97*  CALCIUM 9.1 9.1 9.3 9.2 9.1  MG 2.2  --  2.0  --  2.0  PHOS 6.5* 7.2* 4.9* 5.8* 6.3*    Liver Function Tests: Recent Labs  Lab 06/07/17 0847 06/08/17 0802 06/10/17 0537 06/12/17 0609 06/13/17 0532  AST 38  --  39  --  46*  ALT 15*  --  18  --  36  ALKPHOS 201*  --  210*  --  235*  BILITOT 1.1  --  1.0  --  1.4*  PROT 8.0  --  8.1  --  8.7*  ALBUMIN 2.7* 2.9* 2.8* 2.8* 2.9*   No results for input(s): LIPASE, AMYLASE in the last 168 hours. No results for input(s): AMMONIA in the last 168 hours.  CBC: Recent Labs  Lab 06/08/17 0802  06/10/17 0537 06/12/17 0609  WBC 7.1 7.8 7.8  HGB 8.4* 8.5* 8.9*  HCT 24.9* 26.8* 27.5*  MCV 81.6 83.5 83.6  PLT 200 155 149*    Cardiac Enzymes: No results for input(s): CKTOTAL, CKMB, CKMBINDEX, TROPONINI in the last 168 hours.  BNP (last 3 results) No results for input(s): BNP in the last 8760 hours.  ProBNP (last 3 results) No results for input(s): PROBNP in the last 8760 hours.  Radiological Exams on Admission: Ct Abdomen Pelvis W Contrast  Result Date: 06/12/2017 CLINICAL DATA:  Acute abdominal pain. Persistent ileus. History of Crohn's, short gut syndrome, malnutrition, congestive heart failure, and anemia. EXAM: CT ABDOMEN AND PELVIS WITH CONTRAST TECHNIQUE: Multidetector CT imaging of the abdomen and pelvis was performed using the standard protocol following bolus administration of intravenous contrast. CONTRAST:  ISOVUE-300 IOPAMIDOL (ISOVUE-300) INJECTION 61% COMPARISON:  None. FINDINGS: Lower chest: Coarse infiltrates and consolidation in both lung bases, greater on the right. There is evidence of bronchiectasis on the right. Debris is demonstrated within the right lower lobe bronchi suggesting aspiration or mucous plugging. An enteric tube is present. Hepatobiliary: No focal liver abnormality is seen. No gallstones, gallbladder wall thickening, or biliary dilatation. Pancreas: Unremarkable. No pancreatic  ductal dilatation or surrounding inflammatory changes. Spleen: Normal in size without focal abnormality. Adrenals/Urinary Tract: 2 mm stone in the upper pole left kidney. 13 mm stone in the midpole right kidney. No hydronephrosis or hydroureter. Nephrograms are symmetrical. Bladder is decompressed with a Foley catheter. No adrenal gland nodules. Stomach/Bowel: Enteric tube tip is within the decompressed stomach. Small bowel are decompressed. Diffusely distended gas and fluid-filled colon, likely due to ileus. No colonic wall thickening. A rectal balloon catheter is present.  The appendix is not identified. Vascular/Lymphatic: No significant vascular findings are present. No enlarged abdominal or pelvic lymph nodes. Reproductive: Prostate gland is not enlarged. Other: No free air or free fluid in the abdomen. Abdominal wall musculature appears intact. Cachexia. Musculoskeletal: No acute or significant osseous findings. IMPRESSION: 1. Distended colon filled with gas and fluid, likely due to ileus. A balloon rectal catheter is in place. Stomach and small bowel are decompressed. 2. Coarse infiltrates and consolidation in both lung bases with bronchiectasis on the right. Debris in the right lower lobe bronchi suggest aspiration or mucous plugging. Changes may represent pneumonia or aspiration pneumonia. 3. Bilateral nonobstructing renal stones. Electronically Signed   By: Burman Nieves M.D.   On: 06/12/2017 01:04   Dg Abd Portable 1v  Result Date: 06/09/2017 CLINICAL DATA:  Suspected ileus EXAM: PORTABLE ABDOMEN - 1 VIEW COMPARISON:  Abdominal radiograph of June 01, 2017 FINDINGS: A large amount of gas persists within presumably large bowel. No definite free extraluminal gas is observed. The bowel present is featureless. The esophagogastric tube tip and proximal port project below the expected location of the GE junction. IMPRESSION: Marked distention of bowel presumably colon throughout the abdomen. No definite free extraluminal gas but certainly small amounts of gas could be obscured by the overlying distended bowel. Given the persistent dilation over the past 8 days, abdominal CT scanning would be useful to more thoroughly evaluate the bowel. Electronically Signed   By: David  [place] M.D.   On: 06/09/2017 13:08    Assessment/Plan Active Problems:   Cardiac arrest (HCC)   Acute on chronic respiratory failure (HCC)   DNR (do not resuscitate) discussion   AKI (acute kidney injury) (HCC)   CHF (congestive heart failure) (HCC)   Obstructive sleep apnea   Acute kidney injury  (HCC)   1. Acute on chronic respiratory failure with hypoxia continue with T collar and start capping today we will continue pulmonary toilet and secretion management 2. Acute kidney injury followed by nephrology for dialysis will continue supportive care 3. Congestive heart failure compensated 4. Sleep apnea at baseline 5. Encephalopathy continues to show some improvement   I have personally seen and evaluated the patient, evaluated laboratory and imaging results, formulated the assessment and plan and placed orders. The Patient requires high complexity decision making for assessment and support.  Case was discussed on Rounds with the Respiratory Therapy Staff  Yevonne Pax, MD [facility] Pulmonary Critical Care Medicine Sleep Medicine

## 2017-06-14 NOTE — Progress Notes (Signed)
Pulmonary Critical Care Medicine [facility] [place]   PULMONARY SERVICE  PROGRESS NOTE  Date of Service: 06/14/2017  Melvin Kelley  YTK:[id number]  DOB: 1955-06-08   DOA: 06-28-2017  Referring Physician: Carron Curie, MD  HPI: Melvin Kelley is a 62 y.o. male seen for follow up of Acute on Chronic Respiratory Failure.  Right now patient is doing well comfortable without distress.  Has been on T collar trials.  Was attempted capping however did not tolerate.  Right now on 35% oxygen without distress.  Medications: Reviewed on Rounds  Physical Exam:  Vitals: Temperature 97.4 pulse 92 respiratory rate 23 blood pressure 104/69 saturations 100%  Ventilator Settings off of the ventilator on T collar  . General: Comfortable at this time . Eyes: Grossly normal lids, irises & conjunctiva . ENT: grossly tongue is normal . Neck: no obvious mass . Cardiovascular: S1-S2 normal no gallop or rub . Respiratory: No rhonchi expansion is equal . Abdomen: Soft nontender . Skin: no rash seen on limited exam . Musculoskeletal: not rigid . Psychiatric:unable to assess . Neurologic: no seizure no involuntary movements         Labs on Admission:  Basic Metabolic Panel: Recent Labs  Lab 06/08/17 0802 06/10/17 0537 06/12/17 0609 06/13/17 0532  NA 126* 132* 128* 126*  K 4.6 4.6 4.8 4.5  CL 87* 96* 92* 90*  CO2 24 23 24 26   GLUCOSE 98 102* 93 136*  BUN 99* 64* 53* 73*  CREATININE 2.81* 1.89* 1.67* 1.97*  CALCIUM 9.1 9.3 9.2 9.1  MG  --  2.0  --  2.0  PHOS 7.2* 4.9* 5.8* 6.3*    Liver Function Tests: Recent Labs  Lab 06/08/17 0802 06/10/17 0537 06/12/17 0609 06/13/17 0532  AST  --  39  --  46*  ALT  --  18  --  36  ALKPHOS  --  210*  --  235*  BILITOT  --  1.0  --  1.4*  PROT  --  8.1  --  8.7*  ALBUMIN 2.9* 2.8* 2.8* 2.9*   No results for input(s): LIPASE, AMYLASE in the last 168 hours. No results for input(s): AMMONIA in the last 168 hours.  CBC: Recent  Labs  Lab 06/08/17 0802 06/10/17 0537 06/12/17 0609  WBC 7.1 7.8 7.8  HGB 8.4* 8.5* 8.9*  HCT 24.9* 26.8* 27.5*  MCV 81.6 83.5 83.6  PLT 200 155 149*    Cardiac Enzymes: No results for input(s): CKTOTAL, CKMB, CKMBINDEX, TROPONINI in the last 168 hours.  BNP (last 3 results) No results for input(s): BNP in the last 8760 hours.  ProBNP (last 3 results) No results for input(s): PROBNP in the last 8760 hours.  Radiological Exams on Admission: Ct Abdomen Pelvis W Contrast  Result Date: 06/12/2017 CLINICAL DATA:  Acute abdominal pain. Persistent ileus. History of Crohn's, short gut syndrome, malnutrition, congestive heart failure, and anemia. EXAM: CT ABDOMEN AND PELVIS WITH CONTRAST TECHNIQUE: Multidetector CT imaging of the abdomen and pelvis was performed using the standard protocol following bolus administration of intravenous contrast. CONTRAST:  ISOVUE-300 IOPAMIDOL (ISOVUE-300) INJECTION 61% COMPARISON:  None. FINDINGS: Lower chest: Coarse infiltrates and consolidation in both lung bases, greater on the right. There is evidence of bronchiectasis on the right. Debris is demonstrated within the right lower lobe bronchi suggesting aspiration or mucous plugging. An enteric tube is present. Hepatobiliary: No focal liver abnormality is seen. No gallstones, gallbladder wall thickening, or biliary dilatation. Pancreas: Unremarkable. No pancreatic ductal dilatation  or surrounding inflammatory changes. Spleen: Normal in size without focal abnormality. Adrenals/Urinary Tract: 2 mm stone in the upper pole left kidney. 13 mm stone in the midpole right kidney. No hydronephrosis or hydroureter. Nephrograms are symmetrical. Bladder is decompressed with a Foley catheter. No adrenal gland nodules. Stomach/Bowel: Enteric tube tip is within the decompressed stomach. Small bowel are decompressed. Diffusely distended gas and fluid-filled colon, likely due to ileus. No colonic wall thickening. A rectal  balloon catheter is present. The appendix is not identified. Vascular/Lymphatic: No significant vascular findings are present. No enlarged abdominal or pelvic lymph nodes. Reproductive: Prostate gland is not enlarged. Other: No free air or free fluid in the abdomen. Abdominal wall musculature appears intact. Cachexia. Musculoskeletal: No acute or significant osseous findings. IMPRESSION: 1. Distended colon filled with gas and fluid, likely due to ileus. A balloon rectal catheter is in place. Stomach and small bowel are decompressed. 2. Coarse infiltrates and consolidation in both lung bases with bronchiectasis on the right. Debris in the right lower lobe bronchi suggest aspiration or mucous plugging. Changes may represent pneumonia or aspiration pneumonia. 3. Bilateral nonobstructing renal stones. Electronically Signed   By: Burman Nieves M.D.   On: 06/12/2017 01:04    Assessment/Plan Active Problems:   Cardiac arrest (HCC)   Acute on chronic respiratory failure (HCC)   DNR (do not resuscitate) discussion   AKI (acute kidney injury) (HCC)   CHF (congestive heart failure) (HCC)   Obstructive sleep apnea   Acute kidney injury (HCC)   1. Acute on chronic respiratory failure with hypoxia now is at a new baseline with aerosolized T collar will continue with T collar as tolerated 2. Acute kidney injury following with nephrology we will continue supportive care 3. Congestive heart failure compensated 4. Sleep apnea nonissue at this time 5. Status post cardiac arrest   I have personally seen and evaluated the patient, evaluated laboratory and imaging results, formulated the assessment and plan and placed orders. The Patient requires high complexity decision making for assessment and support.  Case was discussed on Rounds with the Respiratory Therapy Staff  Yevonne Pax, MD [facility] Pulmonary Critical Care Medicine Sleep Medicine

## 2017-06-15 ENCOUNTER — Other Ambulatory Visit (HOSPITAL_COMMUNITY): Payer: Self-pay

## 2017-06-15 LAB — BLOOD GAS, ARTERIAL
ACID-BASE DEFICIT: 1.2 mmol/L (ref 0.0–2.0)
Acid-base deficit: 1.4 mmol/L (ref 0.0–2.0)
Bicarbonate: 26.2 mmol/L (ref 20.0–28.0)
Bicarbonate: 24.2 mmol/L (ref 20.0–28.0)
FIO2: 0.4
FIO2: 40
LHR: 18 {breaths}/min
O2 SAT: 96.7 %
O2 Saturation: 93 %
PATIENT TEMPERATURE: 98.6
PCO2 ART: 50.1 mmHg — AB (ref 32.0–48.0)
PEEP: 5 cmH2O
PO2 ART: 83.4 mmHg (ref 83.0–108.0)
Patient temperature: 98.6
VT: 450 mL
pCO2 arterial: 74.6 mmHg (ref 32.0–48.0)
pH, Arterial: 7.171 — CL (ref 7.350–7.450)
pH, Arterial: 7.306 — ABNORMAL LOW (ref 7.350–7.450)
pO2, Arterial: 88.6 mmHg (ref 83.0–108.0)

## 2017-06-15 LAB — COMPREHENSIVE METABOLIC PANEL
ALT: 28 U/L (ref 17–63)
AST: 45 U/L — ABNORMAL HIGH (ref 15–41)
Albumin: 2.4 g/dL — ABNORMAL LOW (ref 3.5–5.0)
Alkaline Phosphatase: 234 U/L — ABNORMAL HIGH (ref 38–126)
Anion gap: 10 (ref 5–15)
BUN: 109 mg/dL — ABNORMAL HIGH (ref 6–20)
CO2: 24 mmol/L (ref 22–32)
Calcium: 8.4 mg/dL — ABNORMAL LOW (ref 8.9–10.3)
Chloride: 89 mmol/L — ABNORMAL LOW (ref 101–111)
Creatinine, Ser: 2.47 mg/dL — ABNORMAL HIGH (ref 0.61–1.24)
GFR calc Af Amer: 31 mL/min — ABNORMAL LOW (ref >60)
GFR calc non Af Amer: 27 mL/min — ABNORMAL LOW (ref >60)
Glucose, Bld: 180 mg/dL — ABNORMAL HIGH (ref 65–99)
Potassium: 5.6 mmol/L — ABNORMAL HIGH (ref 3.5–5.1)
Sodium: 123 mmol/L — ABNORMAL LOW (ref 135–145)
Total Bilirubin: 1.1 mg/dL (ref 0.3–1.2)
Total Protein: 7.8 g/dL (ref 6.5–8.1)

## 2017-06-15 LAB — CBC WITH DIFFERENTIAL/PLATELET
BASOS PCT: 0 %
Basophils Absolute: 0 10*3/uL (ref 0.0–0.1)
Eosinophils Absolute: 0 10*3/uL (ref 0.0–0.7)
Eosinophils Relative: 1 %
HEMATOCRIT: 24.4 % — AB (ref 39.0–52.0)
HEMOGLOBIN: 7.9 g/dL — AB (ref 13.0–17.0)
Lymphocytes Relative: 4 %
Lymphs Abs: 0.3 10*3/uL — ABNORMAL LOW (ref 0.7–4.0)
MCH: 26.8 pg (ref 26.0–34.0)
MCHC: 32.4 g/dL (ref 30.0–36.0)
MCV: 82.7 fL (ref 78.0–100.0)
MONOS PCT: 17 %
Monocytes Absolute: 1 10*3/uL (ref 0.1–1.0)
NEUTROS ABS: 4.8 10*3/uL (ref 1.7–7.7)
NEUTROS PCT: 78 %
Platelets: 188 10*3/uL (ref 150–400)
RBC: 2.95 MIL/uL — ABNORMAL LOW (ref 4.22–5.81)
RDW: 18 % — ABNORMAL HIGH (ref 11.5–15.5)
WBC: 6.2 10*3/uL (ref 4.0–10.5)

## 2017-06-15 LAB — MAGNESIUM: Magnesium: 2.2 mg/dL (ref 1.7–2.4)

## 2017-06-15 LAB — TROPONIN I: Troponin I: <0.03 ng/mL (ref <0.03)

## 2017-06-15 LAB — PHOSPHORUS: PHOSPHORUS: 8.3 mg/dL — AB (ref 2.5–4.6)

## 2017-06-15 NOTE — Progress Notes (Signed)
Pulmonary Critical Care Medicine [facility] [place]   PULMONARY SERVICE  PROGRESS NOTE  Date of Service: 06/15/2017  Melvin Kelley  JQG:[id number]  DOB: 19-Aug-1955   DOA: 06/06/2017  Referring Physician: Carron Curie, MD  HPI: Melvin Kelley is a 62 y.o. male seen for follow up of Acute on Chronic Respiratory Failure.  Patient is on full support right now is on assist control mode patient currently is on 40% oxygen.  Chest x-ray that was done showed increased abdominal air so I have ordered a KUB to be done.  Medications: Reviewed on Rounds  Physical Exam:  Vitals: Temperature 97.1 pulse 112 respiratory rate 35 blood pressure 103/50 saturations 99%  Ventilator Settings mode of ventilation assist control FiO2 40% tidal volume 477 PEEP 5  . General: Comfortable at this time . Eyes: Grossly normal lids, irises & conjunctiva . ENT: grossly tongue is normal . Neck: no obvious mass . Cardiovascular: S1-S2 normal no gallop or rub . Respiratory: No rhonchi expansion is equal . Abdomen: Soft nontender . Skin: no rash seen on limited exam . Musculoskeletal: not rigid . Psychiatric:unable to assess . Neurologic: no seizure no involuntary movements         Labs on Admission:  Basic Metabolic Panel: Recent Labs  Lab 06/10/17 0537 06/12/17 0609 06/13/17 0532 06/15/17 0628  NA 132* 128* 126* 123*  K 4.6 4.8 4.5 5.6*  CL 96* 92* 90* 89*  CO2 23 24 26 24   GLUCOSE 102* 93 136* 180*  BUN 64* 53* 73* 109*  CREATININE 1.89* 1.67* 1.97* 2.47*  CALCIUM 9.3 9.2 9.1 8.4*  MG 2.0  --  2.0 2.2  PHOS 4.9* 5.8* 6.3* 8.3*    Liver Function Tests: Recent Labs  Lab 06/10/17 0537 06/12/17 0609 06/13/17 0532 06/15/17 0628  AST 39  --  46* 45*  ALT 18  --  36 28  ALKPHOS 210*  --  235* 234*  BILITOT 1.0  --  1.4* 1.1  PROT 8.1  --  8.7* 7.8  ALBUMIN 2.8* 2.8* 2.9* 2.4*   No results for input(s): LIPASE, AMYLASE in the last 168 hours. No results for input(s): AMMONIA  in the last 168 hours.  CBC: Recent Labs  Lab 06/10/17 0537 06/12/17 0609 06/15/17 0628  WBC 7.8 7.8 6.2  NEUTROABS  --   --  4.8  HGB 8.5* 8.9* 7.9*  HCT 26.8* 27.5* 24.4*  MCV 83.5 83.6 82.7  PLT 155 149* 188    Cardiac Enzymes: Recent Labs  Lab 06/15/17 0628  TROPONINI <0.03    BNP (last 3 results) No results for input(s): BNP in the last 8760 hours.  ProBNP (last 3 results) No results for input(s): PROBNP in the last 8760 hours.  Radiological Exams on Admission: Ct Abdomen Pelvis W Contrast  Result Date: 06/12/2017 CLINICAL DATA:  Acute abdominal pain. Persistent ileus. History of Crohn's, short gut syndrome, malnutrition, congestive heart failure, and anemia. EXAM: CT ABDOMEN AND PELVIS WITH CONTRAST TECHNIQUE: Multidetector CT imaging of the abdomen and pelvis was performed using the standard protocol following bolus administration of intravenous contrast. CONTRAST:  ISOVUE-300 IOPAMIDOL (ISOVUE-300) INJECTION 61% COMPARISON:  None. FINDINGS: Lower chest: Coarse infiltrates and consolidation in both lung bases, greater on the right. There is evidence of bronchiectasis on the right. Debris is demonstrated within the right lower lobe bronchi suggesting aspiration or mucous plugging. An enteric tube is present. Hepatobiliary: No focal liver abnormality is seen. No gallstones, gallbladder wall thickening, or biliary dilatation. Pancreas:  Unremarkable. No pancreatic ductal dilatation or surrounding inflammatory changes. Spleen: Normal in size without focal abnormality. Adrenals/Urinary Tract: 2 mm stone in the upper pole left kidney. 13 mm stone in the midpole right kidney. No hydronephrosis or hydroureter. Nephrograms are symmetrical. Bladder is decompressed with a Foley catheter. No adrenal gland nodules. Stomach/Bowel: Enteric tube tip is within the decompressed stomach. Small bowel are decompressed. Diffusely distended gas and fluid-filled colon, likely due to ileus. No  colonic wall thickening. A rectal balloon catheter is present. The appendix is not identified. Vascular/Lymphatic: No significant vascular findings are present. No enlarged abdominal or pelvic lymph nodes. Reproductive: Prostate gland is not enlarged. Other: No free air or free fluid in the abdomen. Abdominal wall musculature appears intact. Cachexia. Musculoskeletal: No acute or significant osseous findings. IMPRESSION: 1. Distended colon filled with gas and fluid, likely due to ileus. A balloon rectal catheter is in place. Stomach and small bowel are decompressed. 2. Coarse infiltrates and consolidation in both lung bases with bronchiectasis on the right. Debris in the right lower lobe bronchi suggest aspiration or mucous plugging. Changes may represent pneumonia or aspiration pneumonia. 3. Bilateral nonobstructing renal stones. Electronically Signed   By: Burman Nieves M.D.   On: 06/12/2017 01:04   Dg Chest Port 1 View  Result Date: 06/15/2017 CLINICAL DATA:  Respiratory failure EXAM: PORTABLE CHEST 1 VIEW COMPARISON:  06/01/2017 FINDINGS: AICD remains in place with leads both in the right atrium. Tracheostomy tube, right PICC line and NG tube are unchanged. Interval placement of subclavian dialysis catheter with the tip in the upper right atrium. Worsening right lung airspace disease in the mid and lower lung zones. Decreasing lung volumes. No confluent opacity on the left. No visible effusions. Heart is normal size. IMPRESSION: Interval placement of right subclavian dialysis catheter with the tip in the right atrium. No pneumothorax. Worsening aeration with increasing right lung airspace disease. Electronically Signed   By: Charlett Nose M.D.   On: 06/15/2017 07:28    Assessment/Plan Active Problems:   Cardiac arrest (HCC)   Acute on chronic respiratory failure (HCC)   DNR (do not resuscitate) discussion   AKI (acute kidney injury) (HCC)   CHF (congestive heart failure) (HCC)   Obstructive sleep  apnea   Acute kidney injury (HCC)   1. Acute on chronic respiratory failure with hypoxia patient will continue with full support at night is able to wean today had been off of the ventilator over the weekend suspect some issues going on in the belly 2. Congestive heart failure needs diuresis based on the x-ray 3. Acute renal failure apparently dialysis held we will discuss with nephrology 4. Sleep apnea at baseline 5. Status post cardiac arrest   I have personally seen and evaluated the patient, evaluated laboratory and imaging results, formulated the assessment and plan and placed orders. The Patient requires high complexity decision making for assessment and support.  Case was discussed on Rounds with the Respiratory Therapy Staff  Yevonne Pax, MD [facility] Pulmonary Critical Care Medicine Sleep Medicine

## 2017-06-15 NOTE — Progress Notes (Signed)
[facility]  ROUNDING NOTE   Subjective:  Renal parameters worsening again. Patient appears to be more lethargic today.   Objective:  Vital signs in last 24 hours:  Temperature 97.1 pulse 112 respiration 39 blood pressure 103/50  Physical Exam: General: Chronically ill-appearing  Head: Severe temporal wasting  Eyes: Anicteric  Neck: Tracheostomy in place  Lungs:  Scattered rhonchi, vent assisted  Heart: S1S2 no rubs  Abdomen:  Soft, nontender, bowel sounds present  Extremities: No peripheral edema.  Neurologic: Not following commands  Skin: No lesions  Access: Right subclavain temporary dialysis catheter    Basic Metabolic Panel: Recent Labs  Lab 06/10/17 0537 06/12/17 0609 06/13/17 0532 06/15/17 0628  NA 132* 128* 126* 123*  K 4.6 4.8 4.5 5.6*  CL 96* 92* 90* 89*  CO2 23 24 26 24   GLUCOSE 102* 93 136* 180*  BUN 64* 53* 73* 109*  CREATININE 1.89* 1.67* 1.97* 2.47*  CALCIUM 9.3 9.2 9.1 8.4*  MG 2.0  --  2.0 2.2  PHOS 4.9* 5.8* 6.3* 8.3*    Liver Function Tests: Recent Labs  Lab 06/10/17 0537 06/12/17 0609 06/13/17 0532 06/15/17 0628  AST 39  --  46* 45*  ALT 18  --  36 28  ALKPHOS 210*  --  235* 234*  BILITOT 1.0  --  1.4* 1.1  PROT 8.1  --  8.7* 7.8  ALBUMIN 2.8* 2.8* 2.9* 2.4*   No results for input(s): LIPASE, AMYLASE in the last 168 hours. No results for input(s): AMMONIA in the last 168 hours.  CBC: Recent Labs  Lab 06/10/17 0537 06/12/17 0609 06/15/17 0628  WBC 7.8 7.8 6.2  NEUTROABS  --   --  4.8  HGB 8.5* 8.9* 7.9*  HCT 26.8* 27.5* 24.4*  MCV 83.5 83.6 82.7  PLT 155 149* 188    Cardiac Enzymes: Recent Labs  Lab 06/15/17 0628  TROPONINI <0.03    BNP: Invalid input(s): POCBNP  CBG: No results for input(s): GLUCAP in the last 168 hours.  Microbiology: Results for orders placed or performed during the hospital encounter of 05/24/17  MRSA PCR Screening     Status: Abnormal   Collection Time: 05/24/17  6:01 AM   Result Value Ref Range Status   MRSA by PCR POSITIVE (A) NEGATIVE Final    Comment:        The GeneXpert MRSA Assay (FDA approved for NASAL specimens only), is one component of a comprehensive MRSA colonization surveillance program. It is not intended to diagnose MRSA infection nor to guide or monitor treatment for MRSA infections. RESULT CALLED TO, READ BACK BY AND VERIFIED WITHAlgis Downs [facility] RN AT [phone] 05/24/17 BY A.DAVIS Performed at [facility], [address] [place], [place] [address]   Fungus culture, blood     Status: None   Collection Time: 05/24/17  6:15 AM  Result Value Ref Range Status   Specimen Description BLOOD RIGHT HAND  Final   Special Requests   Final    BOTTLES DRAWN AEROBIC ONLY Blood Culture results may not be optimal due to an inadequate volume of blood received in culture bottles   Culture   Final    NO GROWTH 7 DAYS NO FUNGUS ISOLATED Performed at [facility], [address] [place], [place] [address]    Report Status 05/31/2017 FINAL  Final  Culture, blood (routine x 2)     Status: None   Collection Time: 05/24/17  6:20 AM  Result Value  Ref Range Status   Specimen Description BLOOD RIGHT HAND  Final   Special Requests   Final    BOTTLES DRAWN AEROBIC ONLY Blood Culture results may not be optimal due to an inadequate volume of blood received in culture bottles   Culture   Final    NO GROWTH 5 DAYS Performed at [facility], [address] [place], [place] [address]    Report Status 06/09/2017 FINAL  Final  Culture, blood (routine x 2)     Status: None   Collection Time: 05/24/17  6:41 AM  Result Value Ref Range Status   Specimen Description BLOOD LEFT ANTECUBITAL  Final   Special Requests   Final    BOTTLES DRAWN AEROBIC ONLY Blood Culture results may not be optimal due to an inadequate volume of blood received in culture bottles   Culture   Final    NO GROWTH 5 DAYS Performed at [facility], [address]  [place], [place] [address]    Report Status 05/20/2017 FINAL  Final  Culture, respiratory (NON-Expectorated)     Status: None   Collection Time: 05/24/17  3:25 PM  Result Value Ref Range Status   Specimen Description TRACHEAL ASPIRATE  Final   Special Requests NONE  Final   Gram Stain   Final    ABUNDANT WBC PRESENT,BOTH PMN AND MONONUCLEAR RARE SQUAMOUS EPITHELIAL CELLS PRESENT RARE GRAM POSITIVE COCCI IN PAIRS IN CHAINS RARE GRAM NEGATIVE COCCOBACILLI    Culture   Final    FEW Consistent with normal respiratory flora. Performed at [facility], [address] [place], [place] [address]    Report Status 05/27/2017 FINAL  Final    Coagulation Studies: No results for input(s): LABPROT, INR in the last 72 hours.  Urinalysis: No results for input(s): COLORURINE, LABSPEC, PHURINE, GLUCOSEU, HGBUR, BILIRUBINUR, KETONESUR, PROTEINUR, UROBILINOGEN, NITRITE, LEUKOCYTESUR in the last 72 hours.  Invalid input(s): APPERANCEUR    Imaging: Dg Abd 1 View  Result Date: 06/15/2017 CLINICAL DATA:  Abdominal distention. EXAM: ABDOMEN - 1 VIEW COMPARISON:  CT abdomen pelvis dated June 12, 2017. Abdominal x-ray dated June 09, 2017. FINDINGS: Unchanged enteric tube coiled in the stomach. A rectal catheter remains in place. The colon remains markedly distended. Unchanged mildly dilated loop of small bowel in the right abdomen. Postsurgical changes noted in the left abdomen. No acute osseous abnormality. IMPRESSION: 1. Unchanged severe colonic ileus. Electronically Signed   By: Obie Dredge M.D.   On: 06/15/2017 09:39   Dg Chest Port 1 View  Result Date: 06/15/2017 CLINICAL DATA:  Respiratory failure EXAM: PORTABLE CHEST 1 VIEW COMPARISON:  06/01/2017 FINDINGS: AICD remains in place with leads both in the right atrium. Tracheostomy tube, right PICC line and NG tube are unchanged. Interval placement of subclavian dialysis catheter with the tip in the upper right atrium. Worsening right lung  airspace disease in the mid and lower lung zones. Decreasing lung volumes. No confluent opacity on the left. No visible effusions. Heart is normal size. IMPRESSION: Interval placement of right subclavian dialysis catheter with the tip in the right atrium. No pneumothorax. Worsening aeration with increasing right lung airspace disease. Electronically Signed   By: Charlett Nose M.D.   On: 06/15/2017 07:28     Medications:     lidocaine (PF)  Assessment/ Plan:  62 y.o. 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   1.  Severe acute renal failure. 2.  Hyponatremia. 3.  Acute respiratory failure. 4.  Anemia unspecified. 5.  Severe protein calorie malnutrition with severe emaciation.  Plan: Renal parameters are worsening again.  BUN currently up to 109 with a creatinine of 2.47.  Patient unable to generate a very high creatinine secondary to very low muscle mass.  We will reinitiate the patient on dialysis on Wednesday.  Overall as before however patient continues to have a very guarded prognosis.  Continue to monitor renal parameters as well as hemoglobin.  Consider blood transfusion for hemoglobin of 7 or less.   LOS: 0 Melvin Kelley 4/29/20193:13 PM

## 2017-06-16 LAB — MAGNESIUM: Magnesium: 2.3 mg/dL (ref 1.7–2.4)

## 2017-06-16 NOTE — Progress Notes (Signed)
Pulmonary Critical Care Medicine [facility] [place]   PULMONARY SERVICE  PROGRESS NOTE  Date of Service: 06/16/2017  Melvin Kelley  OZD:[id number]  DOB: Sep 06, 1955   DOA: 05/19/2017  Referring Physician: Carron Curie, MD  HPI: Melvin Kelley is a 62 y.o. male seen for follow up of Acute on Chronic Respiratory Failure.  Remains on full vent support at this time assist control mode  Medications: Reviewed on Rounds  Physical Exam:  Vitals: Temperature 100.0 pulse 113 respiratory rate 30 blood pressure 117/60 saturations 100%  Ventilator Settings mode of ventilation assist control FiO2 35% tidal volume 465 PEEP 5  . General: Comfortable at this time . Eyes: Grossly normal lids, irises & conjunctiva . ENT: grossly tongue is normal . Neck: no obvious mass . Cardiovascular: S1-S2 normal no gallop or rub . Respiratory: Scattered rhonchi . Abdomen: Soft nondistended . Skin: no rash seen on limited exam . Musculoskeletal: not rigid . Psychiatric:unable to assess . Neurologic: no seizure no involuntary movements         Labs on Admission:  Basic Metabolic Panel: Recent Labs  Lab 06/10/17 0537 06/12/17 0609 06/13/17 0532 06/15/17 0628 06/16/17 0753  NA 132* 128* 126* 123*  --   K 4.6 4.8 4.5 5.6*  --   CL 96* 92* 90* 89*  --   CO2 23 24 26 24   --   GLUCOSE 102* 93 136* 180*  --   BUN 64* 53* 73* 109*  --   CREATININE 1.89* 1.67* 1.97* 2.47*  --   CALCIUM 9.3 9.2 9.1 8.4*  --   MG 2.0  --  2.0 2.2 2.3  PHOS 4.9* 5.8* 6.3* 8.3*  --     Liver Function Tests: Recent Labs  Lab 06/10/17 0537 06/12/17 0609 06/13/17 0532 06/15/17 0628  AST 39  --  46* 45*  ALT 18  --  36 28  ALKPHOS 210*  --  235* 234*  BILITOT 1.0  --  1.4* 1.1  PROT 8.1  --  8.7* 7.8  ALBUMIN 2.8* 2.8* 2.9* 2.4*   No results for input(s): LIPASE, AMYLASE in the last 168 hours. No results for input(s): AMMONIA in the last 168 hours.  CBC: Recent Labs  Lab 06/10/17 0537  06/12/17 0609 06/15/17 0628  WBC 7.8 7.8 6.2  NEUTROABS  --   --  4.8  HGB 8.5* 8.9* 7.9*  HCT 26.8* 27.5* 24.4*  MCV 83.5 83.6 82.7  PLT 155 149* 188    Cardiac Enzymes: Recent Labs  Lab 06/15/17 0628  TROPONINI <0.03    BNP (last 3 results) No results for input(s): BNP in the last 8760 hours.  ProBNP (last 3 results) No results for input(s): PROBNP in the last 8760 hours.  Radiological Exams on Admission: Dg Abd 1 View  Result Date: 06/15/2017 CLINICAL DATA:  Abdominal distention. EXAM: ABDOMEN - 1 VIEW COMPARISON:  CT abdomen pelvis dated June 12, 2017. Abdominal x-ray dated June 09, 2017. FINDINGS: Unchanged enteric tube coiled in the stomach. A rectal catheter remains in place. The colon remains markedly distended. Unchanged mildly dilated loop of small bowel in the right abdomen. Postsurgical changes noted in the left abdomen. No acute osseous abnormality. IMPRESSION: 1. Unchanged severe colonic ileus. Electronically Signed   By: Obie Dredge M.D.   On: 06/15/2017 09:39   Dg Chest Port 1 View  Result Date: 06/15/2017 CLINICAL DATA:  Respiratory failure EXAM: PORTABLE CHEST 1 VIEW COMPARISON:  06/01/2017 FINDINGS: AICD remains in place with leads  both in the right atrium. Tracheostomy tube, right PICC line and NG tube are unchanged. Interval placement of subclavian dialysis catheter with the tip in the upper right atrium. Worsening right lung airspace disease in the mid and lower lung zones. Decreasing lung volumes. No confluent opacity on the left. No visible effusions. Heart is normal size. IMPRESSION: Interval placement of right subclavian dialysis catheter with the tip in the right atrium. No pneumothorax. Worsening aeration with increasing right lung airspace disease. Electronically Signed   By: Charlett Nose M.D.   On: 06/15/2017 07:28   Dg Abd Portable 1v  Result Date: 06/16/2017 CLINICAL DATA:  NG tube placement EXAM: PORTABLE ABDOMEN - 1 VIEW COMPARISON:   06/15/2017 FINDINGS: Enteric tube tip is in the left upper quadrant consistent with location in the upper stomach. Diffuse gas distended: With suggestion of distended small bowel with small bowel fold thickening. Changes likely due to ileus and enteritis. Surgical clips in the upper abdomen. IMPRESSION: Enteric tube tip in the left upper quadrant consistent with location in the upper stomach. No change in prominent gas distended colon with scattered stool distended thick-walled small bowel loops. Electronically Signed   By: Burman Nieves M.D.   On: 06/16/2017 00:19    Assessment/Plan Active Problems:   Cardiac arrest (HCC)   Acute on chronic respiratory failure (HCC)   DNR (do not resuscitate) discussion   AKI (acute kidney injury) (HCC)   CHF (congestive heart failure) (HCC)   Obstructive sleep apnea   Acute kidney injury (HCC)   1. Acute on chronic respiratory failure with hypoxia patient had worsening of status likely related to renal issues will resume the weaning and try him on T collar again. 2. Acute renal failure nephrology following will likely need to be on dialysis again 3. Congestive heart failure fluid overloaded 4. Sleep apnea nonissue 5. Status post cardiac arrest at baseline   I have personally seen and evaluated the patient, evaluated laboratory and imaging results, formulated the assessment and plan and placed orders. The Patient requires high complexity decision making for assessment and support.  Case was discussed on Rounds with the Respiratory Therapy Staff  Yevonne Pax, MD [facility] Pulmonary Critical Care Medicine Sleep Medicine

## 2017-06-17 LAB — CBC
HEMATOCRIT: 23.8 % — AB (ref 39.0–52.0)
Hemoglobin: 7.4 g/dL — ABNORMAL LOW (ref 13.0–17.0)
MCH: 25.8 pg — AB (ref 26.0–34.0)
MCHC: 31.1 g/dL (ref 30.0–36.0)
MCV: 82.9 fL (ref 78.0–100.0)
Platelets: 221 10*3/uL (ref 150–400)
RBC: 2.87 MIL/uL — AB (ref 4.22–5.81)
RDW: 17.7 % — ABNORMAL HIGH (ref 11.5–15.5)
WBC: 6.4 10*3/uL (ref 4.0–10.5)

## 2017-06-17 LAB — RENAL FUNCTION PANEL
Albumin: 2.4 g/dL — ABNORMAL LOW (ref 3.5–5.0)
Anion gap: 12 (ref 5–15)
BUN: 121 mg/dL — ABNORMAL HIGH (ref 6–20)
CHLORIDE: 91 mmol/L — AB (ref 101–111)
CO2: 27 mmol/L (ref 22–32)
Calcium: 9 mg/dL (ref 8.9–10.3)
Creatinine, Ser: 2.39 mg/dL — ABNORMAL HIGH (ref 0.61–1.24)
GFR, EST AFRICAN AMERICAN: 32 mL/min — AB (ref >60)
GFR, EST NON AFRICAN AMERICAN: 28 mL/min — AB (ref >60)
Glucose, Bld: 116 mg/dL — ABNORMAL HIGH (ref 65–99)
POTASSIUM: 5.3 mmol/L — AB (ref 3.5–5.1)
Phosphorus: 7.8 mg/dL — ABNORMAL HIGH (ref 2.5–4.6)
Sodium: 130 mmol/L — ABNORMAL LOW (ref 135–145)

## 2017-06-17 NOTE — Progress Notes (Signed)
[facility]  ROUNDING NOTE   Subjective:  Patient seen at bedside. Remains critically ill. Still on the ventilator. Dialysis to be reinitiated today.   Objective:  Vital signs in last 24 hours:  Temperature 97.9 pulse 98 respirations 24 blood pressure 101/44  Physical Exam: General: Chronically ill-appearing  Head: Severe temporal wasting  Eyes: Anicteric  Neck: Tracheostomy in place  Lungs:  Scattered rhonchi, vent assisted  Heart: S1S2 no rubs  Abdomen:  Soft, nontender, bowel sounds present  Extremities: No peripheral edema.  Neurologic: Not following commands  Skin: No lesions  Access: Right subclavain temporary dialysis catheter    Basic Metabolic Panel: Recent Labs  Lab 06/12/17 0609 06/13/17 0532 06/15/17 0628 06/16/17 0753 06/17/17 0435  NA 128* 126* 123*  --  130*  K 4.8 4.5 5.6*  --  5.3*  CL 92* 90* 89*  --  91*  CO2 24 26 24   --  27  GLUCOSE 93 136* 180*  --  116*  BUN 53* 73* 109*  --  121*  CREATININE 1.67* 1.97* 2.47*  --  2.39*  CALCIUM 9.2 9.1 8.4*  --  9.0  MG  --  2.0 2.2 2.3  --   PHOS 5.8* 6.3* 8.3*  --  7.8*    Liver Function Tests: Recent Labs  Lab 06/12/17 0609 06/13/17 0532 06/15/17 0628 06/17/17 0435  AST  --  46* 45*  --   ALT  --  36 28  --   ALKPHOS  --  235* 234*  --   BILITOT  --  1.4* 1.1  --   PROT  --  8.7* 7.8  --   ALBUMIN 2.8* 2.9* 2.4* 2.4*   No results for input(s): LIPASE, AMYLASE in the last 168 hours. No results for input(s): AMMONIA in the last 168 hours.  CBC: Recent Labs  Lab 06/12/17 0609 06/15/17 0628 06/17/17 0435  WBC 7.8 6.2 6.4  NEUTROABS  --  4.8  --   HGB 8.9* 7.9* 7.4*  HCT 27.5* 24.4* 23.8*  MCV 83.6 82.7 82.9  PLT 149* 188 221    Cardiac Enzymes: Recent Labs  Lab 06/15/17 0628  TROPONINI <0.03    BNP: Invalid input(s): POCBNP  CBG: No results for input(s): GLUCAP in the last 168 hours.  Microbiology: Results for orders placed or performed during the  hospital encounter of 05/24/17  MRSA PCR Screening     Status: Abnormal   Collection Time: 05/24/17  6:01 AM  Result Value Ref Range Status   MRSA by PCR POSITIVE (A) NEGATIVE Final    Comment:        The GeneXpert MRSA Assay (FDA approved for NASAL specimens only), is one component of a comprehensive MRSA colonization surveillance program. It is not intended to diagnose MRSA infection nor to guide or monitor treatment for MRSA infections. RESULT CALLED TO, READ BACK BY AND VERIFIED WITHAlgis Downs [facility] RN AT [phone] 05/24/17 BY A.DAVIS Performed at [facility], [address], [place], [place] [address]   Fungus culture, blood     Status: None   Collection Time: 05/24/17  6:15 AM  Result Value Ref Range Status   Specimen Description BLOOD RIGHT HAND  Final   Special Requests   Final    BOTTLES DRAWN AEROBIC ONLY Blood Culture results may not be optimal due to an inadequate volume of blood received in culture bottles   Culture   Final    NO GROWTH 7 DAYS NO  FUNGUS ISOLATED Performed at [facility], [address], [place], [place] [address]    Report Status 05/31/2017 FINAL  Final  Culture, blood (routine x 2)     Status: None   Collection Time: 05/24/17  6:20 AM  Result Value Ref Range Status   Specimen Description BLOOD RIGHT HAND  Final   Special Requests   Final    BOTTLES DRAWN AEROBIC ONLY Blood Culture results may not be optimal due to an inadequate volume of blood received in culture bottles   Culture   Final    NO GROWTH 5 DAYS Performed at [facility], [address], [place], [place] [address]    Report Status 05/25/2017 FINAL  Final  Culture, blood (routine x 2)     Status: None   Collection Time: 05/24/17  6:41 AM  Result Value Ref Range Status   Specimen Description BLOOD LEFT ANTECUBITAL  Final   Special Requests   Final    BOTTLES DRAWN AEROBIC ONLY Blood Culture results may not be optimal due to an inadequate volume of blood received in  culture bottles   Culture   Final    NO GROWTH 5 DAYS Performed at [facility], [address], [place], [place] [address]    Report Status 06/11/2017 FINAL  Final  Culture, respiratory (NON-Expectorated)     Status: None   Collection Time: 05/24/17  3:25 PM  Result Value Ref Range Status   Specimen Description TRACHEAL ASPIRATE  Final   Special Requests NONE  Final   Gram Stain   Final    ABUNDANT WBC PRESENT,BOTH PMN AND MONONUCLEAR RARE SQUAMOUS EPITHELIAL CELLS PRESENT RARE GRAM POSITIVE COCCI IN PAIRS IN CHAINS RARE GRAM NEGATIVE COCCOBACILLI    Culture   Final    FEW Consistent with normal respiratory flora. Performed at [facility], [address], [place], [place] [address]    Report Status 05/27/2017 FINAL  Final    Coagulation Studies: No results for input(s): LABPROT, INR in the last 72 hours.  Urinalysis: No results for input(s): COLORURINE, LABSPEC, PHURINE, GLUCOSEU, HGBUR, BILIRUBINUR, KETONESUR, PROTEINUR, UROBILINOGEN, NITRITE, LEUKOCYTESUR in the last 72 hours.  Invalid input(s): APPERANCEUR    Imaging: Dg Abd Portable 1v  Result Date: 06/16/2017 CLINICAL DATA:  NG tube placement EXAM: PORTABLE ABDOMEN - 1 VIEW COMPARISON:  06/15/2017 FINDINGS: Enteric tube tip is in the left upper quadrant consistent with location in the upper stomach. Diffuse gas distended: With suggestion of distended small bowel with small bowel fold thickening. Changes likely due to ileus and enteritis. Surgical clips in the upper abdomen. IMPRESSION: Enteric tube tip in the left upper quadrant consistent with location in the upper stomach. No change in prominent gas distended colon with scattered stool distended thick-walled small bowel loops. Electronically Signed   By: Burman Nieves M.D.   On: 06/16/2017 00:19     Medications:     lidocaine (PF)  Assessment/ Plan:  62 y.o. 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   1.  Severe acute renal failure. 2.  Hyponatremia. 3.  Acute respiratory failure. 4.  Anemia unspecified.  5.  Severe protein calorie malnutrition with severe emaciation. 6.  Hyperkalemia.  Plan: Azotemia continues to worsen.  BUN currently up to 121 with a creatinine of 2.39.  Patient is to be restarted on hemodialysis today.  He also has mild hyperkalemia with a serum potassium of 5.3 today.  We will continue to monitor renal parameters as we restart his dialysis.  We will tentatively plan for dialysis again on Friday.  Overall prognosis remains quite guarded.  Otherwise continue supportive care with ventilatory support.   LOS: 0 Melvin Kelley 5/1/20194:42 PM

## 2017-06-17 NOTE — Progress Notes (Signed)
Pulmonary Critical Care Medicine [facility] [place]   PULMONARY SERVICE  PROGRESS NOTE  Date of Service: 06/17/2017  Paras Tellman  DPO:[id number]  DOB: [birth year]   DOA: 06/04/2017  Referring Physician: Carron Curie, MD  HPI: Melvin Kelley is a 62 y.o. male seen for follow up of Acute on Chronic Respiratory Failure.  Patient was having increased agitation and increased respiratory rate was so was placed back on the ventilator right now is on assist control mode  Medications: Reviewed on Rounds  Physical Exam:  Vitals: Temperature 97.7 pulse 98 respiratory rate 24 blood pressure 101/48 saturations 100%  Ventilator Settings mode of ventilation assist control FiO2 35% tidal volume 300 PEEP 5  . General: Comfortable at this time . Eyes: Grossly normal lids, irises & conjunctiva . ENT: grossly tongue is normal . Neck: no obvious mass . Cardiovascular: S1-S2 normal no gallop . Respiratory: No rhonchi noted . Abdomen: Soft distended . Skin: no rash seen on limited exam . Musculoskeletal: not rigid . Psychiatric:unable to assess . Neurologic: no seizure no involuntary movements         Labs on Admission:  Basic Metabolic Panel: Recent Labs  Lab 06/12/17 0609 06/13/17 0532 06/15/17 0628 06/16/17 0753 06/17/17 0435  NA 128* 126* 123*  --  130*  K 4.8 4.5 5.6*  --  5.3*  CL 92* 90* 89*  --  91*  CO2 24 26 24   --  27  GLUCOSE 93 136* 180*  --  116*  BUN 53* 73* 109*  --  121*  CREATININE 1.67* 1.97* 2.47*  --  2.39*  CALCIUM 9.2 9.1 8.4*  --  9.0  MG  --  2.0 2.2 2.3  --   PHOS 5.8* 6.3* 8.3*  --  7.8*    Liver Function Tests: Recent Labs  Lab 06/12/17 0609 06/13/17 0532 06/15/17 0628 06/17/17 0435  AST  --  46* 45*  --   ALT  --  36 28  --   ALKPHOS  --  235* 234*  --   BILITOT  --  1.4* 1.1  --   PROT  --  8.7* 7.8  --   ALBUMIN 2.8* 2.9* 2.4* 2.4*   No results for input(s): LIPASE, AMYLASE in the last 168 hours. No results for input(s):  AMMONIA in the last 168 hours.  CBC: Recent Labs  Lab 06/12/17 0609 06/15/17 0628 06/17/17 0435  WBC 7.8 6.2 6.4  NEUTROABS  --  4.8  --   HGB 8.9* 7.9* 7.4*  HCT 27.5* 24.4* 23.8*  MCV 83.6 82.7 82.9  PLT 149* 188 221    Cardiac Enzymes: Recent Labs  Lab 06/15/17 0628  TROPONINI <0.03    BNP (last 3 results) No results for input(s): BNP in the last 8760 hours.  ProBNP (last 3 results) No results for input(s): PROBNP in the last 8760 hours.  Radiological Exams on Admission: Dg Abd 1 View  Result Date: 06/15/2017 CLINICAL DATA:  Abdominal distention. EXAM: ABDOMEN - 1 VIEW COMPARISON:  CT abdomen pelvis dated June 12, 2017. Abdominal x-ray dated June 09, 2017. FINDINGS: Unchanged enteric tube coiled in the stomach. A rectal catheter remains in place. The colon remains markedly distended. Unchanged mildly dilated loop of small bowel in the right abdomen. Postsurgical changes noted in the left abdomen. No acute osseous abnormality. IMPRESSION: 1. Unchanged severe colonic ileus. Electronically Signed   By: Obie Dredge M.D.   On: 06/15/2017 09:39   Dg Chest [facility]  Result Date: 06/15/2017 CLINICAL DATA:  Respiratory failure EXAM: PORTABLE CHEST 1 VIEW COMPARISON:  06/01/2017 FINDINGS: AICD remains in place with leads both in the right atrium. Tracheostomy tube, right PICC line and NG tube are unchanged. Interval placement of subclavian dialysis catheter with the tip in the upper right atrium. Worsening right lung airspace disease in the mid and lower lung zones. Decreasing lung volumes. No confluent opacity on the left. No visible effusions. Heart is normal size. IMPRESSION: Interval placement of right subclavian dialysis catheter with the tip in the right atrium. No pneumothorax. Worsening aeration with increasing right lung airspace disease. Electronically Signed   By: Charlett Nose M.D.   On: 06/15/2017 07:28   Dg Abd Portable 1v  Result Date: 06/16/2017 CLINICAL DATA:   NG tube placement EXAM: PORTABLE ABDOMEN - 1 VIEW COMPARISON:  06/15/2017 FINDINGS: Enteric tube tip is in the left upper quadrant consistent with location in the upper stomach. Diffuse gas distended: With suggestion of distended small bowel with small bowel fold thickening. Changes likely due to ileus and enteritis. Surgical clips in the upper abdomen. IMPRESSION: Enteric tube tip in the left upper quadrant consistent with location in the upper stomach. No change in prominent gas distended colon with scattered stool distended thick-walled small bowel loops. Electronically Signed   By: Burman Nieves M.D.   On: 06/16/2017 00:19    Assessment/Plan Active Problems:   Cardiac arrest (HCC)   Acute on chronic respiratory failure (HCC)   DNR (do not resuscitate) discussion   AKI (acute kidney injury) (HCC)   CHF (congestive heart failure) (HCC)   Obstructive sleep apnea   Acute kidney injury (HCC)   1. Acute on chronic respiratory failure with hypoxia patient right now is on assist control we will continue with full vent support letter and rest for the day reassess and start weaning again. 2. Acute kidney injury nephrology is following dialysis 3. Congestive heart failure clinically stable we will continue to follow 4. Obstructive sleep apnea nonissue at this time 5. Status post cardiac arrest still has periodic encephalopathy   I have personally seen and evaluated the patient, evaluated laboratory and imaging results, formulated the assessment and plan and placed orders. The Patient requires high complexity decision making for assessment and support.  Case was discussed on Rounds with the Respiratory Therapy Staff  Yevonne Pax, MD [facility] Pulmonary Critical Care Medicine Sleep Medicine

## 2017-06-17 DEATH — deceased

## 2017-06-18 NOTE — Progress Notes (Signed)
Pulmonary Critical Care Medicine [facility] [place]   PULMONARY SERVICE  PROGRESS NOTE  Date of Service: 06/18/2017  Melvin Kelley  YIF:[id number]  DOB: December 17, 1955   DOA: Jun 04, 2017  Referring Physician: Carron Curie, MD  HPI: Melvin Kelley is a 62 y.o. male seen for follow up of Acute on Chronic Respiratory Failure.  Currently is on pressure support wean he has been pulling himself off of the ventilator numerous times.  Once again reiterated to him not to pull himself off the as poses a significant danger to him.  Medications: Reviewed on Rounds  Physical Exam:  Vitals: Temperature 99.1 pulse 106 respiratory rate 26 blood pressure is 105/58 saturations 98%  Ventilator Settings mode of ventilation pressure support FiO2 35% tidal volume 400 pressure support 12 PEEP 5  . General: Comfortable at this time . Eyes: Grossly normal lids, irises & conjunctiva . ENT: grossly tongue is normal . Neck: no obvious mass . Cardiovascular: S1-S2 normal no gallop or rub . Respiratory: No rhonchi at this time . Abdomen: Soft nondistended . Skin: no rash seen on limited exam . Musculoskeletal: not rigid . Psychiatric:unable to assess . Neurologic: no seizure no involuntary movements         Labs on Admission:  Basic Metabolic Panel: Recent Labs  Lab 06/12/17 0609 06/13/17 0532 06/15/17 0628 06/16/17 0753 06/17/17 0435  NA 128* 126* 123*  --  130*  K 4.8 4.5 5.6*  --  5.3*  CL 92* 90* 89*  --  91*  CO2 24 26 24   --  27  GLUCOSE 93 136* 180*  --  116*  BUN 53* 73* 109*  --  121*  CREATININE 1.67* 1.97* 2.47*  --  2.39*  CALCIUM 9.2 9.1 8.4*  --  9.0  MG  --  2.0 2.2 2.3  --   PHOS 5.8* 6.3* 8.3*  --  7.8*    Liver Function Tests: Recent Labs  Lab 06/12/17 0609 06/13/17 0532 06/15/17 0628 06/17/17 0435  AST  --  46* 45*  --   ALT  --  36 28  --   ALKPHOS  --  235* 234*  --   BILITOT  --  1.4* 1.1  --   PROT  --  8.7* 7.8  --   ALBUMIN 2.8* 2.9* 2.4* 2.4*    No results for input(s): LIPASE, AMYLASE in the last 168 hours. No results for input(s): AMMONIA in the last 168 hours.  CBC: Recent Labs  Lab 06/12/17 0609 06/15/17 0628 06/17/17 0435  WBC 7.8 6.2 6.4  NEUTROABS  --  4.8  --   HGB 8.9* 7.9* 7.4*  HCT 27.5* 24.4* 23.8*  MCV 83.6 82.7 82.9  PLT 149* 188 221    Cardiac Enzymes: Recent Labs  Lab 06/15/17 0628  TROPONINI <0.03    BNP (last 3 results) No results for input(s): BNP in the last 8760 hours.  ProBNP (last 3 results) No results for input(s): PROBNP in the last 8760 hours.  Radiological Exams on Admission: Dg Abd 1 View  Result Date: 06/15/2017 CLINICAL DATA:  Abdominal distention. EXAM: ABDOMEN - 1 VIEW COMPARISON:  CT abdomen pelvis dated June 12, 2017. Abdominal x-ray dated June 09, 2017. FINDINGS: Unchanged enteric tube coiled in the stomach. A rectal catheter remains in place. The colon remains markedly distended. Unchanged mildly dilated loop of small bowel in the right abdomen. Postsurgical changes noted in the left abdomen. No acute osseous abnormality. IMPRESSION: 1. Unchanged severe colonic ileus. Electronically  Signed   By: Obie Dredge M.D.   On: 06/15/2017 09:39   Dg Chest Port 1 View  Result Date: 06/15/2017 CLINICAL DATA:  Respiratory failure EXAM: PORTABLE CHEST 1 VIEW COMPARISON:  06/01/2017 FINDINGS: AICD remains in place with leads both in the right atrium. Tracheostomy tube, right PICC line and NG tube are unchanged. Interval placement of subclavian dialysis catheter with the tip in the upper right atrium. Worsening right lung airspace disease in the mid and lower lung zones. Decreasing lung volumes. No confluent opacity on the left. No visible effusions. Heart is normal size. IMPRESSION: Interval placement of right subclavian dialysis catheter with the tip in the right atrium. No pneumothorax. Worsening aeration with increasing right lung airspace disease. Electronically Signed   By: Charlett Nose M.D.   On: 06/15/2017 07:28   Dg Abd Portable 1v  Result Date: 06/16/2017 CLINICAL DATA:  NG tube placement EXAM: PORTABLE ABDOMEN - 1 VIEW COMPARISON:  06/15/2017 FINDINGS: Enteric tube tip is in the left upper quadrant consistent with location in the upper stomach. Diffuse gas distended: With suggestion of distended small bowel with small bowel fold thickening. Changes likely due to ileus and enteritis. Surgical clips in the upper abdomen. IMPRESSION: Enteric tube tip in the left upper quadrant consistent with location in the upper stomach. No change in prominent gas distended colon with scattered stool distended thick-walled small bowel loops. Electronically Signed   By: Burman Nieves M.D.   On: 06/16/2017 00:19    Assessment/Plan Active Problems:   Cardiac arrest (HCC)   Acute on chronic respiratory failure (HCC)   DNR (do not resuscitate) discussion   AKI (acute kidney injury) (HCC)   CHF (congestive heart failure) (HCC)   Obstructive sleep apnea   Acute kidney injury (HCC)   1. Acute on chronic respiratory failure with hypoxia we will continue with weaning on pressure support mode currently is on 35% oxygen will continue to advance as tolerated.  Continue pulmonary toilet supportive care 2. Congestive heart failure at baseline we will continue with supportive care 3. Acute renal failure following with nephrology and dialysis 4. Status post cardiac arrest at baseline right now 5. Sleep apnea nonissue   I have personally seen and evaluated the patient, evaluated laboratory and imaging results, formulated the assessment and plan and placed orders. The Patient requires high complexity decision making for assessment and support.  Case was discussed on Rounds with the Respiratory Therapy Staff  Yevonne Pax, MD [facility] Pulmonary Critical Care Medicine Sleep Medicine

## 2017-06-19 LAB — RENAL FUNCTION PANEL
ALBUMIN: 2.4 g/dL — AB (ref 3.5–5.0)
ANION GAP: 10 (ref 5–15)
BUN: 74 mg/dL — ABNORMAL HIGH (ref 6–20)
CALCIUM: 9.4 mg/dL (ref 8.9–10.3)
CO2: 28 mmol/L (ref 22–32)
CREATININE: 1.8 mg/dL — AB (ref 0.61–1.24)
Chloride: 96 mmol/L — ABNORMAL LOW (ref 101–111)
GFR, EST AFRICAN AMERICAN: 45 mL/min — AB (ref >60)
GFR, EST NON AFRICAN AMERICAN: 39 mL/min — AB (ref >60)
Glucose, Bld: 83 mg/dL (ref 65–99)
PHOSPHORUS: 4.2 mg/dL (ref 2.5–4.6)
Potassium: 4.4 mmol/L (ref 3.5–5.1)
SODIUM: 134 mmol/L — AB (ref 135–145)

## 2017-06-19 LAB — CBC
HCT: 23.5 % — ABNORMAL LOW (ref 39.0–52.0)
HEMOGLOBIN: 7.5 g/dL — AB (ref 13.0–17.0)
MCH: 26.7 pg (ref 26.0–34.0)
MCHC: 31.9 g/dL (ref 30.0–36.0)
MCV: 83.6 fL (ref 78.0–100.0)
PLATELETS: 172 10*3/uL (ref 150–400)
RBC: 2.81 MIL/uL — AB (ref 4.22–5.81)
RDW: 18.2 % — ABNORMAL HIGH (ref 11.5–15.5)
WBC: 8.3 10*3/uL (ref 4.0–10.5)

## 2017-06-19 LAB — TRIGLYCERIDES: TRIGLYCERIDES: 86 mg/dL (ref <150)

## 2017-06-19 LAB — MAGNESIUM: MAGNESIUM: 2.1 mg/dL (ref 1.7–2.4)

## 2017-06-19 NOTE — Progress Notes (Signed)
[facility]  ROUNDING NOTE   Subjective:  Patient remains critically ill. He will be due for hemodialysis again today. He is much more awake and alert today. When asked how he is feeling today he states bad.  Objective:  Vital signs in last 24 hours:  Temperature 98 pulse 1 2 respirations 22 blood pressure 98/60  Physical Exam: General: Chronically ill-appearing  Head: Severe temporal wasting  Eyes: Anicteric  Neck: Tracheostomy in place  Lungs:  Scattered rhonchi, vent assisted  Heart: S1S2 no rubs  Abdomen:  Soft, nontender, bowel sounds present  Extremities: No peripheral edema.  Neurologic: Awake, alert, will follow simple commands  Skin: No lesions  Access: Right subclavain temporary dialysis catheter    Basic Metabolic Panel: Recent Labs  Lab 06/13/17 0532 06/15/17 0628 06/16/17 0753 06/17/17 0435  NA 126* 123*  --  130*  K 4.5 5.6*  --  5.3*  CL 90* 89*  --  91*  CO2 26 24  --  27  GLUCOSE 136* 180*  --  116*  BUN 73* 109*  --  121*  CREATININE 1.97* 2.47*  --  2.39*  CALCIUM 9.1 8.4*  --  9.0  MG 2.0 2.2 2.3  --   PHOS 6.3* 8.3*  --  7.8*    Liver Function Tests: Recent Labs  Lab 06/13/17 0532 06/15/17 0628 06/17/17 0435  AST 46* 45*  --   ALT 36 28  --   ALKPHOS 235* 234*  --   BILITOT 1.4* 1.1  --   PROT 8.7* 7.8  --   ALBUMIN 2.9* 2.4* 2.4*   No results for input(s): LIPASE, AMYLASE in the last 168 hours. No results for input(s): AMMONIA in the last 168 hours.  CBC: Recent Labs  Lab 06/15/17 0628 06/17/17 0435 06/19/17 0700  WBC 6.2 6.4 8.3  NEUTROABS 4.8  --   --   HGB 7.9* 7.4* 7.5*  HCT 24.4* 23.8* 23.5*  MCV 82.7 82.9 83.6  PLT 188 221 172    Cardiac Enzymes: Recent Labs  Lab 06/15/17 0628  TROPONINI <0.03    BNP: Invalid input(s): POCBNP  CBG: No results for input(s): GLUCAP in the last 168 hours.  Microbiology: Results for orders placed or performed during the hospital encounter of 05/24/17  MRSA  PCR Screening     Status: Abnormal   Collection Time: 05/24/17  6:01 AM  Result Value Ref Range Status   MRSA by PCR POSITIVE (A) NEGATIVE Final    Comment:        The GeneXpert MRSA Assay (FDA approved for NASAL specimens only), is one component of a comprehensive MRSA colonization surveillance program. It is not intended to diagnose MRSA infection nor to guide or monitor treatment for MRSA infections. RESULT CALLED TO, READ BACK BY AND VERIFIED WITHAlgis Downs [facility] RN AT [phone] 05/24/17 BY A.DAVIS Performed at [facility] Lab, [address], [place], [place] [address]   Fungus culture, blood     Status: None   Collection Time: 05/24/17  6:15 AM  Result Value Ref Range Status   Specimen Description BLOOD RIGHT HAND  Final   Special Requests   Final    BOTTLES DRAWN AEROBIC ONLY Blood Culture results may not be optimal due to an inadequate volume of blood received in culture bottles   Culture   Final    NO GROWTH 7 DAYS NO FUNGUS ISOLATED Performed at [facility] Lab, [address] [place], [place] [address]  Report Status 05/31/2017 FINAL  Final  Culture, blood (routine x 2)     Status: None   Collection Time: 05/24/17  6:20 AM  Result Value Ref Range Status   Specimen Description BLOOD RIGHT HAND  Final   Special Requests   Final    BOTTLES DRAWN AEROBIC ONLY Blood Culture results may not be optimal due to an inadequate volume of blood received in culture bottles   Culture   Final    NO GROWTH 5 DAYS Performed at [facility] Lab, [address] [place], [place] [address]    Report Status 06/08/2017 FINAL  Final  Culture, blood (routine x 2)     Status: None   Collection Time: 05/24/17  6:41 AM  Result Value Ref Range Status   Specimen Description BLOOD LEFT ANTECUBITAL  Final   Special Requests   Final    BOTTLES DRAWN AEROBIC ONLY Blood Culture results may not be optimal due to an inadequate volume of blood received in culture bottles   Culture   Final    NO  GROWTH 5 DAYS Performed at [facility] Lab, [address], [place], [place] [address]    Report Status 06/08/2017 FINAL  Final  Culture, respiratory (NON-Expectorated)     Status: None   Collection Time: 05/24/17  3:25 PM  Result Value Ref Range Status   Specimen Description TRACHEAL ASPIRATE  Final   Special Requests NONE  Final   Gram Stain   Final    ABUNDANT WBC PRESENT,BOTH PMN AND MONONUCLEAR RARE SQUAMOUS EPITHELIAL CELLS PRESENT RARE GRAM POSITIVE COCCI IN PAIRS IN CHAINS RARE GRAM NEGATIVE COCCOBACILLI    Culture   Final    FEW Consistent with normal respiratory flora. Performed at [facility] Lab, [address], [place], [place] [address]    Report Status 05/27/2017 FINAL  Final    Coagulation Studies: No results for input(s): LABPROT, INR in the last 72 hours.  Urinalysis: No results for input(s): COLORURINE, LABSPEC, PHURINE, GLUCOSEU, HGBUR, BILIRUBINUR, KETONESUR, PROTEINUR, UROBILINOGEN, NITRITE, LEUKOCYTESUR in the last 72 hours.  Invalid input(s): APPERANCEUR    Imaging: No results found.   Medications:     lidocaine (PF)  Assessment/ Plan:  62 y.o. 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   1.  Severe acute renal failure. 2.  Hyponatremia. 3.  Acute respiratory failure. 4.  Anemia unspecified. 5.  Severe protein calorie malnutrition with severe emaciation. 6.  Hyperkalemia.  Plan: Patient has been reinitiated on hemodialysis and appears to be tolerating well thus far.  We will plan  for another dialysis session today.  Thereafter we will plan for dialysis on Monday.  His mental status has improved after initiation of dialysis.  Continue to monitor serum electrolytes.  Also monitor CBC as most recent hemoglobin was 7.5.  Nutritional support as per primary team.   LOS: 0 Janasia Coverdale 5/3/20198:37 AM

## 2017-06-19 NOTE — Progress Notes (Signed)
Pulmonary Critical Care Medicine [facility] [place]   PULMONARY SERVICE  PROGRESS NOTE  Date of Service: 06/19/2017  Melvin Kelley  ZDG:[id number]  DOB: October 09, 1955   DOA: 05/18/2017  Referring Physician: Carron Curie, MD  HPI: Melvin Kelley is a 62 y.o. male seen for follow up of Acute on Chronic Respiratory Failure.  Currently patient is in full support is on assist control mode patient is on 35% oxygen no distress is noted.  Medications: Reviewed on Rounds  Physical Exam:  Vitals: Temperature 98.0 pulse 102 respiratory rate 22 blood pressure 90/60 saturation 98%  Ventilator Settings mode of ventilation assist control FiO2 35% tidal volume 450 PEEP 5  . General: Comfortable at this time . Eyes: Grossly normal lids, irises & conjunctiva . ENT: grossly tongue is normal . Neck: no obvious mass . Cardiovascular: S1-S2 normal no gallop . Respiratory: Scattered rhonchi expansion is equal . Abdomen: Soft nontender . Skin: no rash seen on limited exam . Musculoskeletal: not rigid . Psychiatric:unable to assess . Neurologic: no seizure no involuntary movements         Labs on Admission:  Basic Metabolic Panel: Recent Labs  Lab 06/13/17 0532 06/15/17 0628 06/16/17 0753 06/17/17 0435 06/19/17 0700  NA 126* 123*  --  130* 134*  K 4.5 5.6*  --  5.3* 4.4  CL 90* 89*  --  91* 96*  CO2 26 24  --  27 28  GLUCOSE 136* 180*  --  116* 83  BUN 73* 109*  --  121* 74*  CREATININE 1.97* 2.47*  --  2.39* 1.80*  CALCIUM 9.1 8.4*  --  9.0 9.4  MG 2.0 2.2 2.3  --  2.1  PHOS 6.3* 8.3*  --  7.8* 4.2    Liver Function Tests: Recent Labs  Lab 06/13/17 0532 06/15/17 0628 06/17/17 0435 06/19/17 0700  AST 46* 45*  --   --   ALT 36 28  --   --   ALKPHOS 235* 234*  --   --   BILITOT 1.4* 1.1  --   --   PROT 8.7* 7.8  --   --   ALBUMIN 2.9* 2.4* 2.4* 2.4*   No results for input(s): LIPASE, AMYLASE in the last 168 hours. No results for input(s): AMMONIA in the last 168  hours.  CBC: Recent Labs  Lab 06/15/17 0628 06/17/17 0435 06/19/17 0700  WBC 6.2 6.4 8.3  NEUTROABS 4.8  --   --   HGB 7.9* 7.4* 7.5*  HCT 24.4* 23.8* 23.5*  MCV 82.7 82.9 83.6  PLT 188 221 172    Cardiac Enzymes: Recent Labs  Lab 06/15/17 0628  TROPONINI <0.03    BNP (last 3 results) No results for input(s): BNP in the last 8760 hours.  ProBNP (last 3 results) No results for input(s): PROBNP in the last 8760 hours.  Radiological Exams on Admission: Dg Abd Portable 1v  Result Date: 06/16/2017 CLINICAL DATA:  NG tube placement EXAM: PORTABLE ABDOMEN - 1 VIEW COMPARISON:  06/15/2017 FINDINGS: Enteric tube tip is in the left upper quadrant consistent with location in the upper stomach. Diffuse gas distended: With suggestion of distended small bowel with small bowel fold thickening. Changes likely due to ileus and enteritis. Surgical clips in the upper abdomen. IMPRESSION: Enteric tube tip in the left upper quadrant consistent with location in the upper stomach. No change in prominent gas distended colon with scattered stool distended thick-walled small bowel loops. Electronically Signed   By: Chrissie Noa  Andria Meuse M.D.   On: 06/16/2017 00:19    Assessment/Plan Active Problems:   Cardiac arrest (HCC)   Acute on chronic respiratory failure (HCC)   DNR (do not resuscitate) discussion   AKI (acute kidney injury) (HCC)   CHF (congestive heart failure) (HCC)   Obstructive sleep apnea   Acute kidney injury (HCC)   1. Acute on chronic respiratory failure with hypoxia respiratory therapy will reassess the our SBI and try to start weaning again.  Patient had been doing T collar last week and I think we should be able to proceed further. 2. Acute kidney injury followed by nephrology patient will be on dialysis 3. Chronic congestive heart failure combined right now it is stable we will continue to follow 4. Status post cardiac arrest 5. Sleep apnea nonissue   I have personally seen  and evaluated the patient, evaluated laboratory and imaging results, formulated the assessment and plan and placed orders. The Patient requires high complexity decision making for assessment and support.  Case was discussed on Rounds with the Respiratory Therapy Staff  Yevonne Pax, MD [facility] Pulmonary Critical Care Medicine Sleep Medicine

## 2017-06-21 LAB — CBC
HEMATOCRIT: 23.7 % — AB (ref 39.0–52.0)
Hemoglobin: 7.5 g/dL — ABNORMAL LOW (ref 13.0–17.0)
MCH: 25.7 pg — ABNORMAL LOW (ref 26.0–34.0)
MCHC: 31.6 g/dL (ref 30.0–36.0)
MCV: 81.2 fL (ref 78.0–100.0)
Platelets: 226 10*3/uL (ref 150–400)
RBC: 2.92 MIL/uL — AB (ref 4.22–5.81)
RDW: 17.7 % — ABNORMAL HIGH (ref 11.5–15.5)
WBC: 10 10*3/uL (ref 4.0–10.5)

## 2017-06-21 LAB — BASIC METABOLIC PANEL
ANION GAP: 12 (ref 5–15)
BUN: 80 mg/dL — ABNORMAL HIGH (ref 6–20)
CO2: 25 mmol/L (ref 22–32)
Calcium: 9.1 mg/dL (ref 8.9–10.3)
Chloride: 93 mmol/L — ABNORMAL LOW (ref 101–111)
Creatinine, Ser: 1.86 mg/dL — ABNORMAL HIGH (ref 0.61–1.24)
GFR calc non Af Amer: 38 mL/min — ABNORMAL LOW (ref >60)
GFR, EST AFRICAN AMERICAN: 44 mL/min — AB (ref >60)
GLUCOSE: 146 mg/dL — AB (ref 65–99)
Potassium: 5.3 mmol/L — ABNORMAL HIGH (ref 3.5–5.1)
Sodium: 130 mmol/L — ABNORMAL LOW (ref 135–145)

## 2017-06-21 NOTE — Progress Notes (Signed)
Pulmonary Critical Care Medicine [facility] [place]   PULMONARY SERVICE  PROGRESS NOTE  Date of Service: 06/21/2017  Melvin Kelley  KWI:[id number]  DOB: 06-03-1955   DOA: 05/23/2017  Referring Physician: Carron Curie, MD  HPI: Melvin Kelley is a 62 y.o. male seen for follow up of Acute on Chronic Respiratory Failure.  Currently on T collar as a size 6 trach in place right now.  Seems to be doing better.  Right now is on 20% oxygen.  Medications: Reviewed on Rounds  Physical Exam:  Vitals: Temperature 97.3 pulse 93 respiratory rate 20 blood pressure 125/67 saturations 97%  Ventilator Settings on T collar 28% FiO2  . General: Comfortable at this time . Eyes: Grossly normal lids, irises & conjunctiva . ENT: grossly tongue is normal . Neck: no obvious mass . Cardiovascular: S1-S2 normal no gallop or rub . Respiratory: No rhonchi noted . Abdomen: Soft nontender . Skin: no rash seen on limited exam . Musculoskeletal: not rigid . Psychiatric:unable to assess . Neurologic: no seizure no involuntary movements         Labs on Admission:  Basic Metabolic Panel: Recent Labs  Lab 06/15/17 0628 06/16/17 0753 06/17/17 0435 06/19/17 0700 06/21/17 0644  NA 123*  --  130* 134* 130*  K 5.6*  --  5.3* 4.4 5.3*  CL 89*  --  91* 96* 93*  CO2 24  --  27 28 25   GLUCOSE 180*  --  116* 83 146*  BUN 109*  --  121* 74* 80*  CREATININE 2.47*  --  2.39* 1.80* 1.86*  CALCIUM 8.4*  --  9.0 9.4 9.1  MG 2.2 2.3  --  2.1  --   PHOS 8.3*  --  7.8* 4.2  --     Liver Function Tests: Recent Labs  Lab 06/15/17 0628 06/17/17 0435 06/19/17 0700  AST 45*  --   --   ALT 28  --   --   ALKPHOS 234*  --   --   BILITOT 1.1  --   --   PROT 7.8  --   --   ALBUMIN 2.4* 2.4* 2.4*   No results for input(s): LIPASE, AMYLASE in the last 168 hours. No results for input(s): AMMONIA in the last 168 hours.  CBC: Recent Labs  Lab 06/15/17 0628 06/17/17 0435 06/19/17 0700  06/21/17 0644  WBC 6.2 6.4 8.3 10.0  NEUTROABS 4.8  --   --   --   HGB 7.9* 7.4* 7.5* 7.5*  HCT 24.4* 23.8* 23.5* 23.7*  MCV 82.7 82.9 83.6 81.2  PLT 188 221 172 226    Cardiac Enzymes: Recent Labs  Lab 06/15/17 0628  TROPONINI <0.03    BNP (last 3 results) No results for input(s): BNP in the last 8760 hours.  ProBNP (last 3 results) No results for input(s): PROBNP in the last 8760 hours.  Radiological Exams on Admission: No results found.  Assessment/Plan Active Problems:   Cardiac arrest (HCC)   Acute on chronic respiratory failure (HCC)   DNR (do not resuscitate) discussion   AKI (acute kidney injury) (HCC)   CHF (congestive heart failure) (HCC)   Obstructive sleep apnea   Acute kidney injury (HCC)   1. Acute on chronic respiratory failure with hypoxia we will continue with her/T collar.  We will continue with present oxygen settings.  Titrate oxygen as tolerated 2. Acute kidney injury follow labs patient's been on dialysis 3. Acute on chronic congestive heart failure as per  nephrology recommendations with fluid status. 4. Status post cardiac arrest grossly unchanged 5. Sleep apnea nonissue   I have personally seen and evaluated the patient, evaluated laboratory and imaging results, formulated the assessment and plan and placed orders. The Patient requires high complexity decision making for assessment and support.  Case was discussed on Rounds with the Respiratory Therapy Staff  Yevonne Pax, MD [facility] Pulmonary Critical Care Medicine Sleep Medicine

## 2017-06-22 LAB — RENAL FUNCTION PANEL
ANION GAP: 10 (ref 5–15)
Albumin: 2.5 g/dL — ABNORMAL LOW (ref 3.5–5.0)
BUN: 85 mg/dL — ABNORMAL HIGH (ref 6–20)
CALCIUM: 8.8 mg/dL — AB (ref 8.9–10.3)
CHLORIDE: 94 mmol/L — AB (ref 101–111)
CO2: 25 mmol/L (ref 22–32)
Creatinine, Ser: 1.88 mg/dL — ABNORMAL HIGH (ref 0.61–1.24)
GFR calc Af Amer: 43 mL/min — ABNORMAL LOW (ref >60)
GFR, EST NON AFRICAN AMERICAN: 37 mL/min — AB (ref >60)
Glucose, Bld: 105 mg/dL — ABNORMAL HIGH (ref 65–99)
Phosphorus: 7.2 mg/dL — ABNORMAL HIGH (ref 2.5–4.6)
Potassium: 4.7 mmol/L (ref 3.5–5.1)
Sodium: 129 mmol/L — ABNORMAL LOW (ref 135–145)

## 2017-06-22 LAB — CBC
HCT: 25.4 % — ABNORMAL LOW (ref 39.0–52.0)
Hemoglobin: 8 g/dL — ABNORMAL LOW (ref 13.0–17.0)
MCH: 25.8 pg — AB (ref 26.0–34.0)
MCHC: 31.5 g/dL (ref 30.0–36.0)
MCV: 81.9 fL (ref 78.0–100.0)
PLATELETS: 320 10*3/uL (ref 150–400)
RBC: 3.1 MIL/uL — ABNORMAL LOW (ref 4.22–5.81)
RDW: 18.3 % — AB (ref 11.5–15.5)
WBC: 10.7 10*3/uL — ABNORMAL HIGH (ref 4.0–10.5)

## 2017-06-22 LAB — MAGNESIUM: MAGNESIUM: 2.1 mg/dL (ref 1.7–2.4)

## 2017-06-22 NOTE — Progress Notes (Signed)
[facility]  ROUNDING NOTE   Subjective:  Patient seen at bedside. Due for dialysis again today.   Objective:  Vital signs in last 24 hours:  Temperature 97.9 pulse 87 respirations 28 blood pressure 105/62  Physical Exam: General: Chronically ill-appearing  Head: Severe temporal wasting, NG in place  Eyes: Anicteric  Neck: Tracheostomy in place  Lungs:  Scattered rhonchi, vent assisted  Heart: S1S2 no rubs  Abdomen:  Soft, nontender, bowel sounds present  Extremities: No peripheral edema.  Neurologic: Awake, alert, will follow simple commands  Skin: No lesions  Access: Right subclavain temporary dialysis catheter    Basic Metabolic Panel: Recent Labs  Lab 06/16/17 0753  06/17/17 0435 06/19/17 0700 06/21/17 0644 06/22/17 0504  NA  --   --  130* 134* 130* 129*  K  --   --  5.3* 4.4 5.3* 4.7  CL  --   --  91* 96* 93* 94*  CO2  --   --  27 28 25 25   GLUCOSE  --   --  116* 83 146* 105*  BUN  --   --  121* 74* 80* 85*  CREATININE  --   --  2.39* 1.80* 1.86* 1.88*  CALCIUM  --    < > 9.0 9.4 9.1 8.8*  MG 2.3  --   --  2.1  --  2.1  PHOS  --   --  7.8* 4.2  --  7.2*   < > = values in this interval not displayed.    Liver Function Tests: Recent Labs  Lab 06/17/17 0435 06/19/17 0700 06/22/17 0504  ALBUMIN 2.4* 2.4* 2.5*   No results for input(s): LIPASE, AMYLASE in the last 168 hours. No results for input(s): AMMONIA in the last 168 hours.  CBC: Recent Labs  Lab 06/17/17 0435 06/19/17 0700 06/21/17 0644 06/22/17 0504  WBC 6.4 8.3 10.0 10.7*  HGB 7.4* 7.5* 7.5* 8.0*  HCT 23.8* 23.5* 23.7* 25.4*  MCV 82.9 83.6 81.2 81.9  PLT 221 172 226 320    Cardiac Enzymes: No results for input(s): CKTOTAL, CKMB, CKMBINDEX, TROPONINI in the last 168 hours.  BNP: Invalid input(s): POCBNP  CBG: No results for input(s): GLUCAP in the last 168 hours.  Microbiology: Results for orders placed or performed during the hospital encounter of 05/24/17  MRSA  PCR Screening     Status: Abnormal   Collection Time: 05/24/17  6:01 AM  Result Value Ref Range Status   MRSA by PCR POSITIVE (A) NEGATIVE Final    Comment:        The GeneXpert MRSA Assay (FDA approved for NASAL specimens only), is one component of a comprehensive MRSA colonization surveillance program. It is not intended to diagnose MRSA infection nor to guide or monitor treatment for MRSA infections. RESULT CALLED TO, READ BACK BY AND VERIFIED WITHAlgis Downs [facility] RN AT [phone] 05/24/17 BY A.DAVIS Performed at [facility] Lab, [address] [place], [place] [address]   Fungus culture, blood     Status: None   Collection Time: 05/24/17  6:15 AM  Result Value Ref Range Status   Specimen Description BLOOD RIGHT HAND  Final   Special Requests   Final    BOTTLES DRAWN AEROBIC ONLY Blood Culture results may not be optimal due to an inadequate volume of blood received in culture bottles   Culture   Final    NO GROWTH 7 DAYS NO FUNGUS ISOLATED Performed at [facility] Lab, [address] [place], [place] [address]    Report Status 05/31/2017 FINAL  Final  Culture, blood (routine x 2)     Status: None   Collection Time: 05/24/17  6:20 AM  Result Value Ref Range Status   Specimen Description BLOOD RIGHT HAND  Final   Special Requests   Final    BOTTLES DRAWN AEROBIC ONLY Blood Culture results may not be optimal due to an inadequate volume of blood received in culture bottles   Culture   Final    NO GROWTH 5 DAYS Performed at [facility] Lab, [address], [place], [place] [address]    Report Status 06/06/2017 FINAL  Final  Culture, blood (routine x 2)     Status: None   Collection Time: 05/24/17  6:41 AM  Result Value Ref Range Status   Specimen Description BLOOD LEFT ANTECUBITAL  Final   Special Requests   Final    BOTTLES DRAWN AEROBIC ONLY Blood Culture results may not be optimal due to an inadequate volume of blood received in culture bottles   Culture   Final    NO  GROWTH 5 DAYS Performed at [facility] Lab, [address], [place], [place] [address]    Report Status 05/30/2017 FINAL  Final  Culture, respiratory (NON-Expectorated)     Status: None   Collection Time: 05/24/17  3:25 PM  Result Value Ref Range Status   Specimen Description TRACHEAL ASPIRATE  Final   Special Requests NONE  Final   Gram Stain   Final    ABUNDANT WBC PRESENT,BOTH PMN AND MONONUCLEAR RARE SQUAMOUS EPITHELIAL CELLS PRESENT RARE GRAM POSITIVE COCCI IN PAIRS IN CHAINS RARE GRAM NEGATIVE COCCOBACILLI    Culture   Final    FEW Consistent with normal respiratory flora. Performed at [facility] Lab, [address], [place], [place] [address]    Report Status 05/27/2017 FINAL  Final    Coagulation Studies: No results for input(s): LABPROT, INR in the last 72 hours.  Urinalysis: No results for input(s): COLORURINE, LABSPEC, PHURINE, GLUCOSEU, HGBUR, BILIRUBINUR, KETONESUR, PROTEINUR, UROBILINOGEN, NITRITE, LEUKOCYTESUR in the last 72 hours.  Invalid input(s): APPERANCEUR    Imaging: No results found.   Medications:     lidocaine (PF)  Assessment/ Plan:  62 y.o. 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   1.  Severe acute renal failure. 2.  Hyponatremia. 3.  Acute respiratory failure. 4.  Anemia unspecified. 5.  Severe protein calorie malnutrition with severe emaciation. 6.  Hyperkalemia.  Plan: Patient due for dialysis again today.  Orders have been prepared.  He remains hyponatremic with serum  sodium of 129.  He remains on TPN.  Serum potassium has corrected and is currently 4.7.  Hemoglobin up to 8.0.  Serum phosphorus was 4.2 earlier but up to 7.2 today.  This should improve with ongoing dialysis.  Overall prognosis remains quite guarded however.   LOS: 0 Maryella Abood 5/6/20193:10 PM

## 2017-06-22 NOTE — Progress Notes (Signed)
Pulmonary Critical Care Medicine [facility] [place]   PULMONARY SERVICE  PROGRESS NOTE  Date of Service: 06/22/2017  Melvin Kelley  GYB:[id number]  DOB: February 17, 1956   DOA: 06-11-2017  Referring Physician: Carron Curie, MD  HPI: Melvin Kelley is a 62 y.o. male seen for follow up of Acute on Chronic Respiratory Failure.  Currently patient is on T collar has been on for now doing fairly well  Medications: Reviewed on Rounds  Physical Exam:  Vitals: Temperature 97.8 pulse 97 respiratory rate 27 blood pressure 03/13/1964 saturation 90%  Ventilator Settings S/T collar FiO2 28%  . General: Comfortable at this time . Eyes: Grossly normal lids, irises & conjunctiva . ENT: grossly tongue is normal . Neck: no obvious mass . Cardiovascular: S1-S2 normal no gallop or rub . Respiratory: No rhonchi expansion is equal . Abdomen: Soft nontender . Skin: no rash seen on limited exam . Musculoskeletal: not rigid . Psychiatric:unable to assess . Neurologic: no seizure no involuntary movements         Labs on Admission:  Basic Metabolic Panel: Recent Labs  Lab 06/16/17 0753 06/17/17 0435 06/19/17 0700 06/21/17 0644 06/22/17 0504  NA  --  130* 134* 130* 129*  K  --  5.3* 4.4 5.3* 4.7  CL  --  91* 96* 93* 94*  CO2  --  27 28 25 25   GLUCOSE  --  116* 83 146* 105*  BUN  --  121* 74* 80* 85*  CREATININE  --  2.39* 1.80* 1.86* 1.88*  CALCIUM  --  9.0 9.4 9.1 8.8*  MG 2.3  --  2.1  --  2.1  PHOS  --  7.8* 4.2  --  7.2*    Liver Function Tests: Recent Labs  Lab 06/17/17 0435 06/19/17 0700 06/22/17 0504  ALBUMIN 2.4* 2.4* 2.5*   No results for input(s): LIPASE, AMYLASE in the last 168 hours. No results for input(s): AMMONIA in the last 168 hours.  CBC: Recent Labs  Lab 06/17/17 0435 06/19/17 0700 06/21/17 0644 06/22/17 0504  WBC 6.4 8.3 10.0 10.7*  HGB 7.4* 7.5* 7.5* 8.0*  HCT 23.8* 23.5* 23.7* 25.4*  MCV 82.9 83.6 81.2 81.9  PLT 221 172 226 320     Cardiac Enzymes: No results for input(s): CKTOTAL, CKMB, CKMBINDEX, TROPONINI in the last 168 hours.  BNP (last 3 results) No results for input(s): BNP in the last 8760 hours.  ProBNP (last 3 results) No results for input(s): PROBNP in the last 8760 hours.  Radiological Exams on Admission: No results found.  Assessment/Plan Active Problems:   Cardiac arrest (HCC)   Acute on chronic respiratory failure (HCC)   DNR (do not resuscitate) discussion   AKI (acute kidney injury) (HCC)   CHF (congestive heart failure) (HCC)   Obstructive sleep apnea   Acute kidney injury (HCC)   1. Acute on chronic respiratory failure with hypoxia we will continue with weaning on aerosolized T collar continue supportive care monitor fluid status 2. Acute kidney injury on dialysis being followed by nephrology will continue to follow 3. Acute systolic heart failure will continue with present management and fluid status 4. Sleep apnea at baseline 5. Status post cardiac arrest at baseline   I have personally seen and evaluated the patient, evaluated laboratory and imaging results, formulated the assessment and plan and placed orders. The Patient requires high complexity decision making for assessment and support.  Case was discussed on Rounds with the Respiratory Therapy Staff  Yevonne Pax, MD  [facility] Pulmonary Critical Care Medicine Sleep Medicine

## 2017-06-23 NOTE — Progress Notes (Signed)
Pulmonary Critical Care Medicine [facility] [place]   PULMONARY SERVICE  PROGRESS NOTE  Date of Service: 06/23/2017  Melvin Kelley  APO:[id number]  DOB: 1955/08/26   DOA: 06/10/2017  Referring Physician: Carron Curie, MD  HPI: Melvin Kelley is a 62 y.o. male seen for follow up of Acute on Chronic Respiratory Failure.  Patient is weaning on T collar appears to be his new baseline.  Has been off the ventilator for more than 72 hours  Medications: Reviewed on Rounds  Physical Exam:  Vitals: Temperature 97.9 pulse 92 respiratory rate 21 blood pressure 121/71 saturations 9100%  Ventilator Settings aerosolized T collar FiO2 20%  . General: Comfortable at this time . Eyes: Grossly normal lids, irises & conjunctiva . ENT: grossly tongue is normal . Neck: no obvious mass . Cardiovascular: S1-S2 normal no gallop or rub . Respiratory: No rhonchi at this time . Abdomen: Soft nontender . Skin: no rash seen on limited exam . Musculoskeletal: not rigid . Psychiatric:unable to assess . Neurologic: no seizure no involuntary movements         Labs on Admission:  Basic Metabolic Panel: Recent Labs  Lab 06/17/17 0435 06/19/17 0700 06/21/17 0644 06/22/17 0504  NA 130* 134* 130* 129*  K 5.3* 4.4 5.3* 4.7  CL 91* 96* 93* 94*  CO2 27 28 25 25   GLUCOSE 116* 83 146* 105*  BUN 121* 74* 80* 85*  CREATININE 2.39* 1.80* 1.86* 1.88*  CALCIUM 9.0 9.4 9.1 8.8*  MG  --  2.1  --  2.1  PHOS 7.8* 4.2  --  7.2*    Liver Function Tests: Recent Labs  Lab 06/17/17 0435 06/19/17 0700 06/22/17 0504  ALBUMIN 2.4* 2.4* 2.5*   No results for input(s): LIPASE, AMYLASE in the last 168 hours. No results for input(s): AMMONIA in the last 168 hours.  CBC: Recent Labs  Lab 06/17/17 0435 06/19/17 0700 06/21/17 0644 06/22/17 0504  WBC 6.4 8.3 10.0 10.7*  HGB 7.4* 7.5* 7.5* 8.0*  HCT 23.8* 23.5* 23.7* 25.4*  MCV 82.9 83.6 81.2 81.9  PLT 221 172 226 320    Cardiac Enzymes: No  results for input(s): CKTOTAL, CKMB, CKMBINDEX, TROPONINI in the last 168 hours.  BNP (last 3 results) No results for input(s): BNP in the last 8760 hours.  ProBNP (last 3 results) No results for input(s): PROBNP in the last 8760 hours.  Radiological Exams on Admission: No results found.  Assessment/Plan Active Problems:   Cardiac arrest (HCC)   Acute on chronic respiratory failure (HCC)   DNR (do not resuscitate) discussion   AKI (acute kidney injury) (HCC)   CHF (congestive heart failure) (HCC)   Obstructive sleep apnea   Acute kidney injury (HCC)   1. Acute on chronic respiratory failure with hypoxia continues on T collar so far tolerating one-point titrate oxygen continue pulmonary toilet continue with secretion management 2. Acute on chronic congestive heart failure we will continue with supportive care patient's being dialyzed for his fluid status 3. Acute kidney injury followed by nephrology continue with dialysis 4. Obstructive sleep apnea not an issue right now   I have personally seen and evaluated the patient, evaluated laboratory and imaging results, formulated the assessment and plan and placed orders. The Patient requires high complexity decision making for assessment and support.  Case was discussed on Rounds with the Respiratory Therapy Staff  Yevonne Pax, MD [facility] Pulmonary Critical Care Medicine Sleep Medicine

## 2017-06-24 ENCOUNTER — Other Ambulatory Visit (HOSPITAL_COMMUNITY): Payer: Self-pay

## 2017-06-24 LAB — CBC
HCT: 25.7 % — ABNORMAL LOW (ref 39.0–52.0)
HEMATOCRIT: 23.7 % — AB (ref 39.0–52.0)
Hemoglobin: 8 g/dL — ABNORMAL LOW (ref 13.0–17.0)
Hemoglobin: 7.4 g/dL — ABNORMAL LOW (ref 13.0–17.0)
MCH: 25.8 pg — AB (ref 26.0–34.0)
MCH: 25.8 pg — ABNORMAL LOW (ref 26.0–34.0)
MCHC: 31.1 g/dL (ref 30.0–36.0)
MCHC: 31.2 g/dL (ref 30.0–36.0)
MCV: 82.9 fL (ref 78.0–100.0)
MCV: 82.6 fL (ref 78.0–100.0)
Platelets: 235 10*3/uL (ref 150–400)
Platelets: 307 10*3/uL (ref 150–400)
RBC: 3.1 MIL/uL — ABNORMAL LOW (ref 4.22–5.81)
RBC: 2.87 MIL/uL — ABNORMAL LOW (ref 4.22–5.81)
RDW: 18.5 % — AB (ref 11.5–15.5)
RDW: 18.2 % — AB (ref 11.5–15.5)
WBC: 13.2 10*3/uL — ABNORMAL HIGH (ref 4.0–10.5)
WBC: 14.3 10*3/uL — AB (ref 4.0–10.5)

## 2017-06-24 LAB — BLOOD GAS, ARTERIAL
ACID-BASE EXCESS: 2.2 mmol/L — AB (ref 0.0–2.0)
BICARBONATE: 27.1 mmol/L (ref 20.0–28.0)
DRAWN BY: 270221
FIO2: 0.85
O2 SAT: 96.5 %
PATIENT TEMPERATURE: 98.6
PO2 ART: 87.1 mmHg (ref 83.0–108.0)
pCO2 arterial: 48.3 mmHg — ABNORMAL HIGH (ref 32.0–48.0)
pH, Arterial: 7.367 (ref 7.350–7.450)

## 2017-06-24 LAB — RENAL FUNCTION PANEL
Albumin: 2.5 g/dL — ABNORMAL LOW (ref 3.5–5.0)
Anion gap: 9 (ref 5–15)
BUN: 57 mg/dL — AB (ref 6–20)
CALCIUM: 9.2 mg/dL (ref 8.9–10.3)
CO2: 25 mmol/L (ref 22–32)
Chloride: 97 mmol/L — ABNORMAL LOW (ref 101–111)
Creatinine, Ser: 1.55 mg/dL — ABNORMAL HIGH (ref 0.61–1.24)
GFR calc Af Amer: 54 mL/min — ABNORMAL LOW (ref >60)
GFR calc non Af Amer: 47 mL/min — ABNORMAL LOW (ref >60)
Glucose, Bld: 159 mg/dL — ABNORMAL HIGH (ref 65–99)
PHOSPHORUS: 4.6 mg/dL (ref 2.5–4.6)
POTASSIUM: 4.7 mmol/L (ref 3.5–5.1)
Sodium: 131 mmol/L — ABNORMAL LOW (ref 135–145)

## 2017-06-24 NOTE — Progress Notes (Signed)
[facility]  ROUNDING NOTE   Subjective:  Patient due for hemodialysis later today. Currently off of the ventilator.   Objective:  Vital signs in last 24 hours:  98 pulse 94 respirations 26 blood pressure 115/71  Physical Exam: General: Chronically ill-appearing  Head: Severe temporal wasting, NG in place  Eyes: Anicteric  Neck: Tracheostomy in place  Lungs:  Scattered rhonchi, normal effort  Heart: S1S2 no rubs  Abdomen:  Soft, nontender, bowel sounds present  Extremities: No peripheral edema.  Neurologic: Awake, alert, will follow simple commands  Skin: No lesions  Access: Right subclavain temporary dialysis catheter    Basic Metabolic Panel: Recent Labs  Lab 06/19/17 0700 06/21/17 0644 06/22/17 0504 06/24/17 0647  NA 134* 130* 129* 131*  K 4.4 5.3* 4.7 4.7  CL 96* 93* 94* 97*  CO2 28 25 25 25   GLUCOSE 83 146* 105* 159*  BUN 74* 80* 85* 57*  CREATININE 1.80* 1.86* 1.88* 1.55*  CALCIUM 9.4 9.1 8.8* 9.2  MG 2.1  --  2.1  --   PHOS 4.2  --  7.2* 4.6    Liver Function Tests: Recent Labs  Lab 06/19/17 0700 06/22/17 0504 06/24/17 0647  ALBUMIN 2.4* 2.5* 2.5*   No results for input(s): LIPASE, AMYLASE in the last 168 hours. No results for input(s): AMMONIA in the last 168 hours.  CBC: Recent Labs  Lab 06/19/17 0700 06/21/17 0644 06/22/17 0504 06/24/17 0647  WBC 8.3 10.0 10.7* 13.2*  HGB 7.5* 7.5* 8.0* 8.0*  HCT 23.5* 23.7* 25.4* 25.7*  MCV 83.6 81.2 81.9 82.9  PLT 172 226 320 235    Cardiac Enzymes: No results for input(s): CKTOTAL, CKMB, CKMBINDEX, TROPONINI in the last 168 hours.  BNP: Invalid input(s): POCBNP  CBG: No results for input(s): GLUCAP in the last 168 hours.  Microbiology: Results for orders placed or performed during the hospital encounter of 05/24/17  MRSA PCR Screening     Status: Abnormal   Collection Time: 05/24/17  6:01 AM  Result Value Ref Range Status   MRSA by PCR POSITIVE (A) NEGATIVE Final   Comment:        The GeneXpert MRSA Assay (FDA approved for NASAL specimens only), is one component of a comprehensive MRSA colonization surveillance program. It is not intended to diagnose MRSA infection nor to guide or monitor treatment for MRSA infections. RESULT CALLED TO, READ BACK BY AND VERIFIED WITHAlgis Downs [facility] RN AT [phone] 05/24/17 BY A.DAVIS Performed at [facility], [address], [place], [place] [address]   Fungus culture, blood     Status: None   Collection Time: 05/24/17  6:15 AM  Result Value Ref Range Status   Specimen Description BLOOD RIGHT HAND  Final   Special Requests   Final    BOTTLES DRAWN AEROBIC ONLY Blood Culture results may not be optimal due to an inadequate volume of blood received in culture bottles   Culture   Final    NO GROWTH 7 DAYS NO FUNGUS ISOLATED Performed at [facility], [address], [place], [place] [address]    Report Status 05/31/2017 FINAL  Final  Culture, blood (routine x 2)     Status: None   Collection Time: 05/24/17  6:20 AM  Result Value Ref Range Status   Specimen Description BLOOD RIGHT HAND  Final   Special Requests   Final    BOTTLES DRAWN AEROBIC ONLY Blood Culture results may not be optimal due to an  inadequate volume of blood received in culture bottles   Culture   Final    NO GROWTH 5 DAYS Performed at [facility], [address], [place], [place] [address]    Report Status 05/31/2017 FINAL  Final  Culture, blood (routine x 2)     Status: None   Collection Time: 05/24/17  6:41 AM  Result Value Ref Range Status   Specimen Description BLOOD LEFT ANTECUBITAL  Final   Special Requests   Final    BOTTLES DRAWN AEROBIC ONLY Blood Culture results may not be optimal due to an inadequate volume of blood received in culture bottles   Culture   Final    NO GROWTH 5 DAYS Performed at [facility], [address], [place], [place] [address]    Report Status 06/08/2017 FINAL  Final  Culture,  respiratory (NON-Expectorated)     Status: None   Collection Time: 05/24/17  3:25 PM  Result Value Ref Range Status   Specimen Description TRACHEAL ASPIRATE  Final   Special Requests NONE  Final   Gram Stain   Final    ABUNDANT WBC PRESENT,BOTH PMN AND MONONUCLEAR RARE SQUAMOUS EPITHELIAL CELLS PRESENT RARE GRAM POSITIVE COCCI IN PAIRS IN CHAINS RARE GRAM NEGATIVE COCCOBACILLI    Culture   Final    FEW Consistent with normal respiratory flora. Performed at [facility], [address] [place], [place] [address]    Report Status 05/27/2017 FINAL  Final    Coagulation Studies: No results for input(s): LABPROT, INR in the last 72 hours.  Urinalysis: No results for input(s): COLORURINE, LABSPEC, PHURINE, GLUCOSEU, HGBUR, BILIRUBINUR, KETONESUR, PROTEINUR, UROBILINOGEN, NITRITE, LEUKOCYTESUR in the last 72 hours.  Invalid input(s): APPERANCEUR    Imaging: No results found.   Medications:     lidocaine (PF)  Assessment/ Plan:  62 y.o. 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   1.  Severe acute renal failure. 2.  Hyponatremia. 3.  Acute respiratory failure. 4.  Anemia unspecified. 5.  Severe protein calorie malnutrition with severe emaciation. 6.  Hyperkalemia.  Plan: Patient has very little muscle mass.  His creatinine is reflected by this.  BUN currently 57 with a creatinine of 1.5.  Serum phosphorus has now normalized to 4.6.  We will plan for dialysis later today and then on Friday as well.  Hyponatremia  improved slightly with a serum sodium of 131.  In regards to acute respiratory failure patient currently off of the ventilator and appears to be tolerating this well.  He continues to have fairly severe malnutrition with albumin of 2.5 for which he remains on TPN.  Hyperkalemia also resolved with a serum potassium of 4.7.   LOS: 0 Kamden Reber 5/8/20192:35 PM

## 2017-06-24 NOTE — Progress Notes (Signed)
Pulmonary Critical Care Medicine [facility] [place]   PULMONARY SERVICE  PROGRESS NOTE  Date of Service: 06/24/2017  Melvin Kelley  CLE:[id number]  DOB: 1955/10/26   DOA: 05/22/2017  Referring Physician: Carron Curie, MD  HPI: Melvin Kelley is a 62 y.o. male seen for follow up of Acute on Chronic Respiratory Failure.  Patient has been tolerating the PMV doing fairly well.  Has been off the ventilator on T collar good saturations are noted.  Medications: Reviewed on Rounds  Physical Exam:  Vitals: Temperature 97.4 pulse 103 respiratory rate 12 blood pressure 155/80 saturations 98%  Ventilator Settings currently is on T collar FiO2 28%  . General: Comfortable at this time . Eyes: Grossly normal lids, irises & conjunctiva . ENT: grossly tongue is normal . Neck: no obvious mass . Cardiovascular: S1-S2 normal no gallop or rub . Respiratory: No rhonchi expansion equal . Abdomen: Soft and nontender . Skin: no rash seen on limited exam . Musculoskeletal: not rigid . Psychiatric:unable to assess . Neurologic: no seizure no involuntary movements         Labs on Admission:  Basic Metabolic Panel: Recent Labs  Lab 06/19/17 0700 06/21/17 0644 06/22/17 0504 06/24/17 0647  NA 134* 130* 129* 131*  K 4.4 5.3* 4.7 4.7  CL 96* 93* 94* 97*  CO2 28 25 25 25   GLUCOSE 83 146* 105* 159*  BUN 74* 80* 85* 57*  CREATININE 1.80* 1.86* 1.88* 1.55*  CALCIUM 9.4 9.1 8.8* 9.2  MG 2.1  --  2.1  --   PHOS 4.2  --  7.2* 4.6    Liver Function Tests: Recent Labs  Lab 06/19/17 0700 06/22/17 0504 06/24/17 0647  ALBUMIN 2.4* 2.5* 2.5*   No results for input(s): LIPASE, AMYLASE in the last 168 hours. No results for input(s): AMMONIA in the last 168 hours.  CBC: Recent Labs  Lab 06/19/17 0700 06/21/17 0644 06/22/17 0504 06/24/17 0647  WBC 8.3 10.0 10.7* 13.2*  HGB 7.5* 7.5* 8.0* 8.0*  HCT 23.5* 23.7* 25.4* 25.7*  MCV 83.6 81.2 81.9 82.9  PLT 172 226 320 235     Cardiac Enzymes: No results for input(s): CKTOTAL, CKMB, CKMBINDEX, TROPONINI in the last 168 hours.  BNP (last 3 results) No results for input(s): BNP in the last 8760 hours.  ProBNP (last 3 results) No results for input(s): PROBNP in the last 8760 hours.  Radiological Exams on Admission: No results found.  Assessment/Plan Active Problems:   Cardiac arrest (HCC)   Acute on chronic respiratory failure (HCC)   DNR (do not resuscitate) discussion   AKI (acute kidney injury) (HCC)   CHF (congestive heart failure) (HCC)   Obstructive sleep apnea   Acute kidney injury (HCC)   1. Acute on chronic respiratory failure with hypoxemia continue with T collar weaning as tolerated.  Continue aggressive pulmonary toilet supportive care 2. Acute renal failure patient is being followed by nephrology for dialysis will continue with supportive care 3. Chronic congestive heart failure patient is stable at this time we will continue to follow 4. Sleep apnea patient has a trach   I have personally seen and evaluated the patient, evaluated laboratory and imaging results, formulated the assessment and plan and placed orders. The Patient requires high complexity decision making for assessment and support.  Case was discussed on Rounds with the Respiratory Therapy Staff  Yevonne Pax, MD [facility] Pulmonary Critical Care Medicine Sleep Medicine

## 2017-06-25 LAB — PREPARE RBC (CROSSMATCH)

## 2017-06-25 LAB — POTASSIUM: POTASSIUM: 5.2 mmol/L — AB (ref 3.5–5.1)

## 2017-06-25 LAB — CBC
HEMATOCRIT: 22.4 % — AB (ref 39.0–52.0)
Hemoglobin: 6.9 g/dL — CL (ref 13.0–17.0)
MCH: 26.3 pg (ref 26.0–34.0)
MCHC: 30.8 g/dL (ref 30.0–36.0)
MCV: 85.5 fL (ref 78.0–100.0)
PLATELETS: 150 10*3/uL (ref 150–400)
RBC: 2.62 MIL/uL — ABNORMAL LOW (ref 4.22–5.81)
RDW: 18.9 % — AB (ref 11.5–15.5)
WBC: 16.4 10*3/uL — AB (ref 4.0–10.5)

## 2017-06-25 LAB — PROTIME-INR
INR: 1.15
PROTHROMBIN TIME: 14.6 s (ref 11.4–15.2)

## 2017-06-25 LAB — MAGNESIUM: MAGNESIUM: 1.9 mg/dL (ref 1.7–2.4)

## 2017-06-25 NOTE — Progress Notes (Signed)
Pulmonary Critical Care Medicine [facility] [place]   PULMONARY SERVICE  PROGRESS NOTE  Date of Service: 06/25/2017  Josefina Raffetto  LID:[id number]  DOB: 1956/01/17   DOA: 06/12/2017  Referring Physician: Carron Curie, MD  HPI: Qaadir Quam is a 62 y.o. male seen for follow up of Acute on Chronic Respiratory Failure.  Remains on pressure support mode at this time per minute and 45% oxygen good saturations are noted  Medications: Reviewed on Rounds  Physical Exam:  Vitals: Temperature 97.0 pulse 106 respiratory rate 20 blood pressure 109/64 saturations 100%  Ventilator Settings patient is back on the ventilator on pressure support mode  . General: Comfortable at this time . Eyes: Grossly normal lids, irises & conjunctiva . ENT: grossly tongue is normal . Neck: no obvious mass . Cardiovascular: S1-S2 normal no gallop or rub . Respiratory: Scattered rhonchi expansion is equal . Abdomen: Soft and nontender . Skin: no rash seen on limited exam . Musculoskeletal: not rigid . Psychiatric:unable to assess . Neurologic: no seizure no involuntary movements         Labs on Admission:  Basic Metabolic Panel: Recent Labs  Lab 06/19/17 0700 06/21/17 0644 06/22/17 0504 06/24/17 0647 06/25/17 0643  NA 134* 130* 129* 131*  --   K 4.4 5.3* 4.7 4.7 5.2*  CL 96* 93* 94* 97*  --   CO2 28 25 25 25   --   GLUCOSE 83 146* 105* 159*  --   BUN 74* 80* 85* 57*  --   CREATININE 1.80* 1.86* 1.88* 1.55*  --   CALCIUM 9.4 9.1 8.8* 9.2  --   MG 2.1  --  2.1  --  1.9  PHOS 4.2  --  7.2* 4.6  --     Liver Function Tests: Recent Labs  Lab 06/19/17 0700 06/22/17 0504 06/24/17 0647  ALBUMIN 2.4* 2.5* 2.5*   No results for input(s): LIPASE, AMYLASE in the last 168 hours. No results for input(s): AMMONIA in the last 168 hours.  CBC: Recent Labs  Lab 06/21/17 0644 06/22/17 0504 06/24/17 0647 06/24/17 1740 06/25/17 0643  WBC 10.0 10.7* 13.2* 14.3* 16.4*  HGB 7.5* 8.0*  8.0* 7.4* 6.9*  HCT 23.7* 25.4* 25.7* 23.7* 22.4*  MCV 81.2 81.9 82.9 82.6 85.5  PLT 226 320 235 307 150    Cardiac Enzymes: No results for input(s): CKTOTAL, CKMB, CKMBINDEX, TROPONINI in the last 168 hours.  BNP (last 3 results) No results for input(s): BNP in the last 8760 hours.  ProBNP (last 3 results) No results for input(s): PROBNP in the last 8760 hours.  Radiological Exams on Admission: Dg Abd 1 View  Result Date: 06/24/2017 CLINICAL DATA:  Ileus. EXAM: ABDOMEN - 1 VIEW COMPARISON:  06/15/2017 FINDINGS: Nasogastric tube in the left upper abdomen. Postsurgical changes in the abdomen. There continues to be a large amount of bowel gas throughout the abdomen and most compatible with colonic dilatation. There may be small bowel gas in the mid abdomen and lateral right abdomen. Limited evaluation for free air on this supine image. IMPRESSION: Persistent dilated loops of colon containing gas. Findings are similar to the previous examination and suggestive for a colonic ileus. Minimal change from the previous examination. Nasogastric tube appears to be in the stomach. Electronically Signed   By: Richarda Overlie M.D.   On: 06/24/2017 20:49   Dg Chest Port 1 View  Result Date: 06/24/2017 CLINICAL DATA:  Elevated white count. Congestive heart failure. Ileus (HCC) K56.7 (ICD-10-CM) EXAM: PORTABLE CHEST  1 VIEW COMPARISON:  06/15/2017. FINDINGS: Support tubes and apparatus remains stable. There is clearing of RIGHT lung infiltrate. Marked gaseous distention is redemonstrated despite nasogastric tube appearing in appropriate location. No pneumothorax. IMPRESSION: Chronic changes as described.  Apparent improved aeration. Gaseous distension, consistent with the stated diagnosis of ileus. Electronically Signed   By: Elsie Stain M.D.   On: 06/24/2017 20:40    Assessment/Plan Active Problems:   Cardiac arrest (HCC)   Acute on chronic respiratory failure (HCC)   DNR (do not resuscitate) discussion    AKI (acute kidney injury) (HCC)   CHF (congestive heart failure) (HCC)   Obstructive sleep apnea   Acute kidney injury (HCC)   1. Acute on chronic respiratory failure with hypoxia patient right now is back on the ventilator because of desaturations.  Right now patient is on pressure support FiO2 45% tidal volume 485 pressure support 12/5 2. Acute kidney injury followed by nephrology for dialysis in the morning 3. Chronic congestive heart failure we will continue with supportive care diuresis tolerated with dialysis 4. Sleep apnea nonissue 5. post cardiac arrest   I have personally seen and evaluated the patient, evaluated laboratory and imaging results, formulated the assessment and plan and placed orders. The Patient requires high complexity decision making for assessment and support.  Case was discussed on Rounds with the Respiratory Therapy Staff  Yevonne Pax, MD [facility] Pulmonary Critical Care Medicine Sleep Medicine

## 2017-06-26 LAB — RENAL FUNCTION PANEL
ALBUMIN: 2.4 g/dL — AB (ref 3.5–5.0)
ANION GAP: 12 (ref 5–15)
BUN: 60 mg/dL — ABNORMAL HIGH (ref 6–20)
CALCIUM: 8.9 mg/dL (ref 8.9–10.3)
CO2: 24 mmol/L (ref 22–32)
Chloride: 96 mmol/L — ABNORMAL LOW (ref 101–111)
Creatinine, Ser: 1.78 mg/dL — ABNORMAL HIGH (ref 0.61–1.24)
GFR calc Af Amer: 46 mL/min — ABNORMAL LOW (ref >60)
GFR calc non Af Amer: 40 mL/min — ABNORMAL LOW (ref >60)
GLUCOSE: 98 mg/dL (ref 65–99)
PHOSPHORUS: 5.7 mg/dL — AB (ref 2.5–4.6)
POTASSIUM: 5 mmol/L (ref 3.5–5.1)
SODIUM: 132 mmol/L — AB (ref 135–145)

## 2017-06-26 LAB — CBC
HEMATOCRIT: 27.6 % — AB (ref 39.0–52.0)
HEMOGLOBIN: 8.7 g/dL — AB (ref 13.0–17.0)
MCH: 27.3 pg (ref 26.0–34.0)
MCHC: 31.5 g/dL (ref 30.0–36.0)
MCV: 86.5 fL (ref 78.0–100.0)
Platelets: 192 10*3/uL (ref 150–400)
RBC: 3.19 MIL/uL — ABNORMAL LOW (ref 4.22–5.81)
RDW: 18.1 % — ABNORMAL HIGH (ref 11.5–15.5)
WBC: 11.8 10*3/uL — AB (ref 4.0–10.5)

## 2017-06-26 LAB — TYPE AND SCREEN
ABO/RH(D): B POS
ANTIBODY SCREEN: NEGATIVE
UNIT DIVISION: 0

## 2017-06-26 LAB — BPAM RBC
Blood Product Expiration Date: 201906012359
ISSUE DATE / TIME: 201905091243
UNIT TYPE AND RH: 7300

## 2017-06-26 LAB — TRIGLYCERIDES: Triglycerides: 71 mg/dL (ref <150)

## 2017-06-26 LAB — TSH: TSH: 1.423 u[IU]/mL (ref 0.350–4.500)

## 2017-06-26 NOTE — Progress Notes (Signed)
[facility]  ROUNDING NOTE   Subjective:  Patient seen and evaluated during hemodialysis today. He appears to be tolerating well. Creatinine remains low however we suspect that this is secondary to low muscle mass. BUN remains high.   Objective:  Vital signs in last 24 hours:  Temperature 98.2 pulse 104 respirations 28 blood pressure 107/47  Physical Exam: General: Chronically ill-appearing  Head: Severe temporal wasting, NG in place  Eyes: Anicteric  Neck: Tracheostomy in place  Lungs:  Scattered rhonchi, normal effort  Heart: S1S2 no rubs  Abdomen:  Soft, nontender, bowel sounds present  Extremities: No peripheral edema.  Neurologic: Awake, alert, will follow simple commands  Skin: No lesions  Access: Right subclavain temporary dialysis catheter    Basic Metabolic Panel: Recent Labs  Lab 06/21/17 0644 06/22/17 0504 06/24/17 0647 06/25/17 0643 06/26/17 0627  NA 130* 129* 131*  --  132*  K 5.3* 4.7 4.7 5.2* 5.0  CL 93* 94* 97*  --  96*  CO2 25 25 25   --  24  GLUCOSE 146* 105* 159*  --  98  BUN 80* 85* 57*  --  60*  CREATININE 1.86* 1.88* 1.55*  --  1.78*  CALCIUM 9.1 8.8* 9.2  --  8.9  MG  --  2.1  --  1.9  --   PHOS  --  7.2* 4.6  --  5.7*    Liver Function Tests: Recent Labs  Lab 06/22/17 0504 06/24/17 0647 06/26/17 0627  ALBUMIN 2.5* 2.5* 2.4*   No results for input(s): LIPASE, AMYLASE in the last 168 hours. No results for input(s): AMMONIA in the last 168 hours.  CBC: Recent Labs  Lab 06/22/17 0504 06/24/17 0647 06/24/17 1740 06/25/17 0643 06/26/17 0627  WBC 10.7* 13.2* 14.3* 16.4* 11.8*  HGB 8.0* 8.0* 7.4* 6.9* 8.7*  HCT 25.4* 25.7* 23.7* 22.4* 27.6*  MCV 81.9 82.9 82.6 85.5 86.5  PLT 320 235 307 150 192    Cardiac Enzymes: No results for input(s): CKTOTAL, CKMB, CKMBINDEX, TROPONINI in the last 168 hours.  BNP: Invalid input(s): POCBNP  CBG: No results for input(s): GLUCAP in the last 168  hours.  Microbiology: Results for orders placed or performed during the hospital encounter of 05/24/17  MRSA PCR Screening     Status: Abnormal   Collection Time: 05/24/17  6:01 AM  Result Value Ref Range Status   MRSA by PCR POSITIVE (A) NEGATIVE Final    Comment:        The GeneXpert MRSA Assay (FDA approved for NASAL specimens only), is one component of a comprehensive MRSA colonization surveillance program. It is not intended to diagnose MRSA infection nor to guide or monitor treatment for MRSA infections. RESULT CALLED TO, READ BACK BY AND VERIFIED WITHAlgis Downs [facility] RN AT [phone] 05/24/17 BY A.DAVIS Performed at [facility], [address] [place], [place] [address]   Fungus culture, blood     Status: None   Collection Time: 05/24/17  6:15 AM  Result Value Ref Range Status   Specimen Description BLOOD RIGHT HAND  Final   Special Requests   Final    BOTTLES DRAWN AEROBIC ONLY Blood Culture results may not be optimal due to an inadequate volume of blood received in culture bottles   Culture   Final    NO GROWTH 7 DAYS NO FUNGUS ISOLATED Performed at [facility], [address] [place], [place] [address]    Report Status 05/31/2017 FINAL  Final  Culture, blood (routine x 2)     Status: None   Collection Time: 05/24/17  6:20 AM  Result Value Ref Range Status   Specimen Description BLOOD RIGHT HAND  Final   Special Requests   Final    BOTTLES DRAWN AEROBIC ONLY Blood Culture results may not be optimal due to an inadequate volume of blood received in culture bottles   Culture   Final    NO GROWTH 5 DAYS Performed at [facility], [address], [place], [place] [address]    Report Status 2017/06/13 FINAL  Final  Culture, blood (routine x 2)     Status: None   Collection Time: 05/24/17  6:41 AM  Result Value Ref Range Status   Specimen Description BLOOD LEFT ANTECUBITAL  Final   Special Requests   Final    BOTTLES DRAWN AEROBIC ONLY Blood Culture results  may not be optimal due to an inadequate volume of blood received in culture bottles   Culture   Final    NO GROWTH 5 DAYS Performed at [facility], [address], [place], [place] [address]    Report Status 06/13/2017 FINAL  Final  Culture, respiratory (NON-Expectorated)     Status: None   Collection Time: 05/24/17  3:25 PM  Result Value Ref Range Status   Specimen Description TRACHEAL ASPIRATE  Final   Special Requests NONE  Final   Gram Stain   Final    ABUNDANT WBC PRESENT,BOTH PMN AND MONONUCLEAR RARE SQUAMOUS EPITHELIAL CELLS PRESENT RARE GRAM POSITIVE COCCI IN PAIRS IN CHAINS RARE GRAM NEGATIVE COCCOBACILLI    Culture   Final    FEW Consistent with normal respiratory flora. Performed at [facility], [address], [place], [place] [address]    Report Status 05/27/2017 FINAL  Final    Coagulation Studies: Recent Labs    06/25/17 0643  LABPROT 14.6  INR 1.15    Urinalysis: No results for input(s): COLORURINE, LABSPEC, PHURINE, GLUCOSEU, HGBUR, BILIRUBINUR, KETONESUR, PROTEINUR, UROBILINOGEN, NITRITE, LEUKOCYTESUR in the last 72 hours.  Invalid input(s): APPERANCEUR    Imaging: Dg Abd 1 View  Result Date: 06/24/2017 CLINICAL DATA:  Ileus. EXAM: ABDOMEN - 1 VIEW COMPARISON:  06/15/2017 FINDINGS: Nasogastric tube in the left upper abdomen. Postsurgical changes in the abdomen. There continues to be a large amount of bowel gas throughout the abdomen and most compatible with colonic dilatation. There may be small bowel gas in the mid abdomen and lateral right abdomen. Limited evaluation for free air on this supine image. IMPRESSION: Persistent dilated loops of colon containing gas. Findings are similar to the previous examination and suggestive for a colonic ileus. Minimal change from the previous examination. Nasogastric tube appears to be in the stomach. Electronically Signed   By: Richarda Overlie M.D.   On: 06/24/2017 20:49   Dg Chest Port 1 View  Result Date:  06/24/2017 CLINICAL DATA:  Elevated white count. Congestive heart failure. Ileus (HCC) K56.7 (ICD-10-CM) EXAM: PORTABLE CHEST 1 VIEW COMPARISON:  06/15/2017. FINDINGS: Support tubes and apparatus remains stable. There is clearing of RIGHT lung infiltrate. Marked gaseous distention is redemonstrated despite nasogastric tube appearing in appropriate location. No pneumothorax. IMPRESSION: Chronic changes as described.  Apparent improved aeration. Gaseous distension, consistent with the stated diagnosis of ileus. Electronically Signed   By: Elsie Stain M.D.   On: 06/24/2017 20:40     Medications:     lidocaine (PF)  Assessment/ Plan:  62 y.o. 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   1.  Severe acute renal failure. 2.  Hyponatremia. 3.  Acute respiratory failure. 4.  Anemia unspecified. 5.  Severe protein calorie malnutrition with severe emaciation. 6.  Hyperkalemia.  Plan: Patient continues to have azotemia with relatively low creatinine.  He is producing some urine.  Previously when we attempted to take him off of dialysis his mental status worsened.  Therefore we will plan to complete dialysis today.  Patient appears to be tolerating today's dialysis session well.  We will plan for dialysis again on Monday.  Hyperkalemia has improved as potassium down to 5.0 at the moment.  His albumin has stabilized at 2.4.  We will continue to monitor his progress.    LOS: 0 Iysha Mishkin 5/10/20195:01 PM

## 2017-06-26 NOTE — Progress Notes (Signed)
Pulmonary Critical Care Medicine [facility] [place]   PULMONARY SERVICE  PROGRESS NOTE  Date of Service: 06/26/2017  Melvin Kelley  YHC:[id number]  DOB: 10/22/1955   DOA: 05/23/2017  Referring Physician: Carron Curie, MD  HPI: Melvin Kelley is a 62 y.o. male seen for follow up of Acute on Chronic Respiratory Failure.  Looks good right now patient's on T collar has been on 28% oxygen saturations are excellent  Medications: Reviewed on Rounds  Physical Exam:  Vitals: Temperature 97.6 pulse 96 respiratory 27 blood pressure 114/62 saturations 99%  Ventilator Settings off of the ventilator on T collar trials  . General: Comfortable at this time . Eyes: Grossly normal lids, irises & conjunctiva . ENT: grossly tongue is normal . Neck: no obvious mass . Cardiovascular: S1-S2 normal no gallop or rub . Respiratory: No rhonchi expansion is equal . Abdomen: Soft nontender . Skin: no rash seen on limited exam . Musculoskeletal: not rigid . Psychiatric:unable to assess . Neurologic: no seizure no involuntary movements         Labs on Admission:  Basic Metabolic Panel: Recent Labs  Lab 06/21/17 0644 06/22/17 0504 06/24/17 0647 06/25/17 0643 06/26/17 0627  NA 130* 129* 131*  --  132*  K 5.3* 4.7 4.7 5.2* 5.0  CL 93* 94* 97*  --  96*  CO2 25 25 25   --  24  GLUCOSE 146* 105* 159*  --  98  BUN 80* 85* 57*  --  60*  CREATININE 1.86* 1.88* 1.55*  --  1.78*  CALCIUM 9.1 8.8* 9.2  --  8.9  MG  --  2.1  --  1.9  --   PHOS  --  7.2* 4.6  --  5.7*    Liver Function Tests: Recent Labs  Lab 06/22/17 0504 06/24/17 0647 06/26/17 0627  ALBUMIN 2.5* 2.5* 2.4*   No results for input(s): LIPASE, AMYLASE in the last 168 hours. No results for input(s): AMMONIA in the last 168 hours.  CBC: Recent Labs  Lab 06/22/17 0504 06/24/17 0647 06/24/17 1740 06/25/17 0643 06/26/17 0627  WBC 10.7* 13.2* 14.3* 16.4* 11.8*  HGB 8.0* 8.0* 7.4* 6.9* 8.7*  HCT 25.4* 25.7* 23.7*  22.4* 27.6*  MCV 81.9 82.9 82.6 85.5 86.5  PLT 320 235 307 150 192    Cardiac Enzymes: No results for input(s): CKTOTAL, CKMB, CKMBINDEX, TROPONINI in the last 168 hours.  BNP (last 3 results) No results for input(s): BNP in the last 8760 hours.  ProBNP (last 3 results) No results for input(s): PROBNP in the last 8760 hours.  Radiological Exams on Admission: Dg Abd 1 View  Result Date: 06/24/2017 CLINICAL DATA:  Ileus. EXAM: ABDOMEN - 1 VIEW COMPARISON:  06/15/2017 FINDINGS: Nasogastric tube in the left upper abdomen. Postsurgical changes in the abdomen. There continues to be a large amount of bowel gas throughout the abdomen and most compatible with colonic dilatation. There may be small bowel gas in the mid abdomen and lateral right abdomen. Limited evaluation for free air on this supine image. IMPRESSION: Persistent dilated loops of colon containing gas. Findings are similar to the previous examination and suggestive for a colonic ileus. Minimal change from the previous examination. Nasogastric tube appears to be in the stomach. Electronically Signed   By: Richarda Overlie M.D.   On: 06/24/2017 20:49   Dg Chest Port 1 View  Result Date: 06/24/2017 CLINICAL DATA:  Elevated white count. Congestive heart failure. Ileus (HCC) K56.7 (ICD-10-CM) EXAM: PORTABLE CHEST 1 VIEW COMPARISON:  06/15/2017. FINDINGS: Support tubes and apparatus remains stable. There is clearing of RIGHT lung infiltrate. Marked gaseous distention is redemonstrated despite nasogastric tube appearing in appropriate location. No pneumothorax. IMPRESSION: Chronic changes as described.  Apparent improved aeration. Gaseous distension, consistent with the stated diagnosis of ileus. Electronically Signed   By: Elsie Stain M.D.   On: 06/24/2017 20:40    Assessment/Plan Active Problems:   Cardiac arrest (HCC)   Acute on chronic respiratory failure (HCC)   DNR (do not resuscitate) discussion   AKI (acute kidney injury) (HCC)   CHF  (congestive heart failure) (HCC)   Obstructive sleep apnea   Acute kidney injury (HCC)   1. Acute on chronic respiratory failure with hypoxia continue with aerosolized T collar trials as tolerated the goal today is for 16 hours we will continue to advance.  Continue pulmonary toilet secretion management supportive care 2. Status post cardiac arrest at baseline still somewhat encephalopathic 3. Acute renal failure nephrology following along will continue with supportive care 4. Chronic congestive heart failure needs ongoing dialysis for fluid management 5. Sleep apnea nonissue   I have personally seen and evaluated the patient, evaluated laboratory and imaging results, formulated the assessment and plan and placed orders. The Patient requires high complexity decision making for assessment and support.  Case was discussed on Rounds with the Respiratory Therapy Staff  Yevonne Pax, MD [facility] Pulmonary Critical Care Medicine Sleep Medicine

## 2017-06-27 NOTE — Progress Notes (Signed)
Pulmonary Critical Care Medicine [facility] [place]   PULMONARY SERVICE  PROGRESS NOTE  Date of Service: 06/27/2017  Melvin Kelley  DJM:[id number]  DOB: [birth year]   DOA: 05/19/2017  Referring Physician: Carron Curie, MD  HPI: Melvin Kelley is a 62 y.o. male seen for follow up of Acute on Chronic Respiratory Failure.  Weaning on T collar currently is on 35% oxygen the goal is for 16 hours  Medications: Reviewed on Rounds  Physical Exam:  Vitals: Temperature 98.6 pulse 107 respiratory rate 25 blood pressure 96/35 saturations 100%  Ventilator Settings aerosolized T collar FiO2 35%  . General: Comfortable at this time . Eyes: Grossly normal lids, irises & conjunctiva . ENT: grossly tongue is normal . Neck: no obvious mass . Cardiovascular: S1-S2 normal no gallop . Respiratory: No rhonchi expansion is equal . Abdomen: Soft nontender . Skin: no rash seen on limited exam . Musculoskeletal: not rigid . Psychiatric:unable to assess . Neurologic: no seizure no involuntary movements         Labs on Admission:  Basic Metabolic Panel: Recent Labs  Lab 06/21/17 0644 06/22/17 0504 06/24/17 0647 06/25/17 0643 06/26/17 0627  NA 130* 129* 131*  --  132*  K 5.3* 4.7 4.7 5.2* 5.0  CL 93* 94* 97*  --  96*  CO2 25 25 25   --  24  GLUCOSE 146* 105* 159*  --  98  BUN 80* 85* 57*  --  60*  CREATININE 1.86* 1.88* 1.55*  --  1.78*  CALCIUM 9.1 8.8* 9.2  --  8.9  MG  --  2.1  --  1.9  --   PHOS  --  7.2* 4.6  --  5.7*    Liver Function Tests: Recent Labs  Lab 06/22/17 0504 06/24/17 0647 06/26/17 0627  ALBUMIN 2.5* 2.5* 2.4*   No results for input(s): LIPASE, AMYLASE in the last 168 hours. No results for input(s): AMMONIA in the last 168 hours.  CBC: Recent Labs  Lab 06/22/17 0504 06/24/17 0647 06/24/17 1740 06/25/17 0643 06/26/17 0627  WBC 10.7* 13.2* 14.3* 16.4* 11.8*  HGB 8.0* 8.0* 7.4* 6.9* 8.7*  HCT 25.4* 25.7* 23.7* 22.4* 27.6*  MCV 81.9 82.9  82.6 85.5 86.5  PLT 320 235 307 150 192    Cardiac Enzymes: No results for input(s): CKTOTAL, CKMB, CKMBINDEX, TROPONINI in the last 168 hours.  BNP (last 3 results) No results for input(s): BNP in the last 8760 hours.  ProBNP (last 3 results) No results for input(s): PROBNP in the last 8760 hours.  Radiological Exams on Admission: Dg Abd 1 View  Result Date: 06/24/2017 CLINICAL DATA:  Ileus. EXAM: ABDOMEN - 1 VIEW COMPARISON:  06/15/2017 FINDINGS: Nasogastric tube in the left upper abdomen. Postsurgical changes in the abdomen. There continues to be a large amount of bowel gas throughout the abdomen and most compatible with colonic dilatation. There may be small bowel gas in the mid abdomen and lateral right abdomen. Limited evaluation for free air on this supine image. IMPRESSION: Persistent dilated loops of colon containing gas. Findings are similar to the previous examination and suggestive for a colonic ileus. Minimal change from the previous examination. Nasogastric tube appears to be in the stomach. Electronically Signed   By: Richarda Overlie M.D.   On: 06/24/2017 20:49   Dg Chest Port 1 View  Result Date: 06/24/2017 CLINICAL DATA:  Elevated white count. Congestive heart failure. Ileus (HCC) K56.7 (ICD-10-CM) EXAM: PORTABLE CHEST 1 VIEW COMPARISON:  06/15/2017. FINDINGS: Support tubes  and apparatus remains stable. There is clearing of RIGHT lung infiltrate. Marked gaseous distention is redemonstrated despite nasogastric tube appearing in appropriate location. No pneumothorax. IMPRESSION: Chronic changes as described.  Apparent improved aeration. Gaseous distension, consistent with the stated diagnosis of ileus. Electronically Signed   By: Elsie Stain M.D.   On: 06/24/2017 20:40    Assessment/Plan Active Problems:   Cardiac arrest (HCC)   Acute on chronic respiratory failure (HCC)   DNR (do not resuscitate) discussion   CHF (congestive heart failure) (HCC)   Obstructive sleep apnea    Acute kidney injury (HCC)   1. Acute on chronic respiratory failure with hypoxia continue to wean on T collar as ordered tolerating it well right now patient is on 35% FiO2 we will continue pulmonary toilet supportive care 2. Acute kidney injury with tubular necrosis followed by nephrology continue with hemodialysis as ordered 3. Acute on chronic congestive heart failure we will continue with present management patient is tolerating fluid status fairly well 4. Status post cardiac arrest patient is at baseline 5. Obstructive sleep apnea nonissue   I have personally seen and evaluated the patient, evaluated laboratory and imaging results, formulated the assessment and plan and placed orders. The Patient requires high complexity decision making for assessment and support.  Case was discussed on Rounds with the Respiratory Therapy Staff  Yevonne Pax, MD [facility] Pulmonary Critical Care Medicine Sleep Medicine

## 2017-06-28 LAB — MAGNESIUM: Magnesium: 1.9 mg/dL (ref 1.7–2.4)

## 2017-06-28 NOTE — Progress Notes (Signed)
Pulmonary Critical Care Medicine [facility] [place]   PULMONARY SERVICE  PROGRESS NOTE  Date of Service: 06/28/2017  Melvin Kelley  KHT:[id number]  DOB: 26-Mar-1955   DOA: 05/23/2017  Referring Physician: Carron Curie, MD  HPI: Melvin Kelley is a 62 y.o. male seen for follow up of Acute on Chronic Respiratory Failure.  He is weaning today on T collar the goal is to try for about 20 hours  Medications: Reviewed on Rounds  Physical Exam:  Vitals: Temperature 99.0 pulse 100 respiratory rate 21 blood pressure 116/48 saturations 99%  Ventilator Settings T collar trials FiO2 28%  . General: Comfortable at this time . Eyes: Grossly normal lids, irises & conjunctiva . ENT: grossly tongue is normal . Neck: no obvious mass . Cardiovascular: S1-S2 normal no gallop or rub . Respiratory: No rhonchi expansion equal . Abdomen: Soft and nontender . Skin: no rash seen on limited exam . Musculoskeletal: not rigid . Psychiatric:unable to assess . Neurologic: no seizure no involuntary movements         Labs on Admission:  Basic Metabolic Panel: Recent Labs  Lab 06/22/17 0504 06/24/17 0647 06/25/17 0643 06/26/17 0627 06/28/17 0710  NA 129* 131*  --  132*  --   K 4.7 4.7 5.2* 5.0  --   CL 94* 97*  --  96*  --   CO2 25 25  --  24  --   GLUCOSE 105* 159*  --  98  --   BUN 85* 57*  --  60*  --   CREATININE 1.88* 1.55*  --  1.78*  --   CALCIUM 8.8* 9.2  --  8.9  --   MG 2.1  --  1.9  --  1.9  PHOS 7.2* 4.6  --  5.7*  --     Liver Function Tests: Recent Labs  Lab 06/22/17 0504 06/24/17 0647 06/26/17 0627  ALBUMIN 2.5* 2.5* 2.4*   No results for input(s): LIPASE, AMYLASE in the last 168 hours. No results for input(s): AMMONIA in the last 168 hours.  CBC: Recent Labs  Lab 06/22/17 0504 06/24/17 0647 06/24/17 1740 06/25/17 0643 06/26/17 0627  WBC 10.7* 13.2* 14.3* 16.4* 11.8*  HGB 8.0* 8.0* 7.4* 6.9* 8.7*  HCT 25.4* 25.7* 23.7* 22.4* 27.6*  MCV 81.9 82.9  82.6 85.5 86.5  PLT 320 235 307 150 192    Cardiac Enzymes: No results for input(s): CKTOTAL, CKMB, CKMBINDEX, TROPONINI in the last 168 hours.  BNP (last 3 results) No results for input(s): BNP in the last 8760 hours.  ProBNP (last 3 results) No results for input(s): PROBNP in the last 8760 hours.  Radiological Exams on Admission: Dg Abd 1 View  Result Date: 06/24/2017 CLINICAL DATA:  Ileus. EXAM: ABDOMEN - 1 VIEW COMPARISON:  06/15/2017 FINDINGS: Nasogastric tube in the left upper abdomen. Postsurgical changes in the abdomen. There continues to be a large amount of bowel gas throughout the abdomen and most compatible with colonic dilatation. There may be small bowel gas in the mid abdomen and lateral right abdomen. Limited evaluation for free air on this supine image. IMPRESSION: Persistent dilated loops of colon containing gas. Findings are similar to the previous examination and suggestive for a colonic ileus. Minimal change from the previous examination. Nasogastric tube appears to be in the stomach. Electronically Signed   By: Richarda Overlie M.D.   On: 06/24/2017 20:49   Dg Chest Port 1 View  Result Date: 06/24/2017 CLINICAL DATA:  Elevated white count. Congestive  heart failure. Ileus (HCC) K56.7 (ICD-10-CM) EXAM: PORTABLE CHEST 1 VIEW COMPARISON:  06/15/2017. FINDINGS: Support tubes and apparatus remains stable. There is clearing of RIGHT lung infiltrate. Marked gaseous distention is redemonstrated despite nasogastric tube appearing in appropriate location. No pneumothorax. IMPRESSION: Chronic changes as described.  Apparent improved aeration. Gaseous distension, consistent with the stated diagnosis of ileus. Electronically Signed   By: Elsie Stain M.D.   On: 06/24/2017 20:40    Assessment/Plan Active Problems:   Cardiac arrest (HCC)   Acute on chronic respiratory failure (HCC)   DNR (do not resuscitate) discussion   CHF (congestive heart failure) (HCC)   Obstructive sleep apnea    Acute kidney injury (HCC)   1. Acute on chronic respiratory failure with hypoxia continue with T collar trials patient's goal is for 20 hours doing well so far 2. Acute tubular necrosis followed by nephrology continue with dialysis 3. Chronic congestive heart failure needs ongoing fluid management 4. Status post cardiac arrest at baseline we will continue supportive care   I have personally seen and evaluated the patient, evaluated laboratory and imaging results, formulated the assessment and plan and placed orders. The Patient requires high complexity decision making for assessment and support.  Case was discussed on Rounds with the Respiratory Therapy Staff  Yevonne Pax, MD [facility] Pulmonary Critical Care Medicine Sleep Medicine

## 2017-06-29 LAB — RENAL FUNCTION PANEL
ALBUMIN: 2.3 g/dL — AB (ref 3.5–5.0)
ANION GAP: 13 (ref 5–15)
BUN: 73 mg/dL — AB (ref 6–20)
CALCIUM: 9.1 mg/dL (ref 8.9–10.3)
CO2: 20 mmol/L — ABNORMAL LOW (ref 22–32)
Chloride: 100 mmol/L — ABNORMAL LOW (ref 101–111)
Creatinine, Ser: 1.86 mg/dL — ABNORMAL HIGH (ref 0.61–1.24)
GFR calc Af Amer: 44 mL/min — ABNORMAL LOW (ref >60)
GFR, EST NON AFRICAN AMERICAN: 38 mL/min — AB (ref >60)
GLUCOSE: 120 mg/dL — AB (ref 65–99)
PHOSPHORUS: 6.4 mg/dL — AB (ref 2.5–4.6)
Potassium: 5.9 mmol/L — ABNORMAL HIGH (ref 3.5–5.1)
SODIUM: 133 mmol/L — AB (ref 135–145)

## 2017-06-29 LAB — CBC
HCT: 25.8 % — ABNORMAL LOW (ref 39.0–52.0)
HEMOGLOBIN: 8.3 g/dL — AB (ref 13.0–17.0)
MCH: 27.2 pg (ref 26.0–34.0)
MCHC: 32.2 g/dL (ref 30.0–36.0)
MCV: 84.6 fL (ref 78.0–100.0)
Platelets: 236 10*3/uL (ref 150–400)
RBC: 3.05 MIL/uL — ABNORMAL LOW (ref 4.22–5.81)
RDW: 17.4 % — ABNORMAL HIGH (ref 11.5–15.5)
WBC: 13.1 10*3/uL — ABNORMAL HIGH (ref 4.0–10.5)

## 2017-06-29 NOTE — Progress Notes (Signed)
[facility]  ROUNDING NOTE   Subjective:  Patient currently on the ventilator with FiO2 40% with PEEP of 5. Remains on TPN. Due for dialysis again later today.   Objective:  Vital signs in last 24 hours:  Temperature 96.8 pulse 98 respirations 22 blood pressure 96/48  Physical Exam: General: Chronically ill-appearing  Head: Severe temporal wasting, NG in place  Eyes: Anicteric  Neck: Tracheostomy in place  Lungs:  Scattered rhonchi, vent assisted  Heart: S1S2 no rubs  Abdomen:  Soft, nontender, bowel sounds present  Extremities: No peripheral edema.  Neurologic: Awake, alert, will follow simple commands  Skin: No lesions  Access: Right subclavain temporary dialysis catheter    Basic Metabolic Panel: Recent Labs  Lab 06/24/17 0647 06/25/17 0643 06/26/17 0627 06/28/17 0710 06/29/17 0655  NA 131*  --  132*  --  133*  K 4.7 5.2* 5.0  --  5.9*  CL 97*  --  96*  --  100*  CO2 25  --  24  --  20*  GLUCOSE 159*  --  98  --  120*  BUN 57*  --  60*  --  73*  CREATININE 1.55*  --  1.78*  --  1.86*  CALCIUM 9.2  --  8.9  --  9.1  MG  --  1.9  --  1.9  --   PHOS 4.6  --  5.7*  --  6.4*    Liver Function Tests: Recent Labs  Lab 06/24/17 0647 06/26/17 0627 06/29/17 0655  ALBUMIN 2.5* 2.4* 2.3*   No results for input(s): LIPASE, AMYLASE in the last 168 hours. No results for input(s): AMMONIA in the last 168 hours.  CBC: Recent Labs  Lab 06/24/17 0647 06/24/17 1740 06/25/17 0643 06/26/17 0627 06/29/17 0655  WBC 13.2* 14.3* 16.4* 11.8* PENDING  HGB 8.0* 7.4* 6.9* 8.7* 8.3*  HCT 25.7* 23.7* 22.4* 27.6* 25.8*  MCV 82.9 82.6 85.5 86.5 84.6  PLT 235 307 150 192 PENDING    Cardiac Enzymes: No results for input(s): CKTOTAL, CKMB, CKMBINDEX, TROPONINI in the last 168 hours.  BNP: Invalid input(s): POCBNP  CBG: No results for input(s): GLUCAP in the last 168 hours.  Microbiology: Results for orders placed or performed during the hospital  encounter of 05/24/17  MRSA PCR Screening     Status: Abnormal   Collection Time: 05/24/17  6:01 AM  Result Value Ref Range Status   MRSA by PCR POSITIVE (A) NEGATIVE Final    Comment:        The GeneXpert MRSA Assay (FDA approved for NASAL specimens only), is one component of a comprehensive MRSA colonization surveillance program. It is not intended to diagnose MRSA infection nor to guide or monitor treatment for MRSA infections. RESULT CALLED TO, READ BACK BY AND VERIFIED WITHAlgis Downs [facility] RN AT [phone] 05/24/17 BY A.DAVIS Performed at [facility], [address], [place], [place] [address]   Fungus culture, blood     Status: None   Collection Time: 05/24/17  6:15 AM  Result Value Ref Range Status   Specimen Description BLOOD RIGHT HAND  Final   Special Requests   Final    BOTTLES DRAWN AEROBIC ONLY Blood Culture results may not be optimal due to an inadequate volume of blood received in culture bottles   Culture   Final    NO GROWTH 7 DAYS NO FUNGUS ISOLATED Performed at [facility], [address], [place], [place] [address]  Report Status 05/31/2017 FINAL  Final  Culture, blood (routine x 2)     Status: None   Collection Time: 05/24/17  6:20 AM  Result Value Ref Range Status   Specimen Description BLOOD RIGHT HAND  Final   Special Requests   Final    BOTTLES DRAWN AEROBIC ONLY Blood Culture results may not be optimal due to an inadequate volume of blood received in culture bottles   Culture   Final    NO GROWTH 5 DAYS Performed at [facility], [address] [place], [place] [address]    Report Status 06/10/2017 FINAL  Final  Culture, blood (routine x 2)     Status: None   Collection Time: 05/24/17  6:41 AM  Result Value Ref Range Status   Specimen Description BLOOD LEFT ANTECUBITAL  Final   Special Requests   Final    BOTTLES DRAWN AEROBIC ONLY Blood Culture results may not be optimal due to an inadequate volume of blood received in culture bottles    Culture   Final    NO GROWTH 5 DAYS Performed at [facility], [address] [place], [place] [address]    Report Status 05/25/2017 FINAL  Final  Culture, respiratory (NON-Expectorated)     Status: None   Collection Time: 05/24/17  3:25 PM  Result Value Ref Range Status   Specimen Description TRACHEAL ASPIRATE  Final   Special Requests NONE  Final   Gram Stain   Final    ABUNDANT WBC PRESENT,BOTH PMN AND MONONUCLEAR RARE SQUAMOUS EPITHELIAL CELLS PRESENT RARE GRAM POSITIVE COCCI IN PAIRS IN CHAINS RARE GRAM NEGATIVE COCCOBACILLI    Culture   Final    FEW Consistent with normal respiratory flora. Performed at [facility], [address] [place], [place] [address]    Report Status 05/27/2017 FINAL  Final    Coagulation Studies: No results for input(s): LABPROT, INR in the last 72 hours.  Urinalysis: No results for input(s): COLORURINE, LABSPEC, PHURINE, GLUCOSEU, HGBUR, BILIRUBINUR, KETONESUR, PROTEINUR, UROBILINOGEN, NITRITE, LEUKOCYTESUR in the last 72 hours.  Invalid input(s): APPERANCEUR    Imaging: No results found.   Medications:     lidocaine (PF)  Assessment/ Plan:  62 y.o. 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   1.  Severe acute renal failure. 2.  Hyponatremia. 3.  Acute respiratory failure. 4.  Anemia unspecified. 5.  Severe protein calorie malnutrition with severe emaciation. 6.  Hyperkalemia.  Plan: Patient due for hemodialysis today.  Orders have been prepared.  Potassium  noted to be high at 5.9.  This should come down appropriately with dialysis.  Phosphorus also noted to be high at 6.4.  This will also be reduced with dialysis treatment.  Patient has very little muscle mass and cannot generate a high creatinine.  However BUN was up over the weekend of 73.  Patient currently back on the ventilator for respiratory support.    LOS: 0 Melvin Kelley 5/13/20198:25 AM

## 2017-06-29 NOTE — Progress Notes (Signed)
Pulmonary Critical Care Medicine [facility] [place]   PULMONARY SERVICE  PROGRESS NOTE  Date of Service: 06/29/2017  Melvin Kelley  PVG:[id number]  DOB: [birth year]   DOA: Jun 27, 2017  Referring Physician: Carron Curie, MD  HPI: Melvin Kelley is a 62 y.o. male seen for follow up of Acute on Chronic Respiratory Failure.  Currently patient is weaning on T collar on 40% oxygen doing fairly well  Medications: Reviewed on Rounds  Physical Exam:  Vitals: Temperature 96.8 pulse 98 respiratory 22 blood pressure 96/48 saturations 97%  Ventilator Settings weaning on T collar FiO2 40%  . General: Comfortable at this time . Eyes: Grossly normal lids, irises & conjunctiva . ENT: grossly tongue is normal . Neck: no obvious mass . Cardiovascular: S1-S2 normal no gallop or rub . Respiratory: No rhonchi expansion equal . Abdomen: Soft . Skin: no rash seen on limited exam . Musculoskeletal: not rigid . Psychiatric:unable to assess . Neurologic: no seizure no involuntary movements         Labs on Admission:  Basic Metabolic Panel: Recent Labs  Lab 06/24/17 0647 06/25/17 0643 06/26/17 0627 06/28/17 0710 06/29/17 0655  NA 131*  --  132*  --  133*  K 4.7 5.2* 5.0  --  5.9*  CL 97*  --  96*  --  100*  CO2 25  --  24  --  20*  GLUCOSE 159*  --  98  --  120*  BUN 57*  --  60*  --  73*  CREATININE 1.55*  --  1.78*  --  1.86*  CALCIUM 9.2  --  8.9  --  9.1  MG  --  1.9  --  1.9  --   PHOS 4.6  --  5.7*  --  6.4*    Liver Function Tests: Recent Labs  Lab 06/24/17 0647 06/26/17 0627 06/29/17 0655  ALBUMIN 2.5* 2.4* 2.3*   No results for input(s): LIPASE, AMYLASE in the last 168 hours. No results for input(s): AMMONIA in the last 168 hours.  CBC: Recent Labs  Lab 06/24/17 0647 06/24/17 1740 06/25/17 0643 06/26/17 0627 06/29/17 0655  WBC 13.2* 14.3* 16.4* 11.8* 13.1*  HGB 8.0* 7.4* 6.9* 8.7* 8.3*  HCT 25.7* 23.7* 22.4* 27.6* 25.8*  MCV 82.9 82.6 85.5 86.5  84.6  PLT 235 307 150 192 236    Cardiac Enzymes: No results for input(s): CKTOTAL, CKMB, CKMBINDEX, TROPONINI in the last 168 hours.  BNP (last 3 results) No results for input(s): BNP in the last 8760 hours.  ProBNP (last 3 results) No results for input(s): PROBNP in the last 8760 hours.  Radiological Exams on Admission: No results found.  Assessment/Plan Active Problems:   Cardiac arrest (HCC)   Acute on chronic respiratory failure (HCC)   DNR (do not resuscitate) discussion   CHF (congestive heart failure) (HCC)   Obstructive sleep apnea   Acute kidney injury (HCC)   1. Acute on chronic respiratory failure with hypoxia we will continue to advance to wean titrate oxygen as tolerated the goal for T collar weight is 28 hours today 2. Chronic congestive heart failure with acute decompensation and has stabilized continue with dialysis 3. Acute kidney injury on dialysis nephrology following 4. Obstructive sleep apnea nonissue at this time we will continue to monitor 5. Status post cardiac arrest at baseline   I have personally seen and evaluated the patient, evaluated laboratory and imaging results, formulated the assessment and plan and placed orders. The Patient requires high  complexity decision making for assessment and support.  Case was discussed on Rounds with the Respiratory Therapy Staff  Allyne Gee, MD [facility] Pulmonary Critical Care Medicine Sleep Medicine

## 2017-06-30 NOTE — Progress Notes (Signed)
Pulmonary Critical Care Medicine [facility] [place]   PULMONARY SERVICE  PROGRESS NOTE  Date of Service: 06/30/2017  Melvin Kelley  PIR:[id number]  DOB: 05-Dec-1955   DOA: 06/08/2017  Referring Physician: Carron Curie, MD  HPI: Melvin Kelley is a 62 y.o. male seen for follow up of Acute on Chronic Respiratory Failure.  Doing well today weaning on T collar.  Patient has been on 28% bolus for 24 hours off the ventilator  Medications: Reviewed on Rounds  Physical Exam:  Vitals: Temperature 98.0 pulse 108 respiratory 25 blood pressure 120/62 saturations 98%  Ventilator Settings currently off the ventilator  . General: Comfortable at this time . Eyes: Grossly normal lids, irises & conjunctiva . ENT: grossly tongue is normal . Neck: no obvious mass . Cardiovascular: S1-S2 normal no gallop or rub . Respiratory: No rhonchi . Abdomen: Soft . Skin: no rash seen on limited exam . Musculoskeletal: not rigid . Psychiatric:unable to assess . Neurologic: no seizure no involuntary movements         Labs on Admission:  Basic Metabolic Panel: Recent Labs  Lab 06/24/17 0647 06/25/17 0643 06/26/17 0627 06/28/17 0710 06/29/17 0655  NA 131*  --  132*  --  133*  K 4.7 5.2* 5.0  --  5.9*  CL 97*  --  96*  --  100*  CO2 25  --  24  --  20*  GLUCOSE 159*  --  98  --  120*  BUN 57*  --  60*  --  73*  CREATININE 1.55*  --  1.78*  --  1.86*  CALCIUM 9.2  --  8.9  --  9.1  MG  --  1.9  --  1.9  --   PHOS 4.6  --  5.7*  --  6.4*    Liver Function Tests: Recent Labs  Lab 06/24/17 0647 06/26/17 0627 06/29/17 0655  ALBUMIN 2.5* 2.4* 2.3*   No results for input(s): LIPASE, AMYLASE in the last 168 hours. No results for input(s): AMMONIA in the last 168 hours.  CBC: Recent Labs  Lab 06/24/17 0647 06/24/17 1740 06/25/17 0643 06/26/17 0627 06/29/17 0655  WBC 13.2* 14.3* 16.4* 11.8* 13.1*  HGB 8.0* 7.4* 6.9* 8.7* 8.3*  HCT 25.7* 23.7* 22.4* 27.6* 25.8*  MCV 82.9  82.6 85.5 86.5 84.6  PLT 235 307 150 192 236    Cardiac Enzymes: No results for input(s): CKTOTAL, CKMB, CKMBINDEX, TROPONINI in the last 168 hours.  BNP (last 3 results) No results for input(s): BNP in the last 8760 hours.  ProBNP (last 3 results) No results for input(s): PROBNP in the last 8760 hours.  Radiological Exams on Admission: No results found.  Assessment/Plan Active Problems:   Cardiac arrest (HCC)   Acute on chronic respiratory failure (HCC)   DNR (do not resuscitate) discussion   CHF (congestive heart failure) (HCC)   Obstructive sleep apnea   Acute kidney injury (HCC)   1. Acute on chronic respiratory failure with hypoxia we will continue with weaning on T collar trials continue pulmonary toilet secretion management 2. Congestive heart failure systolic we will continue present management monitor fluid status 3. Acute renal failure secondary to tubular necrosis patient is on dialysis which will be continued 4. Sleep apnea nonissue 5. Status post cardiac arrest at baseline   I have personally seen and evaluated the patient, evaluated laboratory and imaging results, formulated the assessment and plan and placed orders. The Patient requires high complexity decision making for assessment and  support.  Case was discussed on Rounds with the Respiratory Therapy Staff  Allyne Gee, MD [facility] Pulmonary Critical Care Medicine Sleep Medicine

## 2017-07-01 LAB — CBC
HEMATOCRIT: 27.8 % — AB (ref 39.0–52.0)
Hemoglobin: 8.6 g/dL — ABNORMAL LOW (ref 13.0–17.0)
MCH: 26.6 pg (ref 26.0–34.0)
MCHC: 30.9 g/dL (ref 30.0–36.0)
MCV: 86.1 fL (ref 78.0–100.0)
Platelets: 266 10*3/uL (ref 150–400)
RBC: 3.23 MIL/uL — ABNORMAL LOW (ref 4.22–5.81)
RDW: 17.5 % — AB (ref 11.5–15.5)
WBC: 9.1 10*3/uL (ref 4.0–10.5)

## 2017-07-01 LAB — RENAL FUNCTION PANEL
ALBUMIN: 2.3 g/dL — AB (ref 3.5–5.0)
Anion gap: 10 (ref 5–15)
BUN: 56 mg/dL — AB (ref 6–20)
CO2: 26 mmol/L (ref 22–32)
Calcium: 9 mg/dL (ref 8.9–10.3)
Chloride: 96 mmol/L — ABNORMAL LOW (ref 101–111)
Creatinine, Ser: 1.55 mg/dL — ABNORMAL HIGH (ref 0.61–1.24)
GFR calc Af Amer: 54 mL/min — ABNORMAL LOW (ref >60)
GFR calc non Af Amer: 47 mL/min — ABNORMAL LOW (ref >60)
GLUCOSE: 138 mg/dL — AB (ref 65–99)
PHOSPHORUS: 4.7 mg/dL — AB (ref 2.5–4.6)
POTASSIUM: 3.9 mmol/L (ref 3.5–5.1)
SODIUM: 132 mmol/L — AB (ref 135–145)

## 2017-07-01 LAB — MAGNESIUM: MAGNESIUM: 1.7 mg/dL (ref 1.7–2.4)

## 2017-07-01 NOTE — Progress Notes (Signed)
Pulmonary Critical Care Medicine [facility] [place]   PULMONARY SERVICE  PROGRESS NOTE  Date of Service: 07/01/2017  Melvin Kelley  TKP:[id number]  DOB: 01-Sep-1955   DOA: 06-14-2017  Referring Physician: Carron Curie, MD  HPI: Melvin Kelley is a 62 y.o. male seen for follow up of Acute on Chronic Respiratory Failure.  Patient is weaning on T collar right now has been on 20% oxygen.  Tolerating PMV fairly well also  Medications: Reviewed on Rounds  Physical Exam:  Vitals: Temperature 97.4 pulse 96 respiratory rate 32 blood pressure 135/86 saturations 100%  Ventilator Settings on aerosolized T collar FiO2 28% with the PMV in place  . General: Comfortable at this time . Eyes: Grossly normal lids, irises & conjunctiva . ENT: grossly tongue is normal . Neck: no obvious mass . Cardiovascular: S1-S2 normal no gallop or rub . Respiratory: No rhonchi expansion is equal . Abdomen: Soft and nontender . Skin: no rash seen on limited exam . Musculoskeletal: not rigid . Psychiatric:unable to assess . Neurologic: no seizure no involuntary movements         Labs on Admission:  Basic Metabolic Panel: Recent Labs  Lab 06/25/17 0643 06/26/17 0627 06/28/17 0710 06/29/17 0655 07/01/17 0630  NA  --  132*  --  133* 132*  K 5.2* 5.0  --  5.9* 3.9  CL  --  96*  --  100* 96*  CO2  --  24  --  20* 26  GLUCOSE  --  98  --  120* 138*  BUN  --  60*  --  73* 56*  CREATININE  --  1.78*  --  1.86* 1.55*  CALCIUM  --  8.9  --  9.1 9.0  MG 1.9  --  1.9  --  1.7  PHOS  --  5.7*  --  6.4* 4.7*    Liver Function Tests: Recent Labs  Lab 06/26/17 0627 06/29/17 0655 07/01/17 0630  ALBUMIN 2.4* 2.3* 2.3*   No results for input(s): LIPASE, AMYLASE in the last 168 hours. No results for input(s): AMMONIA in the last 168 hours.  CBC: Recent Labs  Lab 06/24/17 1740 06/25/17 0643 06/26/17 0627 06/29/17 0655 07/01/17 0630  WBC 14.3* 16.4* 11.8* 13.1* 9.1  HGB 7.4* 6.9* 8.7*  8.3* 8.6*  HCT 23.7* 22.4* 27.6* 25.8* 27.8*  MCV 82.6 85.5 86.5 84.6 86.1  PLT 307 150 192 236 266    Cardiac Enzymes: No results for input(s): CKTOTAL, CKMB, CKMBINDEX, TROPONINI in the last 168 hours.  BNP (last 3 results) No results for input(s): BNP in the last 8760 hours.  ProBNP (last 3 results) No results for input(s): PROBNP in the last 8760 hours.  Radiological Exams on Admission: No results found.  Assessment/Plan Active Problems:   Cardiac arrest (HCC)   Acute on chronic respiratory failure (HCC)   DNR (do not resuscitate) discussion   CHF (congestive heart failure) (HCC)   Obstructive sleep apnea   Acute kidney injury (HCC)   1. Acute on chronic respiratory failure with hypoxia patient is back off of the ventilator should be able to continue to advance weaning hopefully to capping we will continue with secretion management pulmonary toilet 2. Acute renal failure being seen by nephrology will continue to monitor labs 3. Congestive heart failure at baseline we will continue with supportive care 4. Status post cardiac arrest little bit more awake 5. Sleep apnea nonissue   I have personally seen and evaluated the patient, evaluated laboratory and  imaging results, formulated the assessment and plan and placed orders. The Patient requires high complexity decision making for assessment and support.  Case was discussed on Rounds with the Respiratory Therapy Staff  Allyne Gee, MD [facility] Pulmonary Critical Care Medicine Sleep Medicine

## 2017-07-01 NOTE — Progress Notes (Signed)
[facility]  ROUNDING NOTE   Subjective:  Patient status appears to have improved. He is sitting up in a chair and conversant. Tracheostomy capped at the moment.   Objective:  Vital signs in last 24 hours:  Temperature 97.4 pulse 96 respirations 32 blood pressure 135/86  Physical Exam: General: Chronically ill-appearing  Head: Severe temporal wasting, NG in place  Eyes: Anicteric  Neck: Tracheostomy in place  Lungs:  Scattered rhonchi, normal effort  Heart: S1S2 no rubs  Abdomen:  Soft, nontender, bowel sounds present  Extremities: No peripheral edema.  Neurologic: Awake, alert, conversant  Skin: No lesions  Access: Right subclavain temporary dialysis catheter    Basic Metabolic Panel: Recent Labs  Lab 06/25/17 0643 06/26/17 0627 06/28/17 0710 06/29/17 0655 07/01/17 0630  NA  --  132*  --  133* 132*  K 5.2* 5.0  --  5.9* 3.9  CL  --  96*  --  100* 96*  CO2  --  24  --  20* 26  GLUCOSE  --  98  --  120* 138*  BUN  --  60*  --  73* 56*  CREATININE  --  1.78*  --  1.86* 1.55*  CALCIUM  --  8.9  --  9.1 9.0  MG 1.9  --  1.9  --  1.7  PHOS  --  5.7*  --  6.4* 4.7*    Liver Function Tests: Recent Labs  Lab 06/26/17 0627 06/29/17 0655 07/01/17 0630  ALBUMIN 2.4* 2.3* 2.3*   No results for input(s): LIPASE, AMYLASE in the last 168 hours. No results for input(s): AMMONIA in the last 168 hours.  CBC: Recent Labs  Lab 06/24/17 1740 06/25/17 0643 06/26/17 0627 06/29/17 0655 07/01/17 0630  WBC 14.3* 16.4* 11.8* 13.1* 9.1  HGB 7.4* 6.9* 8.7* 8.3* 8.6*  HCT 23.7* 22.4* 27.6* 25.8* 27.8*  MCV 82.6 85.5 86.5 84.6 86.1  PLT 307 150 192 236 266    Cardiac Enzymes: No results for input(s): CKTOTAL, CKMB, CKMBINDEX, TROPONINI in the last 168 hours.  BNP: Invalid input(s): POCBNP  CBG: No results for input(s): GLUCAP in the last 168 hours.  Microbiology: Results for orders placed or performed during the hospital encounter of 05/24/17  MRSA  PCR Screening     Status: Abnormal   Collection Time: 05/24/17  6:01 AM  Result Value Ref Range Status   MRSA by PCR POSITIVE (A) NEGATIVE Final    Comment:        The GeneXpert MRSA Assay (FDA approved for NASAL specimens only), is one component of a comprehensive MRSA colonization surveillance program. It is not intended to diagnose MRSA infection nor to guide or monitor treatment for MRSA infections. RESULT CALLED TO, READ BACK BY AND VERIFIED WITHAlgis Downs [facility] RN AT [phone] 05/24/17 BY A.DAVIS Performed at [facility], [address] [place], [place] [address]   Fungus culture, blood     Status: None   Collection Time: 05/24/17  6:15 AM  Result Value Ref Range Status   Specimen Description BLOOD RIGHT HAND  Final   Special Requests   Final    BOTTLES DRAWN AEROBIC ONLY Blood Culture results may not be optimal due to an inadequate volume of blood received in culture bottles   Culture   Final    NO GROWTH 7 DAYS NO FUNGUS ISOLATED Performed at [facility], [address], [place], [place] [address]    Report Status 05/31/2017 FINAL  Final  Culture, blood (routine x 2)     Status: None   Collection Time: 05/24/17  6:20 AM  Result Value Ref Range Status   Specimen Description BLOOD RIGHT HAND  Final   Special Requests   Final    BOTTLES DRAWN AEROBIC ONLY Blood Culture results may not be optimal due to an inadequate volume of blood received in culture bottles   Culture   Final    NO GROWTH 5 DAYS Performed at [facility], [address], [place], [place] [address]    Report Status 06/04/2017 FINAL  Final  Culture, blood (routine x 2)     Status: None   Collection Time: 05/24/17  6:41 AM  Result Value Ref Range Status   Specimen Description BLOOD LEFT ANTECUBITAL  Final   Special Requests   Final    BOTTLES DRAWN AEROBIC ONLY Blood Culture results may not be optimal due to an inadequate volume of blood received in culture bottles   Culture   Final    NO  GROWTH 5 DAYS Performed at [facility], [address] [place], [place] [address]    Report Status 05/23/2017 FINAL  Final  Culture, respiratory (NON-Expectorated)     Status: None   Collection Time: 05/24/17  3:25 PM  Result Value Ref Range Status   Specimen Description TRACHEAL ASPIRATE  Final   Special Requests NONE  Final   Gram Stain   Final    ABUNDANT WBC PRESENT,BOTH PMN AND MONONUCLEAR RARE SQUAMOUS EPITHELIAL CELLS PRESENT RARE GRAM POSITIVE COCCI IN PAIRS IN CHAINS RARE GRAM NEGATIVE COCCOBACILLI    Culture   Final    FEW Consistent with normal respiratory flora. Performed at [facility], [address], [place], [place] [address]    Report Status 05/27/2017 FINAL  Final    Coagulation Studies: No results for input(s): LABPROT, INR in the last 72 hours.  Urinalysis: No results for input(s): COLORURINE, LABSPEC, PHURINE, GLUCOSEU, HGBUR, BILIRUBINUR, KETONESUR, PROTEINUR, UROBILINOGEN, NITRITE, LEUKOCYTESUR in the last 72 hours.  Invalid input(s): APPERANCEUR    Imaging: No results found.   Medications:     lidocaine (PF)  Assessment/ Plan:  62 y.o. 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   1.  Severe acute renal failure. 2.  Hyponatremia. 3.  Acute respiratory failure. 4.  Anemia unspecified. 5.  Severe protein calorie malnutrition with severe emaciation. 6.  Hyperkalemia.  Plan: Patient much improved with dialysis treatments.  Previously when we stop dialysis the patient's mental  status became much worse.  He cannot generate a very high creatinine given very low muscle mass.  BUN down to 56.  We will plan for dialysis again on Friday.  Anemia also improved with hemoglobin up to 8.6.  Respiratory failure also improved as he is currently off of the ventilator and has his tracheostomy capped.    LOS: 0 Ahilyn Nell 5/15/201912:07 PM

## 2017-07-02 LAB — HEPATITIS B SURFACE ANTIGEN: Hepatitis B Surface Ag: NEGATIVE

## 2017-07-02 NOTE — Progress Notes (Signed)
Pulmonary Critical Care Medicine [facility] [place]   PULMONARY SERVICE  PROGRESS NOTE  Date of Service: 07/02/2017  Melvin Kelley  UJW:[id number]  DOB: [birth year]   DOA: 06-19-2017  Referring Physician: Carron Curie, MD  HPI: Melvin Kelley is a 62 y.o. male seen for follow up of Acute on Chronic Respiratory Failure.  Patient is on T collar has not been able to go for prolonged periods off the ventilator so my suspicion is he is going to require some form of nocturnal vent support  Medications: Reviewed on Rounds  Physical Exam:  Vitals: Temperature 98.0 pulse 121 respiratory rate 30 blood pressure 121/57 saturations 100%  Ventilator Settings aerosolized T collar FiO2 30%  . General: Comfortable at this time . Eyes: Grossly normal lids, irises & conjunctiva . ENT: grossly tongue is normal . Neck: no obvious mass . Cardiovascular: S1-S2 normal no gallop or rub . Respiratory: No rhonchi expansion is equal . Abdomen: Soft nontender . Skin: no rash seen on limited exam . Musculoskeletal: not rigid . Psychiatric:unable to assess . Neurologic: no seizure no involuntary movements         Labs on Admission:  Basic Metabolic Panel: Recent Labs  Lab 06/26/17 0627 06/28/17 0710 06/29/17 0655 07/01/17 0630  NA 132*  --  133* 132*  K 5.0  --  5.9* 3.9  CL 96*  --  100* 96*  CO2 24  --  20* 26  GLUCOSE 98  --  120* 138*  BUN 60*  --  73* 56*  CREATININE 1.78*  --  1.86* 1.55*  CALCIUM 8.9  --  9.1 9.0  MG  --  1.9  --  1.7  PHOS 5.7*  --  6.4* 4.7*    Liver Function Tests: Recent Labs  Lab 06/26/17 0627 06/29/17 0655 07/01/17 0630  ALBUMIN 2.4* 2.3* 2.3*   No results for input(s): LIPASE, AMYLASE in the last 168 hours. No results for input(s): AMMONIA in the last 168 hours.  CBC: Recent Labs  Lab 06/26/17 0627 06/29/17 0655 07/01/17 0630  WBC 11.8* 13.1* 9.1  HGB 8.7* 8.3* 8.6*  HCT 27.6* 25.8* 27.8*  MCV 86.5 84.6 86.1  PLT 192 236 266     Cardiac Enzymes: No results for input(s): CKTOTAL, CKMB, CKMBINDEX, TROPONINI in the last 168 hours.  BNP (last 3 results) No results for input(s): BNP in the last 8760 hours.  ProBNP (last 3 results) No results for input(s): PROBNP in the last 8760 hours.  Radiological Exams on Admission: No results found.  Assessment/Plan Active Problems:   Cardiac arrest (HCC)   Acute on chronic respiratory failure (HCC)   DNR (do not resuscitate) discussion   CHF (congestive heart failure) (HCC)   Obstructive sleep apnea   Acute kidney injury (HCC)   1. Acute on chronic respiratory failure with hypoxia we will continue with T collar for now placed back on the ventilator after 12 hours we will continue with supportive care 2. Chronic congestive heart failure at this time is compensated we will continue with present management 3. Acute renal failure patient is being followed by nephrology and dialysis 4. Obstructive sleep apnea nonissue 5. Status post cardiac arrest at baseline   I have personally seen and evaluated the patient, evaluated laboratory and imaging results, formulated the assessment and plan and placed orders. The Patient requires high complexity decision making for assessment and support.  Case was discussed on Rounds with the Respiratory Therapy Staff  Yevonne Pax, MD [facility]  Pulmonary Critical Care Medicine Sleep Medicine

## 2017-07-03 LAB — RENAL FUNCTION PANEL
ANION GAP: 10 (ref 5–15)
Albumin: 2.4 g/dL — ABNORMAL LOW (ref 3.5–5.0)
BUN: 48 mg/dL — ABNORMAL HIGH (ref 6–20)
CHLORIDE: 95 mmol/L — AB (ref 101–111)
CO2: 26 mmol/L (ref 22–32)
Calcium: 9.1 mg/dL (ref 8.9–10.3)
Creatinine, Ser: 1.72 mg/dL — ABNORMAL HIGH (ref 0.61–1.24)
GFR calc non Af Amer: 41 mL/min — ABNORMAL LOW (ref >60)
GFR, EST AFRICAN AMERICAN: 48 mL/min — AB (ref >60)
Glucose, Bld: 139 mg/dL — ABNORMAL HIGH (ref 65–99)
POTASSIUM: 4.7 mmol/L (ref 3.5–5.1)
Phosphorus: 4.6 mg/dL (ref 2.5–4.6)
Sodium: 131 mmol/L — ABNORMAL LOW (ref 135–145)

## 2017-07-03 LAB — CBC
HCT: 25.6 % — ABNORMAL LOW (ref 39.0–52.0)
HEMOGLOBIN: 8 g/dL — AB (ref 13.0–17.0)
MCH: 27 pg (ref 26.0–34.0)
MCHC: 31.3 g/dL (ref 30.0–36.0)
MCV: 86.5 fL (ref 78.0–100.0)
Platelets: 193 10*3/uL (ref 150–400)
RBC: 2.96 MIL/uL — AB (ref 4.22–5.81)
RDW: 17.8 % — ABNORMAL HIGH (ref 11.5–15.5)
WBC: 10.6 10*3/uL — ABNORMAL HIGH (ref 4.0–10.5)

## 2017-07-03 NOTE — Progress Notes (Signed)
Pulmonary Critical Care Medicine [facility] [place]   PULMONARY SERVICE  PROGRESS NOTE  Date of Service: 07/03/2017  Melvin Kelley  [id number]  DOB: [birth year]   DOA: 05/21/2017  Referring Physician: Carron Curie, MD  HPI: Melvin Kelley is a 62 y.o. male seen for follow up of Acute on Chronic Respiratory Failure.  Patient is on T collar right now is on 30% oxygen good saturations are noted.  The patient had been weaning however later in the morning became increased respiratory distress and had to be placed back on the ventilator we will have respiratory reassess again this afternoon after dialysis is completed and will see if patient is able to go back off the ventilator.  Medications: Reviewed on Rounds  Physical Exam:  Vitals: Temperature 98.6 pulse 107 respiratory rate 30 blood pressure 114/65 saturations 100%  Ventilator Settings patient was on T collar FiO2 30%  . General: Comfortable at this time . Eyes: Grossly normal lids, irises & conjunctiva . ENT: grossly tongue is normal . Neck: no obvious mass . Cardiovascular: S1-S2 normal no gallop or rub . Respiratory: Coarse breath sounds with a few scattered rhonchi . Abdomen: Soft nontender . Skin: no rash seen on limited exam . Musculoskeletal: not rigid . Psychiatric:unable to assess . Neurologic: no seizure no involuntary movements         Labs on Admission:  Basic Metabolic Panel: Recent Labs  Lab 06/28/17 0710 06/29/17 0655 07/01/17 0630 07/03/17 0702  NA  --  133* 132* 131*  K  --  5.9* 3.9 4.7  CL  --  100* 96* 95*  CO2  --  20* 26 26  GLUCOSE  --  120* 138* 139*  BUN  --  73* 56* 48*  CREATININE  --  1.86* 1.55* 1.72*  CALCIUM  --  9.1 9.0 9.1  MG 1.9  --  1.7  --   PHOS  --  6.4* 4.7* 4.6    Liver Function Tests: Recent Labs  Lab 06/29/17 0655 07/01/17 0630 07/03/17 0702  ALBUMIN 2.3* 2.3* 2.4*   No results for input(s): LIPASE, AMYLASE in the last 168 hours. No results  for input(s): AMMONIA in the last 168 hours.  CBC: Recent Labs  Lab 06/29/17 0655 07/01/17 0630 07/03/17 0702  WBC 13.1* 9.1 10.6*  HGB 8.3* 8.6* 8.0*  HCT 25.8* 27.8* 25.6*  MCV 84.6 86.1 86.5  PLT 236 266 193    Cardiac Enzymes: No results for input(s): CKTOTAL, CKMB, CKMBINDEX, TROPONINI in the last 168 hours.  BNP (last 3 results) No results for input(s): BNP in the last 8760 hours.  ProBNP (last 3 results) No results for input(s): PROBNP in the last 8760 hours.  Radiological Exams on Admission: No results found.  Assessment/Plan Active Problems:   Cardiac arrest (HCC)   Acute on chronic respiratory failure (HCC)   DNR (do not resuscitate) discussion   CHF (congestive heart failure) (HCC)   Obstructive sleep apnea   Acute kidney injury (HCC)   1. Acute on chronic respiratory failure with hypoxia patient was placed back on the ventilator as discussed above.  Had increased work of breathing noted while on dialysis.  Will reassess again after dialysis completed and try to wean once again. 2. Chronic congestive heart failure we will continue with supportive care monitor fluid status continue with dialysis 3. Acute tubular necrosis renal failure patient is being dialyzed we will follow along 4. Obstructive sleep apnea nonissue at this stage we will continue to  monitor closely 5. Status post cardiac arrest at baseline   I have personally seen and evaluated the patient, evaluated laboratory and imaging results, formulated the assessment and plan and placed orders. The Patient requires high complexity decision making for assessment and support.  Case was discussed on Rounds with the Respiratory Therapy Staff  Yevonne Pax, MD [facility] Pulmonary Critical Care Medicine Sleep Medicine

## 2017-07-03 NOTE — Progress Notes (Signed)
[facility]  ROUNDING NOTE   Subjective:  Patient on aerosolized trach collar. Due for dialysis later today.   Objective:  Vital signs in last 24 hours:  Temperature 98.6 pulse 107 respiration 30 blood pressure 114/65  Physical Exam: General: Chronically ill-appearing  Head: Severe temporal wasting, NG in place  Eyes: Anicteric  Neck: Tracheostomy in place  Lungs:  Scattered rhonchi, normal effort  Heart: S1S2 no rubs  Abdomen:  Soft, nontender, bowel sounds present  Extremities: No peripheral edema.  Neurologic:  Resting comfortably, arousable  Skin: No lesions  Access: Right subclavain temporary dialysis catheter    Basic Metabolic Panel: Recent Labs  Lab 06/28/17 0710 06/29/17 0655 07/01/17 0630 07/03/17 0702  NA  --  133* 132* 131*  K  --  5.9* 3.9 4.7  CL  --  100* 96* 95*  CO2  --  20* 26 26  GLUCOSE  --  120* 138* 139*  BUN  --  73* 56* 48*  CREATININE  --  1.86* 1.55* 1.72*  CALCIUM  --  9.1 9.0 9.1  MG 1.9  --  1.7  --   PHOS  --  6.4* 4.7* 4.6    Liver Function Tests: Recent Labs  Lab 06/29/17 0655 07/01/17 0630 07/03/17 0702  ALBUMIN 2.3* 2.3* 2.4*   No results for input(s): LIPASE, AMYLASE in the last 168 hours. No results for input(s): AMMONIA in the last 168 hours.  CBC: Recent Labs  Lab 06/29/17 0655 07/01/17 0630 07/03/17 0702  WBC 13.1* 9.1 10.6*  HGB 8.3* 8.6* 8.0*  HCT 25.8* 27.8* 25.6*  MCV 84.6 86.1 86.5  PLT 236 266 193    Cardiac Enzymes: No results for input(s): CKTOTAL, CKMB, CKMBINDEX, TROPONINI in the last 168 hours.  BNP: Invalid input(s): POCBNP  CBG: No results for input(s): GLUCAP in the last 168 hours.  Microbiology: Results for orders placed or performed during the hospital encounter of 05/24/17  MRSA PCR Screening     Status: Abnormal   Collection Time: 05/24/17  6:01 AM  Result Value Ref Range Status   MRSA by PCR POSITIVE (A) NEGATIVE Final    Comment:        The GeneXpert MRSA Assay  (FDA approved for NASAL specimens only), is one component of a comprehensive MRSA colonization surveillance program. It is not intended to diagnose MRSA infection nor to guide or monitor treatment for MRSA infections. RESULT CALLED TO, READ BACK BY AND VERIFIED WITHAlgis Downs [facility] RN AT [phone] 05/24/17 BY A.DAVIS Performed at [facility], [address] [place], [place] [address]   Fungus culture, blood     Status: None   Collection Time: 05/24/17  6:15 AM  Result Value Ref Range Status   Specimen Description BLOOD RIGHT HAND  Final   Special Requests   Final    BOTTLES DRAWN AEROBIC ONLY Blood Culture results may not be optimal due to an inadequate volume of blood received in culture bottles   Culture   Final    NO GROWTH 7 DAYS NO FUNGUS ISOLATED Performed at [facility], [address], [place], [place] [address]    Report Status 05/31/2017 FINAL  Final  Culture, blood (routine x 2)     Status: None   Collection Time: 05/24/17  6:20 AM  Result Value Ref Range Status   Specimen Description BLOOD RIGHT HAND  Final   Special Requests   Final    BOTTLES DRAWN AEROBIC ONLY Blood  Culture results may not be optimal due to an inadequate volume of blood received in culture bottles   Culture   Final    NO GROWTH 5 DAYS Performed at [facility], [address], [place], [place] [address]    Report Status 2017/06/11 FINAL  Final  Culture, blood (routine x 2)     Status: None   Collection Time: 05/24/17  6:41 AM  Result Value Ref Range Status   Specimen Description BLOOD LEFT ANTECUBITAL  Final   Special Requests   Final    BOTTLES DRAWN AEROBIC ONLY Blood Culture results may not be optimal due to an inadequate volume of blood received in culture bottles   Culture   Final    NO GROWTH 5 DAYS Performed at [facility], [address] [place], [place] [address]    Report Status June 11, 2017 FINAL  Final  Culture, respiratory (NON-Expectorated)     Status: None    Collection Time: 05/24/17  3:25 PM  Result Value Ref Range Status   Specimen Description TRACHEAL ASPIRATE  Final   Special Requests NONE  Final   Gram Stain   Final    ABUNDANT WBC PRESENT,BOTH PMN AND MONONUCLEAR RARE SQUAMOUS EPITHELIAL CELLS PRESENT RARE GRAM POSITIVE COCCI IN PAIRS IN CHAINS RARE GRAM NEGATIVE COCCOBACILLI    Culture   Final    FEW Consistent with normal respiratory flora. Performed at [facility], [address] [place], [place] [address]    Report Status 05/27/2017 FINAL  Final    Coagulation Studies: No results for input(s): LABPROT, INR in the last 72 hours.  Urinalysis: No results for input(s): COLORURINE, LABSPEC, PHURINE, GLUCOSEU, HGBUR, BILIRUBINUR, KETONESUR, PROTEINUR, UROBILINOGEN, NITRITE, LEUKOCYTESUR in the last 72 hours.  Invalid input(s): APPERANCEUR    Imaging: No results found.   Medications:     lidocaine (PF)  Assessment/ Plan:  62 y.o. 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   1.  Severe acute renal failure. 2.  Hyponatremia. 3.  Acute respiratory failure. 4.  Anemia unspecified. 5.  Severe protein calorie malnutrition with severe emaciation. 6.  Hyperkalemia.  Plan: Patient has improved significantly with the reinitiation of dialysis.  Previously he developed worsening mental status.  He cannot generate a high creatinine secondary to very low muscle mass.  However he did have significant azotemia previously.  Patient due for dialysis today and we will plan for  dialysis on Monday as well.    LOS: 0 Jake Fuhrmann 5/17/20198:31 AM

## 2017-07-04 LAB — MAGNESIUM: MAGNESIUM: 1.8 mg/dL (ref 1.7–2.4)

## 2017-07-04 NOTE — Progress Notes (Signed)
Pulmonary Critical Care Medicine [facility] [place]   PULMONARY SERVICE  PROGRESS NOTE  Date of Service: 07/04/2017  Melvin Kelley  VEH:[id number]  DOB: 07/16/1955   DOA: 05/21/2017  Referring Physician: Carron Curie, MD  HPI: Melvin Kelley is a 62 y.o. male seen for follow up of Acute on Chronic Respiratory Failure.  He is weaning on T collar has been doing fairly well.  Currently on 20% oxygen good saturations are noted.  Medications: Reviewed on Rounds  Physical Exam:  Vitals: Temperature 100.9 pulse 117 respiratory rate 20 blood pressure 157 saturations are 100%  Ventilator Settings off the ventilator on T collar  . General: Comfortable at this time . Eyes: Grossly normal lids, irises & conjunctiva . ENT: grossly tongue is normal . Neck: no obvious mass . Cardiovascular: S1 S2 normal no gallop . Respiratory: Good air entry scattered distant rhonchi . Abdomen: soft . Skin: no rash seen on limited exam . Musculoskeletal: not rigid . Psychiatric:unable to assess . Neurologic: no seizure no involuntary movements         Labs on Admission:  Basic Metabolic Panel: Recent Labs  Lab 06/28/17 0710 06/29/17 0655 07/01/17 0630 07/03/17 0702 07/04/17 0704  NA  --  133* 132* 131*  --   K  --  5.9* 3.9 4.7  --   CL  --  100* 96* 95*  --   CO2  --  20* 26 26  --   GLUCOSE  --  120* 138* 139*  --   BUN  --  73* 56* 48*  --   CREATININE  --  1.86* 1.55* 1.72*  --   CALCIUM  --  9.1 9.0 9.1  --   MG 1.9  --  1.7  --  1.8  PHOS  --  6.4* 4.7* 4.6  --     Liver Function Tests: Recent Labs  Lab 06/29/17 0655 07/01/17 0630 07/03/17 0702  ALBUMIN 2.3* 2.3* 2.4*   No results for input(s): LIPASE, AMYLASE in the last 168 hours. No results for input(s): AMMONIA in the last 168 hours.  CBC: Recent Labs  Lab 06/29/17 0655 07/01/17 0630 07/03/17 0702  WBC 13.1* 9.1 10.6*  HGB 8.3* 8.6* 8.0*  HCT 25.8* 27.8* 25.6*  MCV 84.6 86.1 86.5  PLT 236 266 193     Cardiac Enzymes: No results for input(s): CKTOTAL, CKMB, CKMBINDEX, TROPONINI in the last 168 hours.  BNP (last 3 results) No results for input(s): BNP in the last 8760 hours.  ProBNP (last 3 results) No results for input(s): PROBNP in the last 8760 hours.  Radiological Exams on Admission: No results found.  Assessment/Plan Active Problems:   Cardiac arrest (HCC)   Acute on chronic respiratory failure (HCC)   DNR (do not resuscitate) discussion   CHF (congestive heart failure) (HCC)   Obstructive sleep apnea   Acute kidney injury (HCC)   1. Acute on chronic respiratory failure with hypoxia patient is doing fine on T collar weans which will be continued 2. Status post cardiac arrest at baseline now 3. Chronic congestive heart failure stable we will continue to follow 4. Acute renal failure nephrology following for dialysis 5. Sleep apnea nonissue at this time   I have personally seen and evaluated the patient, evaluated laboratory and imaging results, formulated the assessment and plan and placed orders. The Patient requires high complexity decision making for assessment and support.  Case was discussed on Rounds with the Respiratory Therapy Staff  Sherlynn Tourville A  Humphrey Rolls, MD [facility] Pulmonary Critical Care Medicine Sleep Medicine

## 2017-07-06 ENCOUNTER — Other Ambulatory Visit (HOSPITAL_COMMUNITY): Payer: Self-pay

## 2017-07-06 LAB — CBC
HEMATOCRIT: 23.5 % — AB (ref 39.0–52.0)
HEMOGLOBIN: 7.4 g/dL — AB (ref 13.0–17.0)
MCH: 27.1 pg (ref 26.0–34.0)
MCHC: 31.5 g/dL (ref 30.0–36.0)
MCV: 86.1 fL (ref 78.0–100.0)
Platelets: 234 10*3/uL (ref 150–400)
RBC: 2.73 MIL/uL — ABNORMAL LOW (ref 4.22–5.81)
RDW: 17.3 % — ABNORMAL HIGH (ref 11.5–15.5)
WBC: 8.2 10*3/uL (ref 4.0–10.5)

## 2017-07-06 LAB — RENAL FUNCTION PANEL
ANION GAP: 10 (ref 5–15)
Albumin: 2.2 g/dL — ABNORMAL LOW (ref 3.5–5.0)
BUN: 62 mg/dL — ABNORMAL HIGH (ref 6–20)
CO2: 25 mmol/L (ref 22–32)
Calcium: 8.7 mg/dL — ABNORMAL LOW (ref 8.9–10.3)
Chloride: 93 mmol/L — ABNORMAL LOW (ref 101–111)
Creatinine, Ser: 1.58 mg/dL — ABNORMAL HIGH (ref 0.61–1.24)
GFR calc Af Amer: 53 mL/min — ABNORMAL LOW (ref >60)
GFR, EST NON AFRICAN AMERICAN: 46 mL/min — AB (ref >60)
Glucose, Bld: 160 mg/dL — ABNORMAL HIGH (ref 65–99)
PHOSPHORUS: 5.5 mg/dL — AB (ref 2.5–4.6)
POTASSIUM: 4.8 mmol/L (ref 3.5–5.1)
Sodium: 128 mmol/L — ABNORMAL LOW (ref 135–145)

## 2017-07-06 NOTE — Progress Notes (Signed)
Pulmonary Critical Care Medicine [facility] [place]   PULMONARY SERVICE  PROGRESS NOTE  Date of Service: 07/06/2017  Melvin Kelley  SNK:[id number]  DOB: 1955-07-13   DOA: 05/21/2017  Referring Physician: Carron Curie, MD  HPI: Melvin Kelley is a 62 y.o. male seen for follow up of Acute on Chronic Respiratory Failure.  Patient remains on T collar has been doing fairly well resting on the ventilator at nighttime.  No distress is noted this morning during rounds.  Medications: Reviewed on Rounds  Physical Exam:  Vitals: Temperature 97.0 pulse 105 respiratory rate 22 blood pressure 93/78 saturations 100%  Ventilator Settings off the ventilator on T collar FiO2 28%  . General: Comfortable at this time . Eyes: Grossly normal lids, irises & conjunctiva . ENT: grossly tongue is normal . Neck: no obvious mass . Cardiovascular: S1 S2 normal no gallop . Respiratory: No rhonchi expansion is equal . Abdomen: soft . Skin: no rash seen on limited exam . Musculoskeletal: not rigid . Psychiatric:unable to assess . Neurologic: no seizure no involuntary movements         Labs on Admission:  Basic Metabolic Panel: Recent Labs  Lab 07/01/17 0630 07/03/17 0702 07/04/17 0704 07/06/17 0723  NA 132* 131*  --  128*  K 3.9 4.7  --  4.8  CL 96* 95*  --  93*  CO2 26 26  --  25  GLUCOSE 138* 139*  --  160*  BUN 56* 48*  --  62*  CREATININE 1.55* 1.72*  --  1.58*  CALCIUM 9.0 9.1  --  8.7*  MG 1.7  --  1.8  --   PHOS 4.7* 4.6  --  5.5*    Liver Function Tests: Recent Labs  Lab 07/01/17 0630 07/03/17 0702 07/06/17 0723  ALBUMIN 2.3* 2.4* 2.2*   No results for input(s): LIPASE, AMYLASE in the last 168 hours. No results for input(s): AMMONIA in the last 168 hours.  CBC: Recent Labs  Lab 07/01/17 0630 07/03/17 0702 07/06/17 0723  WBC 9.1 10.6* 8.2  HGB 8.6* 8.0* 7.4*  HCT 27.8* 25.6* 23.5*  MCV 86.1 86.5 86.1  PLT 266 193 234    Cardiac Enzymes: No results  for input(s): CKTOTAL, CKMB, CKMBINDEX, TROPONINI in the last 168 hours.  BNP (last 3 results) No results for input(s): BNP in the last 8760 hours.  ProBNP (last 3 results) No results for input(s): PROBNP in the last 8760 hours.  Radiological Exams on Admission: No results found.  Assessment/Plan Active Problems:   Cardiac arrest (HCC)   Acute on chronic respiratory failure (HCC)   CHF (congestive heart failure) (HCC)   Obstructive sleep apnea   Acute kidney injury (HCC)   1. Acute on chronic respiratory failure with hypoxia patient is tolerating T collar fairly well we will continue supportive care pulmonary toilet and follow 2. Acute on chronic congestive heart failure continue with supportive care patient is on dialysis 3. Acute renal failure on dialysis nephrology is following 4. Status post cardiac arrest unchanged 5. Obstructive sleep apnea nonissue   I have personally seen and evaluated the patient, evaluated laboratory and imaging results, formulated the assessment and plan and placed orders. The Patient requires high complexity decision making for assessment and support.  Case was discussed on Rounds with the Respiratory Therapy Staff  Yevonne Pax, MD [facility] Pulmonary Critical Care Medicine Sleep Medicine

## 2017-07-06 NOTE — Progress Notes (Signed)
[facility]  ROUNDING NOTE   Subjective:  Patient seen this this morning. Blood flow rate was 365. Patient noted to be a bit tachycardic.   Objective:  Vital signs in last 24 hours:  Temperature 97 pulse 105 respirations 22 blood pressure 93/78  Physical Exam: General: Chronically ill-appearing  Head: Severe temporal wasting, NG in place  Eyes: Anicteric  Neck: Tracheostomy in place  Lungs:  Scattered rhonchi, vent assisted  Heart: S1S2 tachycardic  Abdomen:  Soft, nontender, bowel sounds present  Extremities: No peripheral edema.  Neurologic:  Awake, alert  Skin: No lesions  Access: Right subclavain temporary dialysis catheter    Basic Metabolic Panel: Recent Labs  Lab 07/01/17 0630 07/03/17 0702 07/04/17 0704  NA 132* 131*  --   K 3.9 4.7  --   CL 96* 95*  --   CO2 26 26  --   GLUCOSE 138* 139*  --   BUN 56* 48*  --   CREATININE 1.55* 1.72*  --   CALCIUM 9.0 9.1  --   MG 1.7  --  1.8  PHOS 4.7* 4.6  --     Liver Function Tests: Recent Labs  Lab 07/01/17 0630 07/03/17 0702  ALBUMIN 2.3* 2.4*   No results for input(s): LIPASE, AMYLASE in the last 168 hours. No results for input(s): AMMONIA in the last 168 hours.  CBC: Recent Labs  Lab 07/01/17 0630 07/03/17 0702 07/06/17 0723  WBC 9.1 10.6* 8.2  HGB 8.6* 8.0* 7.4*  HCT 27.8* 25.6* 23.5*  MCV 86.1 86.5 86.1  PLT 266 193 234    Cardiac Enzymes: No results for input(s): CKTOTAL, CKMB, CKMBINDEX, TROPONINI in the last 168 hours.  BNP: Invalid input(s): POCBNP  CBG: No results for input(s): GLUCAP in the last 168 hours.  Microbiology: Results for orders placed or performed during the hospital encounter of 05/24/17  MRSA PCR Screening     Status: Abnormal   Collection Time: 05/24/17  6:01 AM  Result Value Ref Range Status   MRSA by PCR POSITIVE (A) NEGATIVE Final    Comment:        The GeneXpert MRSA Assay (FDA approved for NASAL specimens only), is one component of  a comprehensive MRSA colonization surveillance program. It is not intended to diagnose MRSA infection nor to guide or monitor treatment for MRSA infections. RESULT CALLED TO, READ BACK BY AND VERIFIED WITHAlgis Downs [facility] RN AT [phone] 05/24/17 BY A.DAVIS Performed at [facility], [address], [place], [place] [address]   Fungus culture, blood     Status: None   Collection Time: 05/24/17  6:15 AM  Result Value Ref Range Status   Specimen Description BLOOD RIGHT HAND  Final   Special Requests   Final    BOTTLES DRAWN AEROBIC ONLY Blood Culture results may not be optimal due to an inadequate volume of blood received in culture bottles   Culture   Final    NO GROWTH 7 DAYS NO FUNGUS ISOLATED Performed at [facility], [address] [place], [place] [address]    Report Status 05/31/2017 FINAL  Final  Culture, blood (routine x 2)     Status: None   Collection Time: 05/24/17  6:20 AM  Result Value Ref Range Status   Specimen Description BLOOD RIGHT HAND  Final   Special Requests   Final    BOTTLES DRAWN AEROBIC ONLY Blood Culture results may not be optimal due to an inadequate volume of  blood received in culture bottles   Culture   Final    NO GROWTH 5 DAYS Performed at [facility], [address] [place], [place] [address]    Report Status 06/12/2017 FINAL  Final  Culture, blood (routine x 2)     Status: None   Collection Time: 05/24/17  6:41 AM  Result Value Ref Range Status   Specimen Description BLOOD LEFT ANTECUBITAL  Final   Special Requests   Final    BOTTLES DRAWN AEROBIC ONLY Blood Culture results may not be optimal due to an inadequate volume of blood received in culture bottles   Culture   Final    NO GROWTH 5 DAYS Performed at [facility], [address] [place], [place] [address]    Report Status 05/20/2017 FINAL  Final  Culture, respiratory (NON-Expectorated)     Status: None   Collection Time: 05/24/17  3:25 PM  Result Value Ref Range  Status   Specimen Description TRACHEAL ASPIRATE  Final   Special Requests NONE  Final   Gram Stain   Final    ABUNDANT WBC PRESENT,BOTH PMN AND MONONUCLEAR RARE SQUAMOUS EPITHELIAL CELLS PRESENT RARE GRAM POSITIVE COCCI IN PAIRS IN CHAINS RARE GRAM NEGATIVE COCCOBACILLI    Culture   Final    FEW Consistent with normal respiratory flora. Performed at [facility], [address] [place], [place] [address]    Report Status 05/27/2017 FINAL  Final    Coagulation Studies: No results for input(s): LABPROT, INR in the last 72 hours.  Urinalysis: No results for input(s): COLORURINE, LABSPEC, PHURINE, GLUCOSEU, HGBUR, BILIRUBINUR, KETONESUR, PROTEINUR, UROBILINOGEN, NITRITE, LEUKOCYTESUR in the last 72 hours.  Invalid input(s): APPERANCEUR    Imaging: No results found.   Medications:     lidocaine (PF)  Assessment/ Plan:  62 y.o. 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   1.  Severe acute renal failure. 2.  Hyponatremia. 3.  Acute respiratory failure. 4.  Anemia unspecified. 5.  Severe protein calorie malnutrition with severe emaciation. 6.  Hyperkalemia.  Plan: Awaiting new labs from today.  His azotemia has significantly improved with reinitiation of dialysis.  Hyperkalemia also corrected previously though we are awaiting repeat serum potassium today.  His albumin remains low but stable at 2.4.  He also has ongoing anemia with a hemoglobin down to 7.4.  Consider blood transfusion for hemoglobin of 7 or less.  As  before however patient continues to have a guarded prognosis.  Currently he is on the ventilator, and respiratory therapy is maintaining the patient on the ventilator for 12 hours and off for 12 hours.    LOS: 0 Ladarien Beeks 5/20/20198:26 AM

## 2017-07-07 LAB — MAGNESIUM: Magnesium: 1.8 mg/dL (ref 1.7–2.4)

## 2017-07-07 LAB — GLUCOSE, RANDOM: Glucose, Bld: 55 mg/dL — ABNORMAL LOW (ref 65–99)

## 2017-07-07 NOTE — Progress Notes (Signed)
Pulmonary Critical Care Medicine [facility] [place]   PULMONARY SERVICE  PROGRESS NOTE  Date of Service: 07/07/2017  Ruthvik Cashner  KGM:[id number]  DOB: 1956/01/08   DOA: 06/13/2017  Referring Physician: Carron Curie, MD  HPI: Melvin Kelley is a 62 y.o. male seen for follow up of Acute on Chronic Respiratory Failure.  Remains on T collar has been tolerating PMV doing well  Medications: Reviewed on Rounds  Physical Exam:  Vitals: Temperature 97.9 pulse 105 respiratory 24 blood pressure 109/69 saturations 100%  Ventilator Settings off the ventilator on T collar  . General: Comfortable at this time . Eyes: Grossly normal lids, irises & conjunctiva . ENT: grossly tongue is normal . Neck: no obvious mass . Cardiovascular: S1 S2 normal no gallop . Respiratory: Coarse scattered rhonchi . Abdomen: soft . Skin: no rash seen on limited exam . Musculoskeletal: not rigid . Psychiatric:unable to assess . Neurologic: no seizure no involuntary movements         Labs on Admission:  Basic Metabolic Panel: Recent Labs  Lab 07/01/17 0630 07/03/17 0702 07/04/17 0704 07/06/17 0723 07/07/17 0558  NA 132* 131*  --  128*  --   K 3.9 4.7  --  4.8  --   CL 96* 95*  --  93*  --   CO2 26 26  --  25  --   GLUCOSE 138* 139*  --  160*  --   BUN 56* 48*  --  62*  --   CREATININE 1.55* 1.72*  --  1.58*  --   CALCIUM 9.0 9.1  --  8.7*  --   MG 1.7  --  1.8  --  1.8  PHOS 4.7* 4.6  --  5.5*  --     Liver Function Tests: Recent Labs  Lab 07/01/17 0630 07/03/17 0702 07/06/17 0723  ALBUMIN 2.3* 2.4* 2.2*   No results for input(s): LIPASE, AMYLASE in the last 168 hours. No results for input(s): AMMONIA in the last 168 hours.  CBC: Recent Labs  Lab 07/01/17 0630 07/03/17 0702 07/06/17 0723  WBC 9.1 10.6* 8.2  HGB 8.6* 8.0* 7.4*  HCT 27.8* 25.6* 23.5*  MCV 86.1 86.5 86.1  PLT 266 193 234    Cardiac Enzymes: No results for input(s): CKTOTAL, CKMB, CKMBINDEX,  TROPONINI in the last 168 hours.  BNP (last 3 results) No results for input(s): BNP in the last 8760 hours.  ProBNP (last 3 results) No results for input(s): PROBNP in the last 8760 hours.  Radiological Exams on Admission: Dg Abd Portable 1v  Result Date: 07/06/2017 CLINICAL DATA:  Femoral line placement EXAM: PORTABLE ABDOMEN - 1 VIEW COMPARISON:  06/24/2017, 06/15/2017, CT 06/12/2017 FINDINGS: Limited by positioning, the patient's left arm obscures the left upper quadrant. Left-sided cardiac pacing device noted. Esophageal tube tip overlies the gastric body region. Marked gaseous enlargement of what appears to be colon. Ascending catheter tip projects over the right atrial IVC junction. IMPRESSION: 1. Ascending catheter tip overlies the right atrial IVC junction 2. Marked gaseous enlargement of what appears to be colon, similar compared to prior radiograph Electronically Signed   By: Jasmine Pang M.D.   On: 07/06/2017 18:16    Assessment/Plan Active Problems:   Cardiac arrest (HCC)   Acute on chronic respiratory failure (HCC)   CHF (congestive heart failure) (HCC)   Obstructive sleep apnea   Acute kidney injury (HCC)   1. Acute on chronic respiratory failure with hypoxia continue with T collar weans 12  hours rest on the ventilator at night continue pulmonary toilet supportive care 2. Chronic congestive heart failure stable at this time we will continue supportive care 3. Acute renal failure followed by nephrology for dialysis 4. Cardiac arrest stable we will monitor 5. Sleep apnea nonissue   I have personally seen and evaluated the patient, evaluated laboratory and imaging results, formulated the assessment and plan and placed orders. The Patient requires high complexity decision making for assessment and support.  Case was discussed on Rounds with the Respiratory Therapy Staff  Yevonne Pax, MD [facility] Pulmonary Critical Care Medicine Sleep Medicine

## 2017-07-08 LAB — RENAL FUNCTION PANEL
Albumin: 2.3 g/dL — ABNORMAL LOW (ref 3.5–5.0)
Anion gap: 10 (ref 5–15)
BUN: 45 mg/dL — ABNORMAL HIGH (ref 6–20)
CALCIUM: 8.9 mg/dL (ref 8.9–10.3)
CO2: 23 mmol/L (ref 22–32)
CREATININE: 1.39 mg/dL — AB (ref 0.61–1.24)
Chloride: 98 mmol/L — ABNORMAL LOW (ref 101–111)
GFR calc Af Amer: >60 mL/min (ref >60)
GFR calc non Af Amer: 54 mL/min — ABNORMAL LOW (ref >60)
GLUCOSE: 118 mg/dL — AB (ref 65–99)
Phosphorus: 4.2 mg/dL (ref 2.5–4.6)
Potassium: 4.5 mmol/L (ref 3.5–5.1)
SODIUM: 131 mmol/L — AB (ref 135–145)

## 2017-07-08 LAB — CBC
HCT: 27.4 % — ABNORMAL LOW (ref 39.0–52.0)
Hemoglobin: 8.3 g/dL — ABNORMAL LOW (ref 13.0–17.0)
MCH: 26.7 pg (ref 26.0–34.0)
MCHC: 30.3 g/dL (ref 30.0–36.0)
MCV: 88.1 fL (ref 78.0–100.0)
PLATELETS: 224 10*3/uL (ref 150–400)
RBC: 3.11 MIL/uL — ABNORMAL LOW (ref 4.22–5.81)
RDW: 17.5 % — ABNORMAL HIGH (ref 11.5–15.5)
WBC: 7.8 10*3/uL (ref 4.0–10.5)

## 2017-07-08 NOTE — Progress Notes (Signed)
[facility]  ROUNDING NOTE   Subjective:  Patient seen at bedside. Due for dialysis later today.   Objective:  Vital signs in last 24 hours:  Temperature 97.5 pulse 101 respirations 24 blood pressure 101/54  Physical Exam: General: Chronically ill-appearing  Head: Severe temporal wasting, NG in place  Eyes: Anicteric  Neck: Tracheostomy in place  Lungs:  Scattered rhonchi, normal effort  Heart: S1S2 tachycardic  Abdomen:  Soft, nontender, bowel sounds present  Extremities: No peripheral edema.  Neurologic: Awake, alert, conversant  Skin: No lesions  Access: Right subclavain temporary dialysis catheter    Basic Metabolic Panel: Recent Labs  Lab 07/03/17 0702 07/04/17 0704 07/06/17 0723 07/07/17 0558 07/07/17 2040 07/08/17 0544  NA 131*  --  128*  --   --  131*  K 4.7  --  4.8  --   --  4.5  CL 95*  --  93*  --   --  98*  CO2 26  --  25  --   --  23  GLUCOSE 139*  --  160*  --  55* 118*  BUN 48*  --  62*  --   --  45*  CREATININE 1.72*  --  1.58*  --   --  1.39*  CALCIUM 9.1  --  8.7*  --   --  8.9  MG  --  1.8  --  1.8  --   --   PHOS 4.6  --  5.5*  --   --  4.2    Liver Function Tests: Recent Labs  Lab 07/03/17 0702 07/06/17 0723 07/08/17 0544  ALBUMIN 2.4* 2.2* 2.3*   No results for input(s): LIPASE, AMYLASE in the last 168 hours. No results for input(s): AMMONIA in the last 168 hours.  CBC: Recent Labs  Lab 07/03/17 0702 07/06/17 0723 07/08/17 0544  WBC 10.6* 8.2 7.8  HGB 8.0* 7.4* 8.3*  HCT 25.6* 23.5* 27.4*  MCV 86.5 86.1 88.1  PLT 193 234 224    Cardiac Enzymes: No results for input(s): CKTOTAL, CKMB, CKMBINDEX, TROPONINI in the last 168 hours.  BNP: Invalid input(s): POCBNP  CBG: No results for input(s): GLUCAP in the last 168 hours.  Microbiology: Results for orders placed or performed during the hospital encounter of 05/24/17  MRSA PCR Screening     Status: Abnormal   Collection Time: 05/24/17  6:01 AM  Result  Value Ref Range Status   MRSA by PCR POSITIVE (A) NEGATIVE Final    Comment:        The GeneXpert MRSA Assay (FDA approved for NASAL specimens only), is one component of a comprehensive MRSA colonization surveillance program. It is not intended to diagnose MRSA infection nor to guide or monitor treatment for MRSA infections. RESULT CALLED TO, READ BACK BY AND VERIFIED WITHAlgis Downs [facility] RN AT [phone] 05/24/17 BY A.DAVIS Performed at [facility], [address] [place], [place] [address]   Fungus culture, blood     Status: None   Collection Time: 05/24/17  6:15 AM  Result Value Ref Range Status   Specimen Description BLOOD RIGHT HAND  Final   Special Requests   Final    BOTTLES DRAWN AEROBIC ONLY Blood Culture results may not be optimal due to an inadequate volume of blood received in culture bottles   Culture   Final    NO GROWTH 7 DAYS NO FUNGUS ISOLATED Performed at [facility], [address], [place], [place] [address]  Report Status 05/31/2017 FINAL  Final  Culture, blood (routine x 2)     Status: None   Collection Time: 05/24/17  6:20 AM  Result Value Ref Range Status   Specimen Description BLOOD RIGHT HAND  Final   Special Requests   Final    BOTTLES DRAWN AEROBIC ONLY Blood Culture results may not be optimal due to an inadequate volume of blood received in culture bottles   Culture   Final    NO GROWTH 5 DAYS Performed at [facility], [address] [place], [place] [address]    Report Status 06/09/2017 FINAL  Final  Culture, blood (routine x 2)     Status: None   Collection Time: 05/24/17  6:41 AM  Result Value Ref Range Status   Specimen Description BLOOD LEFT ANTECUBITAL  Final   Special Requests   Final    BOTTLES DRAWN AEROBIC ONLY Blood Culture results may not be optimal due to an inadequate volume of blood received in culture bottles   Culture   Final    NO GROWTH 5 DAYS Performed at [facility], [address] [place], [place]  [address]    Report Status 05/31/2017 FINAL  Final  Culture, respiratory (NON-Expectorated)     Status: None   Collection Time: 05/24/17  3:25 PM  Result Value Ref Range Status   Specimen Description TRACHEAL ASPIRATE  Final   Special Requests NONE  Final   Gram Stain   Final    ABUNDANT WBC PRESENT,BOTH PMN AND MONONUCLEAR RARE SQUAMOUS EPITHELIAL CELLS PRESENT RARE GRAM POSITIVE COCCI IN PAIRS IN CHAINS RARE GRAM NEGATIVE COCCOBACILLI    Culture   Final    FEW Consistent with normal respiratory flora. Performed at [facility], [address] [place], [place] [address]    Report Status 05/27/2017 FINAL  Final    Coagulation Studies: No results for input(s): LABPROT, INR in the last 72 hours.  Urinalysis: No results for input(s): COLORURINE, LABSPEC, PHURINE, GLUCOSEU, HGBUR, BILIRUBINUR, KETONESUR, PROTEINUR, UROBILINOGEN, NITRITE, LEUKOCYTESUR in the last 72 hours.  Invalid input(s): APPERANCEUR    Imaging: Dg Abd Portable 1v  Result Date: 07/06/2017 CLINICAL DATA:  Femoral line placement EXAM: PORTABLE ABDOMEN - 1 VIEW COMPARISON:  06/24/2017, 06/15/2017, CT 06/12/2017 FINDINGS: Limited by positioning, the patient's left arm obscures the left upper quadrant. Left-sided cardiac pacing device noted. Esophageal tube tip overlies the gastric body region. Marked gaseous enlargement of what appears to be colon. Ascending catheter tip projects over the right atrial IVC junction. IMPRESSION: 1. Ascending catheter tip overlies the right atrial IVC junction 2. Marked gaseous enlargement of what appears to be colon, similar compared to prior radiograph Electronically Signed   By: Jasmine Pang M.D.   On: 07/06/2017 18:16     Medications:     lidocaine (PF)  Assessment/ Plan:  62 y.o. 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   1.  Severe acute renal failure. 2.  Hyponatremia, Na 131 3.  Acute respiratory failure, on vent QHS 4.  Anemia unspecified, Hgb 8.3 5.  Severe protein calorie malnutrition with severe emaciation. 6.  Hyperkalemia.  Plan: Patient to undergo dialysis later today.  BUN/Cr have trended down.  He cannot generate a high Cr secondary to very low muscle mass and emaciated state.  However patient is still having good urine output.  We will plan for dialysis today and hold further dialysis treatments to determine his creatinine trend.  Further plan as patient progresses.    LOS: 0 Katriona Schmierer 5/22/20193:42 PM

## 2017-07-08 NOTE — Progress Notes (Signed)
Pulmonary Critical Care Medicine [facility] [place]   PULMONARY SERVICE  PROGRESS NOTE  Date of Service: 07/08/2017  Melvin Kelley  DEY:[id number]  DOB: 09-Nov-1955   DOA: 06/13/2017  Referring Physician: Carron Curie, MD  HPI: Melvin Kelley is a 62 y.o. male seen for follow up of Acute on Chronic Respiratory Failure.  He is doing fine on T collar has been on 28% oxygen also is tolerating the PMV fairly well  Medications: Reviewed on Rounds  Physical Exam:  Vitals: Temperature 98.0 pulse 103 respiratory rate 20 blood pressure 150/74 saturations 100%  Ventilator Settings off the ventilator on T collar right now  . General: Comfortable at this time . Eyes: Grossly normal lids, irises & conjunctiva . ENT: grossly tongue is normal . Neck: no obvious mass . Cardiovascular: S1 S2 normal no gallop . Respiratory: Coarse breath sounds no rhonchi . Abdomen: soft . Skin: no rash seen on limited exam . Musculoskeletal: not rigid . Psychiatric:unable to assess . Neurologic: no seizure no involuntary movements         Labs on Admission:  Basic Metabolic Panel: Recent Labs  Lab 07/03/17 0702 07/04/17 0704 07/06/17 0723 07/07/17 0558 07/07/17 2040 07/08/17 0544  NA 131*  --  128*  --   --  131*  K 4.7  --  4.8  --   --  4.5  CL 95*  --  93*  --   --  98*  CO2 26  --  25  --   --  23  GLUCOSE 139*  --  160*  --  55* 118*  BUN 48*  --  62*  --   --  45*  CREATININE 1.72*  --  1.58*  --   --  1.39*  CALCIUM 9.1  --  8.7*  --   --  8.9  MG  --  1.8  --  1.8  --   --   PHOS 4.6  --  5.5*  --   --  4.2    Liver Function Tests: Recent Labs  Lab 07/03/17 0702 07/06/17 0723 07/08/17 0544  ALBUMIN 2.4* 2.2* 2.3*   No results for input(s): LIPASE, AMYLASE in the last 168 hours. No results for input(s): AMMONIA in the last 168 hours.  CBC: Recent Labs  Lab 07/03/17 0702 07/06/17 0723 07/08/17 0544  WBC 10.6* 8.2 7.8  HGB 8.0* 7.4* 8.3*  HCT 25.6* 23.5*  27.4*  MCV 86.5 86.1 88.1  PLT 193 234 224    Cardiac Enzymes: No results for input(s): CKTOTAL, CKMB, CKMBINDEX, TROPONINI in the last 168 hours.  BNP (last 3 results) No results for input(s): BNP in the last 8760 hours.  ProBNP (last 3 results) No results for input(s): PROBNP in the last 8760 hours.  Radiological Exams on Admission: Dg Abd Portable 1v  Result Date: 07/06/2017 CLINICAL DATA:  Femoral line placement EXAM: PORTABLE ABDOMEN - 1 VIEW COMPARISON:  06/24/2017, 06/15/2017, CT 06/12/2017 FINDINGS: Limited by positioning, the patient's left arm obscures the left upper quadrant. Left-sided cardiac pacing device noted. Esophageal tube tip overlies the gastric body region. Marked gaseous enlargement of what appears to be colon. Ascending catheter tip projects over the right atrial IVC junction. IMPRESSION: 1. Ascending catheter tip overlies the right atrial IVC junction 2. Marked gaseous enlargement of what appears to be colon, similar compared to prior radiograph Electronically Signed   By: Jasmine Pang M.D.   On: 07/06/2017 18:16    Assessment/Plan Active Problems:  Cardiac arrest (HCC)   Acute on chronic respiratory failure (HCC)   CHF (congestive heart failure) (HCC)   Obstructive sleep apnea   Acute kidney injury (HCC)   1. Acute on chronic respiratory failure with hypoxia continue with weaning on T collar and also PMV as tolerated continue pulmonary toilet secretion management supportive care 2. Congestive heart failure currently is compensated we will continue to monitor 3. Acute renal failure followed by nephrology 4. Sleep apnea unchanged 5. Cardiac arrest stable   I have personally seen and evaluated the patient, evaluated laboratory and imaging results, formulated the assessment and plan and placed orders. The Patient requires high complexity decision making for assessment and support.  Case was discussed on Rounds with the Respiratory Therapy Staff  Yevonne Pax, MD [facility] Pulmonary Critical Care Medicine Sleep Medicine

## 2017-07-09 ENCOUNTER — Other Ambulatory Visit (HOSPITAL_COMMUNITY): Payer: Self-pay

## 2017-07-09 NOTE — Progress Notes (Signed)
Pulmonary Critical Care Medicine [facility] [place]   PULMONARY SERVICE  PROGRESS NOTE  Date of Service: 07/09/2017  Melvin Kelley  OZD:[id number]  DOB: [id number]   DOA: 05/29/2017  Referring Physician: Carron Curie, MD  HPI: Melvin Kelley is a 62 y.o. male seen for follow up of Acute on Chronic Respiratory Failure.  He remains on T collar resting on the ventilator at nighttime.  Currently is on 28% FiO2  Medications: Reviewed on Rounds  Physical Exam:  Vitals: Temperature is 97.6 pulse 80 respiratory rate 28 blood pressure 112/72 saturations are 98%  Ventilator Settings off the ventilator on T collar  . General: Comfortable at this time . Eyes: Grossly normal lids, irises & conjunctiva . ENT: grossly tongue is normal . Neck: no obvious mass . Cardiovascular: S1 S2 normal no gallop . Respiratory: No rhonchi are noted expansion is equal . Abdomen: soft . Skin: no rash seen on limited exam . Musculoskeletal: not rigid . Psychiatric:unable to assess . Neurologic: no seizure no involuntary movements         Labs on Admission:  Basic Metabolic Panel: Recent Labs  Lab 07/03/17 0702 07/04/17 0704 07/06/17 0723 07/07/17 0558 07/07/17 2040 07/08/17 0544  NA 131*  --  128*  --   --  131*  K 4.7  --  4.8  --   --  4.5  CL 95*  --  93*  --   --  98*  CO2 26  --  25  --   --  23  GLUCOSE 139*  --  160*  --  55* 118*  BUN 48*  --  62*  --   --  45*  CREATININE 1.72*  --  1.58*  --   --  1.39*  CALCIUM 9.1  --  8.7*  --   --  8.9  MG  --  1.8  --  1.8  --   --   PHOS 4.6  --  5.5*  --   --  4.2    Liver Function Tests: Recent Labs  Lab 07/03/17 0702 07/06/17 0723 07/08/17 0544  ALBUMIN 2.4* 2.2* 2.3*   No results for input(s): LIPASE, AMYLASE in the last 168 hours. No results for input(s): AMMONIA in the last 168 hours.  CBC: Recent Labs  Lab 07/03/17 0702 07/06/17 0723 07/08/17 0544  WBC 10.6* 8.2 7.8  HGB 8.0* 7.4* 8.3*  HCT 25.6* 23.5*  27.4*  MCV 86.5 86.1 88.1  PLT 193 234 224    Cardiac Enzymes: No results for input(s): CKTOTAL, CKMB, CKMBINDEX, TROPONINI in the last 168 hours.  BNP (last 3 results) No results for input(s): BNP in the last 8760 hours.  ProBNP (last 3 results) No results for input(s): PROBNP in the last 8760 hours.  Radiological Exams on Admission: Dg Abd Portable 1v  Result Date: 07/06/2017 CLINICAL DATA:  Femoral line placement EXAM: PORTABLE ABDOMEN - 1 VIEW COMPARISON:  06/24/2017, 06/15/2017, CT 06/12/2017 FINDINGS: Limited by positioning, the patient's left arm obscures the left upper quadrant. Left-sided cardiac pacing device noted. Esophageal tube tip overlies the gastric body region. Marked gaseous enlargement of what appears to be colon. Ascending catheter tip projects over the right atrial IVC junction. IMPRESSION: 1. Ascending catheter tip overlies the right atrial IVC junction 2. Marked gaseous enlargement of what appears to be colon, similar compared to prior radiograph Electronically Signed   By: Jasmine Pang M.D.   On: 07/06/2017 18:16    Assessment/Plan Active Problems:  Cardiac arrest (HCC)   Acute on chronic respiratory failure (HCC)   CHF (congestive heart failure) (HCC)   Obstructive sleep apnea   Acute kidney injury (HCC)   1. Acute on chronic respiratory failure with hypoxia continue with T collar wounds titrate oxygen continue pulmonary toilet 2. Status post cardiac arrest grossly unchanged stable 3. Congestive heart failure right now appears to be compensated we will monitor 4. Acute renal failure followed by nephrology for dialysis will continue to monitor   I have personally seen and evaluated the patient, evaluated laboratory and imaging results, formulated the assessment and plan and placed orders. The Patient requires high complexity decision making for assessment and support.  Case was discussed on Rounds with the Respiratory Therapy Staff  Yevonne Pax, MD  [facility] Pulmonary Critical Care Medicine Sleep Medicine

## 2017-07-10 LAB — MAGNESIUM: Magnesium: 1.7 mg/dL (ref 1.7–2.4)

## 2017-07-10 NOTE — Progress Notes (Signed)
[facility]  ROUNDING NOTE   Subjective:  Dialysis has been placed on hold. Patient currently on aerosolized trach collar. Breathing comfortably at the moment.   Objective:  Vital signs in last 24 hours:  Temperature 98.4 pulse 126 respirations 22 blood pressure 121/59  Physical Exam: General: Chronically ill-appearing  Head: Severe temporal wasting, NG in place  Eyes: Anicteric  Neck: Tracheostomy in place  Lungs:  Scattered rhonchi, normal effort  Heart: S1S2 tachycardic  Abdomen:  Soft, nontender, bowel sounds present  Extremities: No peripheral edema.  Neurologic: Awake, alert, resting comfortably  Skin: No rash  Access: Right subclavain temporary dialysis catheter    Basic Metabolic Panel: Recent Labs  Lab 07/04/17 0704 07/06/17 0723 07/07/17 0558 07/07/17 2040 07/08/17 0544 07/10/17 0704  NA  --  128*  --   --  131*  --   K  --  4.8  --   --  4.5  --   CL  --  93*  --   --  98*  --   CO2  --  25  --   --  23  --   GLUCOSE  --  160*  --  55* 118*  --   BUN  --  62*  --   --  45*  --   CREATININE  --  1.58*  --   --  1.39*  --   CALCIUM  --  8.7*  --   --  8.9  --   MG 1.8  --  1.8  --   --  1.7  PHOS  --  5.5*  --   --  4.2  --     Liver Function Tests: Recent Labs  Lab 07/06/17 0723 07/08/17 0544  ALBUMIN 2.2* 2.3*   No results for input(s): LIPASE, AMYLASE in the last 168 hours. No results for input(s): AMMONIA in the last 168 hours.  CBC: Recent Labs  Lab 07/06/17 0723 07/08/17 0544  WBC 8.2 7.8  HGB 7.4* 8.3*  HCT 23.5* 27.4*  MCV 86.1 88.1  PLT 234 224    Cardiac Enzymes: No results for input(s): CKTOTAL, CKMB, CKMBINDEX, TROPONINI in the last 168 hours.  BNP: Invalid input(s): POCBNP  CBG: No results for input(s): GLUCAP in the last 168 hours.  Microbiology: Results for orders placed or performed during the hospital encounter of 05/24/17  MRSA PCR Screening     Status: Abnormal   Collection Time: 05/24/17  6:01  AM  Result Value Ref Range Status   MRSA by PCR POSITIVE (A) NEGATIVE Final    Comment:        The GeneXpert MRSA Assay (FDA approved for NASAL specimens only), is one component of a comprehensive MRSA colonization surveillance program. It is not intended to diagnose MRSA infection nor to guide or monitor treatment for MRSA infections. RESULT CALLED TO, READ BACK BY AND VERIFIED WITHAlgis Downs [facility] RN AT [phone] 05/24/17 BY A.DAVIS Performed at [facility], [address], [place], [place] [address]   Fungus culture, blood     Status: None   Collection Time: 05/24/17  6:15 AM  Result Value Ref Range Status   Specimen Description BLOOD RIGHT HAND  Final   Special Requests   Final    BOTTLES DRAWN AEROBIC ONLY Blood Culture results may not be optimal due to an inadequate volume of blood received in culture bottles   Culture   Final    NO GROWTH 7 DAYS NO  FUNGUS ISOLATED Performed at [facility], [address], [place], [place] [address]    Report Status 05/31/2017 FINAL  Final  Culture, blood (routine x 2)     Status: None   Collection Time: 05/24/17  6:20 AM  Result Value Ref Range Status   Specimen Description BLOOD RIGHT HAND  Final   Special Requests   Final    BOTTLES DRAWN AEROBIC ONLY Blood Culture results may not be optimal due to an inadequate volume of blood received in culture bottles   Culture   Final    NO GROWTH 5 DAYS Performed at [facility], [address], [place], [place] [address]    Report Status 05/28/2017 FINAL  Final  Culture, blood (routine x 2)     Status: None   Collection Time: 05/24/17  6:41 AM  Result Value Ref Range Status   Specimen Description BLOOD LEFT ANTECUBITAL  Final   Special Requests   Final    BOTTLES DRAWN AEROBIC ONLY Blood Culture results may not be optimal due to an inadequate volume of blood received in culture bottles   Culture   Final    NO GROWTH 5 DAYS Performed at [facility], [address],  [place], [place] [address]    Report Status 05/18/2017 FINAL  Final  Culture, respiratory (NON-Expectorated)     Status: None   Collection Time: 05/24/17  3:25 PM  Result Value Ref Range Status   Specimen Description TRACHEAL ASPIRATE  Final   Special Requests NONE  Final   Gram Stain   Final    ABUNDANT WBC PRESENT,BOTH PMN AND MONONUCLEAR RARE SQUAMOUS EPITHELIAL CELLS PRESENT RARE GRAM POSITIVE COCCI IN PAIRS IN CHAINS RARE GRAM NEGATIVE COCCOBACILLI    Culture   Final    FEW Consistent with normal respiratory flora. Performed at [facility], [address], [place], [place] [address]    Report Status 05/27/2017 FINAL  Final    Coagulation Studies: No results for input(s): LABPROT, INR in the last 72 hours.  Urinalysis: No results for input(s): COLORURINE, LABSPEC, PHURINE, GLUCOSEU, HGBUR, BILIRUBINUR, KETONESUR, PROTEINUR, UROBILINOGEN, NITRITE, LEUKOCYTESUR in the last 72 hours.  Invalid input(s): APPERANCEUR    Imaging: Dg Abd 1 View  Result Date: 07/09/2017 CLINICAL DATA:  Encounter for gastric tube placement EXAM: ABDOMEN - 1 VIEW COMPARISON:  07/06/2017 06/12/2017 FINDINGS: The tip and side port of a gastric tube are noted in the expected location of the distal esophagus. Further advancement right 10-11 cm is recommended. Marked dilatation of large bowel loops persists. Chain sutures present in the left lower quadrant with scattered suture material in the left hemiabdomen. Right renal calcification as seen on prior CT is noted measuring 8 mm within the right hemiabdomen. Inferior approach central line catheter terminates at the cavoatrial junction. ICD leads are noted overlying the right heart. Bibasilar atelectasis noted. IMPRESSION: 1. The tip and side port of a gastric tube are seen in the expected location of the distal esophagus. Further advancement by 10-11 cm is recommended. 2. Right-sided nephrolithiasis measuring 8 mm. 3. Persistent marked dilatation of large  bowel unchanged from prior. 4. Inferior approach central line catheter at the cavoatrial junction. Electronically Signed   By: Tollie Eth M.D.   On: 07/09/2017 16:20   Dg Abd Portable 1v  Result Date: 07/09/2017 CLINICAL DATA:  NG tube placement. EXAM: PORTABLE ABDOMEN - 1 VIEW COMPARISON:  07/09/2017 FINDINGS: An NG tube is identified with tip  overlying the proximal stomach. Distended loops of colon again noted. No other changes identified. IMPRESSION: NG tube with tip overlying the proximal stomach. Distended colon again noted. Electronically Signed   By: Harmon Pier M.D.   On: 07/09/2017 21:21   Dg Abd Portable 1v  Result Date: 07/09/2017 CLINICAL DATA:  Gastric tube placement EXAM: PORTABLE ABDOMEN - 1 VIEW COMPARISON:  None. FINDINGS: Abnormal position of the gastric tube now projecting over the right lung base concerning for gastric tube within right lower lobe bronchus. Atelectasis and/or scarring is seen at the right lung base. Heart and mediastinal contours are stable with ICD device projecting over the left hemithorax and leads in the right atrium and right ventricle. Inferior approach central line catheter terminates at the IVC-RA juncture. Dilated large bowel loops persist. IMPRESSION: 1. The tip of a gastric tube appears to be within right lower lobe bronchus as it projects over the right lung base. Complete withdrawal of the tube is therefore recommended. These results will be called to the ordering clinician or representative by the Radiologist Assistant, and communication documented in the PACS or zVision Dashboard. 2. No significant change otherwise in the appearance of the included lower chest and upper abdomen. Electronically Signed   By: Tollie Eth M.D.   On: 07/09/2017 18:28     Medications:     lidocaine (PF)  Assessment/ Plan:  62 y.o. 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   1.  Severe acute renal failure. 2.  Hyponatremia, Na 131 3.  Acute respiratory failure, on vent QHS 4.  Anemia unspecified, Hgb 8.3 5.  Severe protein calorie malnutrition with severe emaciation, albumin 2.3 6.  Hyperkalemia.  Plan: We have decided to hold further dialysis treatments.  His BUN and creatinine have trended down with prior dialysis treatments.  Previously when we attempted to stop dialysis his BUN started rising to a critical level.  However he is unable to generate a very high creatinine as he is significantly emaciated.  We will follow renal function into next week.  If BUN begins to climb again we will consider reinitiation.    LOS: 0 Melvin Kelley 5/24/20198:55 AM

## 2017-07-10 NOTE — Progress Notes (Signed)
Pulmonary Critical Care Medicine [facility] [place]   PULMONARY SERVICE  PROGRESS NOTE  Date of Service: 07/10/2017  Melvin Kelley  OIZ:[id number]  DOB: [birth year]   DOA: 06/14/2017  Referring Physician: Carron Curie, MD  HPI: Melvin Kelley is a 62 y.o. male seen for follow up of Acute on Chronic Respiratory Failure.  Patient right now is on T collar currently on 28% oxygen.  Good saturations are noted at this time.  Medications: Reviewed on Rounds  Physical Exam:  Vitals: Temperature 98.4 pulse 63 respiratory rate 22 blood pressure 111/59 saturations are 98%  Ventilator Settings currently off the ventilator on T collar FiO2 28%  . General: Comfortable at this time . Eyes: Grossly normal lids, irises & conjunctiva . ENT: grossly tongue is normal . Neck: no obvious mass . Cardiovascular: S1 S2 normal no gallop . Respiratory: Coarse breath sounds noted bilaterally . Abdomen: soft . Skin: no rash seen on limited exam . Musculoskeletal: not rigid . Psychiatric:unable to assess . Neurologic: no seizure no involuntary movements         Labs on Admission:  Basic Metabolic Panel: Recent Labs  Lab 07/04/17 0704 07/06/17 0723 07/07/17 0558 07/07/17 2040 07/08/17 0544 07/10/17 0704  NA  --  128*  --   --  131*  --   K  --  4.8  --   --  4.5  --   CL  --  93*  --   --  98*  --   CO2  --  25  --   --  23  --   GLUCOSE  --  160*  --  55* 118*  --   BUN  --  62*  --   --  45*  --   CREATININE  --  1.58*  --   --  1.39*  --   CALCIUM  --  8.7*  --   --  8.9  --   MG 1.8  --  1.8  --   --  1.7  PHOS  --  5.5*  --   --  4.2  --     Liver Function Tests: Recent Labs  Lab 07/06/17 0723 07/08/17 0544  ALBUMIN 2.2* 2.3*   No results for input(s): LIPASE, AMYLASE in the last 168 hours. No results for input(s): AMMONIA in the last 168 hours.  CBC: Recent Labs  Lab 07/06/17 0723 07/08/17 0544  WBC 8.2 7.8  HGB 7.4* 8.3*  HCT 23.5* 27.4*  MCV 86.1 88.1   PLT 234 224    Cardiac Enzymes: No results for input(s): CKTOTAL, CKMB, CKMBINDEX, TROPONINI in the last 168 hours.  BNP (last 3 results) No results for input(s): BNP in the last 8760 hours.  ProBNP (last 3 results) No results for input(s): PROBNP in the last 8760 hours.  Radiological Exams on Admission: Dg Abd 1 View  Result Date: 07/09/2017 CLINICAL DATA:  Encounter for gastric tube placement EXAM: ABDOMEN - 1 VIEW COMPARISON:  07/06/2017 06/12/2017 FINDINGS: The tip and side port of a gastric tube are noted in the expected location of the distal esophagus. Further advancement right 10-11 cm is recommended. Marked dilatation of large bowel loops persists. Chain sutures present in the left lower quadrant with scattered suture material in the left hemiabdomen. Right renal calcification as seen on prior CT is noted measuring 8 mm within the right hemiabdomen. Inferior approach central line catheter terminates at the cavoatrial junction. ICD leads are noted overlying the right heart.  Bibasilar atelectasis noted. IMPRESSION: 1. The tip and side port of a gastric tube are seen in the expected location of the distal esophagus. Further advancement by 10-11 cm is recommended. 2. Right-sided nephrolithiasis measuring 8 mm. 3. Persistent marked dilatation of large bowel unchanged from prior. 4. Inferior approach central line catheter at the cavoatrial junction. Electronically Signed   By: Tollie Eth M.D.   On: 07/09/2017 16:20   Dg Abd Portable 1v  Result Date: 07/09/2017 CLINICAL DATA:  NG tube placement. EXAM: PORTABLE ABDOMEN - 1 VIEW COMPARISON:  07/09/2017 FINDINGS: An NG tube is identified with tip overlying the proximal stomach. Distended loops of colon again noted. No other changes identified. IMPRESSION: NG tube with tip overlying the proximal stomach. Distended colon again noted. Electronically Signed   By: Harmon Pier M.D.   On: 07/09/2017 21:21   Dg Abd Portable 1v  Result Date:  07/09/2017 CLINICAL DATA:  Gastric tube placement EXAM: PORTABLE ABDOMEN - 1 VIEW COMPARISON:  None. FINDINGS: Abnormal position of the gastric tube now projecting over the right lung base concerning for gastric tube within right lower lobe bronchus. Atelectasis and/or scarring is seen at the right lung base. Heart and mediastinal contours are stable with ICD device projecting over the left hemithorax and leads in the right atrium and right ventricle. Inferior approach central line catheter terminates at the IVC-RA juncture. Dilated large bowel loops persist. IMPRESSION: 1. The tip of a gastric tube appears to be within right lower lobe bronchus as it projects over the right lung base. Complete withdrawal of the tube is therefore recommended. These results will be called to the ordering clinician or representative by the Radiologist Assistant, and communication documented in the PACS or zVision Dashboard. 2. No significant change otherwise in the appearance of the included lower chest and upper abdomen. Electronically Signed   By: Tollie Eth M.D.   On: 07/09/2017 18:28   Dg Abd Portable 1v  Result Date: 07/06/2017 CLINICAL DATA:  Femoral line placement EXAM: PORTABLE ABDOMEN - 1 VIEW COMPARISON:  06/24/2017, 06/15/2017, CT 06/12/2017 FINDINGS: Limited by positioning, the patient's left arm obscures the left upper quadrant. Left-sided cardiac pacing device noted. Esophageal tube tip overlies the gastric body region. Marked gaseous enlargement of what appears to be colon. Ascending catheter tip projects over the right atrial IVC junction. IMPRESSION: 1. Ascending catheter tip overlies the right atrial IVC junction 2. Marked gaseous enlargement of what appears to be colon, similar compared to prior radiograph Electronically Signed   By: Jasmine Pang M.D.   On: 07/06/2017 18:16    Assessment/Plan Active Problems:   Cardiac arrest (HCC)   Acute on chronic respiratory failure (HCC)   CHF (congestive heart  failure) (HCC)   Obstructive sleep apnea   Acute kidney injury (HCC)   1. Acute on chronic respiratory failure with hypoxia we will continue with T collar trials as ordered patient is tolerating well with a 12/12 regimen.  12 hours off 12 hours on the ventilator. 2. Acute renal failure patient is followed by nephrology for dialysis currently dialysis is on hold. 3. Congestive heart failure patient is right now compensated we will monitor 4. Status post cardiac arrest at baseline 5. Sleep apnea nonissue   I have personally seen and evaluated the patient, evaluated laboratory and imaging results, formulated the assessment and plan and placed orders. The Patient requires high complexity decision making for assessment and support.  Case was discussed on Rounds with the Respiratory Therapy Staff  Allyne Gee, MD [facility] Pulmonary Critical Care Medicine Sleep Medicine

## 2017-07-11 LAB — BASIC METABOLIC PANEL
Anion gap: 10 (ref 5–15)
BUN: 65 mg/dL — AB (ref 6–20)
CALCIUM: 9.1 mg/dL (ref 8.9–10.3)
CO2: 25 mmol/L (ref 22–32)
CREATININE: 1.56 mg/dL — AB (ref 0.61–1.24)
Chloride: 93 mmol/L — ABNORMAL LOW (ref 101–111)
GFR calc Af Amer: 54 mL/min — ABNORMAL LOW (ref >60)
GFR, EST NON AFRICAN AMERICAN: 47 mL/min — AB (ref >60)
GLUCOSE: 113 mg/dL — AB (ref 65–99)
Potassium: 5.3 mmol/L — ABNORMAL HIGH (ref 3.5–5.1)
SODIUM: 128 mmol/L — AB (ref 135–145)

## 2017-07-12 LAB — BASIC METABOLIC PANEL
Anion gap: 9 (ref 5–15)
BUN: 76 mg/dL — AB (ref 6–20)
CO2: 25 mmol/L (ref 22–32)
CREATININE: 1.66 mg/dL — AB (ref 0.61–1.24)
Calcium: 9 mg/dL (ref 8.9–10.3)
Chloride: 93 mmol/L — ABNORMAL LOW (ref 101–111)
GFR, EST AFRICAN AMERICAN: 50 mL/min — AB (ref >60)
GFR, EST NON AFRICAN AMERICAN: 43 mL/min — AB (ref >60)
Glucose, Bld: 114 mg/dL — ABNORMAL HIGH (ref 65–99)
Potassium: 5.4 mmol/L — ABNORMAL HIGH (ref 3.5–5.1)
SODIUM: 127 mmol/L — AB (ref 135–145)

## 2017-07-13 LAB — MAGNESIUM: Magnesium: 2.1 mg/dL (ref 1.7–2.4)

## 2017-07-13 NOTE — Progress Notes (Signed)
[facility]  ROUNDING NOTE   Subjective:  Azotemia appears to be worsening as his hyponatremia. Potassium also up to 5.4.    Objective:  Vital signs in last 24 hours:  Pulse 92 respiration 31 blood pressure 147/79  Physical Exam: General: Chronically ill-appearing  Head: Severe temporal wasting, NG in place  Eyes: Anicteric  Neck: Tracheostomy in place  Lungs:  Bilateral rhonchi, normal effort  Heart: S1S2 no rubs  Abdomen:  Soft, nontender, bowel sounds present  Extremities: No peripheral edema.  Neurologic: Awake, alert, resting comfortably  Skin: No rash  Access: Right subclavain temporary dialysis catheter    Basic Metabolic Panel: Recent Labs  Lab 07/07/17 0558 07/07/17 2040 07/08/17 0544 07/10/17 0704 07/11/17 1639 07/12/17 0531 07/13/17 0457  NA  --   --  131*  --  128* 127*  --   K  --   --  4.5  --  5.3* 5.4*  --   CL  --   --  98*  --  93* 93*  --   CO2  --   --  23  --  25 25  --   GLUCOSE  --  55* 118*  --  113* 114*  --   BUN  --   --  45*  --  65* 76*  --   CREATININE  --   --  1.39*  --  1.56* 1.66*  --   CALCIUM  --   --  8.9  --  9.1 9.0  --   MG 1.8  --   --  1.7  --   --  2.1  PHOS  --   --  4.2  --   --   --   --     Liver Function Tests: Recent Labs  Lab 07/08/17 0544  ALBUMIN 2.3*   No results for input(s): LIPASE, AMYLASE in the last 168 hours. No results for input(s): AMMONIA in the last 168 hours.  CBC: Recent Labs  Lab 07/08/17 0544  WBC 7.8  HGB 8.3*  HCT 27.4*  MCV 88.1  PLT 224    Cardiac Enzymes: No results for input(s): CKTOTAL, CKMB, CKMBINDEX, TROPONINI in the last 168 hours.  BNP: Invalid input(s): POCBNP  CBG: No results for input(s): GLUCAP in the last 168 hours.  Microbiology: Results for orders placed or performed during the hospital encounter of 05/24/17  MRSA PCR Screening     Status: Abnormal   Collection Time: 05/24/17  6:01 AM  Result Value Ref Range Status   MRSA by PCR  POSITIVE (A) NEGATIVE Final    Comment:        The GeneXpert MRSA Assay (FDA approved for NASAL specimens only), is one component of a comprehensive MRSA colonization surveillance program. It is not intended to diagnose MRSA infection nor to guide or monitor treatment for MRSA infections. RESULT CALLED TO, READ BACK BY AND VERIFIED WITHAlgis Downs [facility] RN AT [phone] 05/24/17 BY A.DAVIS Performed at [facility], [address] [place], [place] [address]   Fungus culture, blood     Status: None   Collection Time: 05/24/17  6:15 AM  Result Value Ref Range Status   Specimen Description BLOOD RIGHT HAND  Final   Special Requests   Final    BOTTLES DRAWN AEROBIC ONLY Blood Culture results may not be optimal due to an inadequate volume of blood received in culture bottles   Culture   Final    NO  GROWTH 7 DAYS NO FUNGUS ISOLATED Performed at [facility], [address] [place], [place] [address]    Report Status 05/31/2017 FINAL  Final  Culture, blood (routine x 2)     Status: None   Collection Time: 05/24/17  6:20 AM  Result Value Ref Range Status   Specimen Description BLOOD RIGHT HAND  Final   Special Requests   Final    BOTTLES DRAWN AEROBIC ONLY Blood Culture results may not be optimal due to an inadequate volume of blood received in culture bottles   Culture   Final    NO GROWTH 5 DAYS Performed at [facility], [address] [place], [place] [address]    Report Status 06/13/2017 FINAL  Final  Culture, blood (routine x 2)     Status: None   Collection Time: 05/24/17  6:41 AM  Result Value Ref Range Status   Specimen Description BLOOD LEFT ANTECUBITAL  Final   Special Requests   Final    BOTTLES DRAWN AEROBIC ONLY Blood Culture results may not be optimal due to an inadequate volume of blood received in culture bottles   Culture   Final    NO GROWTH 5 DAYS Performed at [facility], [address], [place], [place] [address]    Report Status 06/01/2017  FINAL  Final  Culture, respiratory (NON-Expectorated)     Status: None   Collection Time: 05/24/17  3:25 PM  Result Value Ref Range Status   Specimen Description TRACHEAL ASPIRATE  Final   Special Requests NONE  Final   Gram Stain   Final    ABUNDANT WBC PRESENT,BOTH PMN AND MONONUCLEAR RARE SQUAMOUS EPITHELIAL CELLS PRESENT RARE GRAM POSITIVE COCCI IN PAIRS IN CHAINS RARE GRAM NEGATIVE COCCOBACILLI    Culture   Final    FEW Consistent with normal respiratory flora. Performed at [facility], [address], [place], [place] [address]    Report Status 05/27/2017 FINAL  Final    Coagulation Studies: No results for input(s): LABPROT, INR in the last 72 hours.  Urinalysis: No results for input(s): COLORURINE, LABSPEC, PHURINE, GLUCOSEU, HGBUR, BILIRUBINUR, KETONESUR, PROTEINUR, UROBILINOGEN, NITRITE, LEUKOCYTESUR in the last 72 hours.  Invalid input(s): APPERANCEUR    Imaging: No results found.   Medications:     lidocaine (PF)  Assessment/ Plan:  62 y.o. 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   1.  Severe acute renal failure. 2.  Hyponatremia, Na 127 3.  Acute respiratory failure, on vent QHS 4.  Anemia unspecified, Hgb 8.3 5.  Severe protein calorie malnutrition with severe emaciation, albumin 2.3 6.  Hyperkalemia.  Plan: This is a second episode that the patient has been taken off of dialysis treatments and has deteriorating renal parameters.  BUN currently up to 76 with a creatinine 1.6.  In addition  serum potassium is risen to 5.4 and serum sodium is dropped to 127.  He cannot generate a high creatinine secondary to low muscle mass.  I have also discussed the case with nutritionist.  For now we will reinitiate dialysis and once we have corrected his electrolyte disturbances we will  consider altering the TPN formulation.    LOS: 0 Melvin Kelley 5/27/20192:12 PM

## 2017-07-13 NOTE — Progress Notes (Signed)
Pulmonary Critical Care Medicine [facility] [place]   PULMONARY SERVICE  PROGRESS NOTE  Date of Service: 07/13/2017  Drayden Cleckler  HFG:[id number]  DOB: 20-Jan-1956   DOA: 06/04/2017  Referring Physician: Carron Curie, MD  HPI: Melvin Kelley is a 62 y.o. male seen for follow up of Acute on Chronic Respiratory Failure.  Currently is on T collar patient is on 20% oxygen has been tolerating 12 hours off the ventilator and 12 hours on the ventilator.  Medications: Reviewed on Rounds  Physical Exam:  Vitals: Temperature 98.5 pulse 96 respiratory rate 22 blood pressure 110/72 saturations 98%  Ventilator Settings aerosolized T collar FiO2 28%  . General: Comfortable at this time . Eyes: Grossly normal lids, irises & conjunctiva . ENT: grossly tongue is normal . Neck: no obvious mass . Cardiovascular: S1 S2 normal no gallop . Respiratory: No rhonchi or rales expansion is equal . Abdomen: soft . Skin: no rash seen on limited exam . Musculoskeletal: not rigid . Psychiatric:unable to assess . Neurologic: no seizure no involuntary movements         Labs on Admission:  Basic Metabolic Panel: Recent Labs  Lab 07/07/17 0558 07/07/17 2040 07/08/17 0544 07/10/17 0704 07/11/17 1639 07/12/17 0531 07/13/17 0457  NA  --   --  131*  --  128* 127*  --   K  --   --  4.5  --  5.3* 5.4*  --   CL  --   --  98*  --  93* 93*  --   CO2  --   --  23  --  25 25  --   GLUCOSE  --  55* 118*  --  113* 114*  --   BUN  --   --  45*  --  65* 76*  --   CREATININE  --   --  1.39*  --  1.56* 1.66*  --   CALCIUM  --   --  8.9  --  9.1 9.0  --   MG 1.8  --   --  1.7  --   --  2.1  PHOS  --   --  4.2  --   --   --   --     Liver Function Tests: Recent Labs  Lab 07/08/17 0544  ALBUMIN 2.3*   No results for input(s): LIPASE, AMYLASE in the last 168 hours. No results for input(s): AMMONIA in the last 168 hours.  CBC: Recent Labs  Lab 07/08/17 0544  WBC 7.8  HGB 8.3*  HCT  27.4*  MCV 88.1  PLT 224    Cardiac Enzymes: No results for input(s): CKTOTAL, CKMB, CKMBINDEX, TROPONINI in the last 168 hours.  BNP (last 3 results) No results for input(s): BNP in the last 8760 hours.  ProBNP (last 3 results) No results for input(s): PROBNP in the last 8760 hours.  Radiological Exams on Admission: Dg Abd 1 View  Result Date: 07/09/2017 CLINICAL DATA:  Encounter for gastric tube placement EXAM: ABDOMEN - 1 VIEW COMPARISON:  07/06/2017 06/12/2017 FINDINGS: The tip and side port of a gastric tube are noted in the expected location of the distal esophagus. Further advancement right 10-11 cm is recommended. Marked dilatation of large bowel loops persists. Chain sutures present in the left lower quadrant with scattered suture material in the left hemiabdomen. Right renal calcification as seen on prior CT is noted measuring 8 mm within the right hemiabdomen. Inferior approach central line catheter terminates at the  cavoatrial junction. ICD leads are noted overlying the right heart. Bibasilar atelectasis noted. IMPRESSION: 1. The tip and side port of a gastric tube are seen in the expected location of the distal esophagus. Further advancement by 10-11 cm is recommended. 2. Right-sided nephrolithiasis measuring 8 mm. 3. Persistent marked dilatation of large bowel unchanged from prior. 4. Inferior approach central line catheter at the cavoatrial junction. Electronically Signed   By: Tollie Eth M.D.   On: 07/09/2017 16:20   Dg Abd Portable 1v  Result Date: 07/09/2017 CLINICAL DATA:  NG tube placement. EXAM: PORTABLE ABDOMEN - 1 VIEW COMPARISON:  07/09/2017 FINDINGS: An NG tube is identified with tip overlying the proximal stomach. Distended loops of colon again noted. No other changes identified. IMPRESSION: NG tube with tip overlying the proximal stomach. Distended colon again noted. Electronically Signed   By: Harmon Pier M.D.   On: 07/09/2017 21:21   Dg Abd Portable 1v  Result  Date: 07/09/2017 CLINICAL DATA:  Gastric tube placement EXAM: PORTABLE ABDOMEN - 1 VIEW COMPARISON:  None. FINDINGS: Abnormal position of the gastric tube now projecting over the right lung base concerning for gastric tube within right lower lobe bronchus. Atelectasis and/or scarring is seen at the right lung base. Heart and mediastinal contours are stable with ICD device projecting over the left hemithorax and leads in the right atrium and right ventricle. Inferior approach central line catheter terminates at the IVC-RA juncture. Dilated large bowel loops persist. IMPRESSION: 1. The tip of a gastric tube appears to be within right lower lobe bronchus as it projects over the right lung base. Complete withdrawal of the tube is therefore recommended. These results will be called to the ordering clinician or representative by the Radiologist Assistant, and communication documented in the PACS or zVision Dashboard. 2. No significant change otherwise in the appearance of the included lower chest and upper abdomen. Electronically Signed   By: Tollie Eth M.D.   On: 07/09/2017 18:28    Assessment/Plan Active Problems:   Cardiac arrest (HCC)   Acute on chronic respiratory failure (HCC)   CHF (congestive heart failure) (HCC)   Obstructive sleep apnea   Acute kidney injury (HCC)   1. Acute on chronic respiratory failure with hypoxia we will continue with T collar trials for 12 hours off the ventilator.  We will continue pulmonary toilet supportive care 2. Status post cardiac arrest at baseline we will continue to follow 3. CHF stable at this time compensated 4. Acute renal failure on dialysis nephrology following 5. Sleep apnea nonissue at this time   I have personally seen and evaluated the patient, evaluated laboratory and imaging results, formulated the assessment and plan and placed orders. The Patient requires high complexity decision making for assessment and support.  Case was discussed on Rounds  with the Respiratory Therapy Staff  Yevonne Pax, MD [facility] Pulmonary Critical Care Medicine Sleep Medicine

## 2017-07-14 LAB — CBC
HEMATOCRIT: 23.6 % — AB (ref 39.0–52.0)
HEMOGLOBIN: 7.6 g/dL — AB (ref 13.0–17.0)
MCH: 27 pg (ref 26.0–34.0)
MCHC: 32.2 g/dL (ref 30.0–36.0)
MCV: 83.7 fL (ref 78.0–100.0)
Platelets: 330 10*3/uL (ref 150–400)
RBC: 2.82 MIL/uL — AB (ref 4.22–5.81)
RDW: 17.1 % — ABNORMAL HIGH (ref 11.5–15.5)
WBC: 14.1 10*3/uL — ABNORMAL HIGH (ref 4.0–10.5)

## 2017-07-14 LAB — RENAL FUNCTION PANEL
ANION GAP: 9 (ref 5–15)
Albumin: 2.3 g/dL — ABNORMAL LOW (ref 3.5–5.0)
BUN: 107 mg/dL — ABNORMAL HIGH (ref 6–20)
CALCIUM: 8.8 mg/dL — AB (ref 8.9–10.3)
CO2: 26 mmol/L (ref 22–32)
Chloride: 86 mmol/L — ABNORMAL LOW (ref 101–111)
Creatinine, Ser: 1.93 mg/dL — ABNORMAL HIGH (ref 0.61–1.24)
GFR calc non Af Amer: 36 mL/min — ABNORMAL LOW (ref >60)
GFR, EST AFRICAN AMERICAN: 42 mL/min — AB (ref >60)
GLUCOSE: 109 mg/dL — AB (ref 65–99)
POTASSIUM: 5.3 mmol/L — AB (ref 3.5–5.1)
Phosphorus: 7.1 mg/dL — ABNORMAL HIGH (ref 2.5–4.6)
SODIUM: 121 mmol/L — AB (ref 135–145)

## 2017-07-14 NOTE — Progress Notes (Signed)
Pulmonary Critical Care Medicine [facility] [place]   PULMONARY SERVICE  PROGRESS NOTE  Date of Service: 07/14/2017  Melvin Kelley  OIL:[id number]  DOB: March 15, 1955   DOA: 05/19/2017  Referring Physician: Carron Curie, MD  HPI: Melvin Kelley is a 62 y.o. male seen for follow up of Acute on Chronic Respiratory Failure.  Comfortable without distress at this time.  Patient is on T collar has been on 28% oxygen saturations are good  Medications: Reviewed on Rounds  Physical Exam:  Vitals: Temperature 96.6 pulse 88 respiratory rate 22 blood pressure 100/54 saturations 96%.   Ventilator Settings off the ventilator right now on 10 collar  . General: Comfortable at this time . Eyes: Grossly normal lids, irises & conjunctiva . ENT: grossly tongue is normal . Neck: no obvious mass . Cardiovascular: S1 S2 normal no gallop . Respiratory: Coarse breath sounds without distress . Abdomen: soft . Skin: no rash seen on limited exam . Musculoskeletal: not rigid . Psychiatric:unable to assess . Neurologic: no seizure no involuntary movements         Labs on Admission:  Basic Metabolic Panel: Recent Labs  Lab 07/07/17 2040 07/08/17 0544 07/10/17 0704 07/11/17 1639 07/12/17 0531 07/13/17 0457 07/14/17 0818  NA  --  131*  --  128* 127*  --  121*  K  --  4.5  --  5.3* 5.4*  --  5.3*  CL  --  98*  --  93* 93*  --  86*  CO2  --  23  --  25 25  --  26  GLUCOSE 55* 118*  --  113* 114*  --  109*  BUN  --  45*  --  65* 76*  --  107*  CREATININE  --  1.39*  --  1.56* 1.66*  --  1.93*  CALCIUM  --  8.9  --  9.1 9.0  --  8.8*  MG  --   --  1.7  --   --  2.1  --   PHOS  --  4.2  --   --   --   --  7.1*    Liver Function Tests: Recent Labs  Lab 07/08/17 0544 07/14/17 0818  ALBUMIN 2.3* 2.3*   No results for input(s): LIPASE, AMYLASE in the last 168 hours. No results for input(s): AMMONIA in the last 168 hours.  CBC: Recent Labs  Lab 07/08/17 0544 07/14/17 0818   WBC 7.8 14.1*  HGB 8.3* 7.6*  HCT 27.4* 23.6*  MCV 88.1 83.7  PLT 224 330    Cardiac Enzymes: No results for input(s): CKTOTAL, CKMB, CKMBINDEX, TROPONINI in the last 168 hours.  BNP (last 3 results) No results for input(s): BNP in the last 8760 hours.  ProBNP (last 3 results) No results for input(s): PROBNP in the last 8760 hours.  Radiological Exams on Admission: No results found.  Assessment/Plan Active Problems:   Cardiac arrest (HCC)   Acute on chronic respiratory failure (HCC)   CHF (congestive heart failure) (HCC)   Obstructive sleep apnea   Acute kidney injury (HCC)   1. Acute on chronic respiratory failure with hypoxia we will continue with T collar titrate oxygen continue pulmonary toilet supportive care secretions are fair to moderate. 2. Congestive cardiomyopathy congestive heart failure we will continue present therapy supportive care monitor fluid status 3. Acute renal failure followed by nephrology for dialysis. 4. Sleep apnea nonissue at this stage   I have personally seen and evaluated the patient,  evaluated laboratory and imaging results, formulated the assessment and plan and placed orders. The Patient requires high complexity decision making for assessment and support.  Case was discussed on Rounds with the Respiratory Therapy Staff  Allyne Gee, MD [facility] Pulmonary Critical Care Medicine Sleep Medicine

## 2017-07-15 NOTE — Progress Notes (Signed)
[facility]  ROUNDING NOTE   Subjective:  Patient seen at bedside. Due for dialysis again tomorrow. Did receive dialysis yesterday.  Objective:  Vital signs in last 24 hours:  Temperature 96.9 pulse 86 respiration 17 blood pressure 109/64  Physical Exam: General: Chronically ill-appearing  Head: Severe temporal wasting, NG in place  Eyes: Anicteric  Neck: Tracheostomy in place  Lungs:  Bilateral rhonchi, normal effort  Heart: S1S2 no rubs  Abdomen:  Soft, nontender, bowel sounds present  Extremities: No peripheral edema.  Neurologic: Awake, alert, resting comfortably  Skin: No rash  Access: Right subclavain temporary dialysis catheter    Basic Metabolic Panel: Recent Labs  Lab 07/10/17 0704 07/11/17 1639 07/12/17 0531 07/13/17 0457 07/14/17 0818  NA  --  128* 127*  --  121*  K  --  5.3* 5.4*  --  5.3*  CL  --  93* 93*  --  86*  CO2  --  25 25  --  26  GLUCOSE  --  113* 114*  --  109*  BUN  --  65* 76*  --  107*  CREATININE  --  1.56* 1.66*  --  1.93*  CALCIUM  --  9.1 9.0  --  8.8*  MG 1.7  --   --  2.1  --   PHOS  --   --   --   --  7.1*    Liver Function Tests: Recent Labs  Lab 07/14/17 0818  ALBUMIN 2.3*   No results for input(s): LIPASE, AMYLASE in the last 168 hours. No results for input(s): AMMONIA in the last 168 hours.  CBC: Recent Labs  Lab 07/14/17 0818  WBC 14.1*  HGB 7.6*  HCT 23.6*  MCV 83.7  PLT 330    Cardiac Enzymes: No results for input(s): CKTOTAL, CKMB, CKMBINDEX, TROPONINI in the last 168 hours.  BNP: Invalid input(s): POCBNP  CBG: No results for input(s): GLUCAP in the last 168 hours.  Microbiology: Results for orders placed or performed during the hospital encounter of 05/24/17  MRSA PCR Screening     Status: Abnormal   Collection Time: 05/24/17  6:01 AM  Result Value Ref Range Status   MRSA by PCR POSITIVE (A) NEGATIVE Final    Comment:        The GeneXpert MRSA Assay (FDA approved for NASAL  specimens only), is one component of a comprehensive MRSA colonization surveillance program. It is not intended to diagnose MRSA infection nor to guide or monitor treatment for MRSA infections. RESULT CALLED TO, READ BACK BY AND VERIFIED WITHAlgis Downs [facility] RN AT [phone] 05/24/17 BY A.DAVIS Performed at [facility], [address], [place], [place] [address]   Fungus culture, blood     Status: None   Collection Time: 05/24/17  6:15 AM  Result Value Ref Range Status   Specimen Description BLOOD RIGHT HAND  Final   Special Requests   Final    BOTTLES DRAWN AEROBIC ONLY Blood Culture results may not be optimal due to an inadequate volume of blood received in culture bottles   Culture   Final    NO GROWTH 7 DAYS NO FUNGUS ISOLATED Performed at [facility], [address], [place], [place] [address]    Report Status 05/31/2017 FINAL  Final  Culture, blood (routine x 2)     Status: None   Collection Time: 05/24/17  6:20 AM  Result Value Ref Range Status   Specimen Description BLOOD RIGHT  HAND  Final   Special Requests   Final    BOTTLES DRAWN AEROBIC ONLY Blood Culture results may not be optimal due to an inadequate volume of blood received in culture bottles   Culture   Final    NO GROWTH 5 DAYS Performed at [facility], [address] [place], [place] [address]    Report Status 05/30/2017 FINAL  Final  Culture, blood (routine x 2)     Status: None   Collection Time: 05/24/17  6:41 AM  Result Value Ref Range Status   Specimen Description BLOOD LEFT ANTECUBITAL  Final   Special Requests   Final    BOTTLES DRAWN AEROBIC ONLY Blood Culture results may not be optimal due to an inadequate volume of blood received in culture bottles   Culture   Final    NO GROWTH 5 DAYS Performed at [facility], [address] [place], [place] [address]    Report Status 06/10/2017 FINAL  Final  Culture, respiratory (NON-Expectorated)     Status: None   Collection Time: 05/24/17   3:25 PM  Result Value Ref Range Status   Specimen Description TRACHEAL ASPIRATE  Final   Special Requests NONE  Final   Gram Stain   Final    ABUNDANT WBC PRESENT,BOTH PMN AND MONONUCLEAR RARE SQUAMOUS EPITHELIAL CELLS PRESENT RARE GRAM POSITIVE COCCI IN PAIRS IN CHAINS RARE GRAM NEGATIVE COCCOBACILLI    Culture   Final    FEW Consistent with normal respiratory flora. Performed at [facility], [address], [place], [place] [address]    Report Status 05/27/2017 FINAL  Final    Coagulation Studies: No results for input(s): LABPROT, INR in the last 72 hours.  Urinalysis: No results for input(s): COLORURINE, LABSPEC, PHURINE, GLUCOSEU, HGBUR, BILIRUBINUR, KETONESUR, PROTEINUR, UROBILINOGEN, NITRITE, LEUKOCYTESUR in the last 72 hours.  Invalid input(s): APPERANCEUR    Imaging: No results found.   Medications:     lidocaine (PF)  Assessment/ Plan:  62 y.o. 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   1.  Severe acute renal failure. 2.  Hyponatremia, Na 121 3.  Acute respiratory failure, on vent QHS 4.  Anemia unspecified, Hgb down to 7.6 5.  Severe protein calorie malnutrition with severe emaciation, albumin 2.3 6.  Hyperkalemia.  Plan: Patient did undergo hemodialysis yesterday.  We will plan for dialysis again tomorrow to help treat azotemia.  Potassium should also be corrected with dialysis treatment as will hyponatremia.  Suspect that the patient's TPN is playing a large role in worsening azotemia.  However  given his severe malnutrition and history of short bowel syndrome he requires TPN administration.  Continue to monitor CBC as hemoglobin is declining.    LOS: 0 Eavan Gonterman 5/29/20195:01 PM

## 2017-07-15 NOTE — Progress Notes (Signed)
Pulmonary Critical Care Medicine [facility] [place]   PULMONARY SERVICE  PROGRESS NOTE  Date of Service: 07/15/2017  Tilak Brietzke  HYI:[id number]  DOB: 23-Oct-1955   DOA: 2017/06/27  Referring Physician: Carron Curie, MD  HPI: Luca Bartik is a 62 y.o. male seen for follow up of Acute on Chronic Respiratory Failure.  Continues on T collar during the daytime resting on the ventilator at nighttime.  Basically is doing 12 hours off the vent without any issues.  He is not able to tolerate any further time off of the ventilator  Medications: Reviewed on Rounds  Physical Exam:  Vitals: Temperature 98.0 pulse 83 respiratory rate 26 blood pressure 95/51 saturations 96%  Ventilator Settings aerosolized T collar FiO2 28%  . General: Comfortable at this time . Eyes: Grossly normal lids, irises & conjunctiva . ENT: grossly tongue is normal . Neck: no obvious mass . Cardiovascular: S1 S2 normal no gallop . Respiratory: Coarse breath sounds no rhonchi . Abdomen: soft . Skin: no rash seen on limited exam . Musculoskeletal: not rigid . Psychiatric:unable to assess . Neurologic: no seizure no involuntary movements         Labs on Admission:  Basic Metabolic Panel: Recent Labs  Lab 07/10/17 0704 07/11/17 1639 07/12/17 0531 07/13/17 0457 07/14/17 0818  NA  --  128* 127*  --  121*  K  --  5.3* 5.4*  --  5.3*  CL  --  93* 93*  --  86*  CO2  --  25 25  --  26  GLUCOSE  --  113* 114*  --  109*  BUN  --  65* 76*  --  107*  CREATININE  --  1.56* 1.66*  --  1.93*  CALCIUM  --  9.1 9.0  --  8.8*  MG 1.7  --   --  2.1  --   PHOS  --   --   --   --  7.1*    Liver Function Tests: Recent Labs  Lab 07/14/17 0818  ALBUMIN 2.3*   No results for input(s): LIPASE, AMYLASE in the last 168 hours. No results for input(s): AMMONIA in the last 168 hours.  CBC: Recent Labs  Lab 07/14/17 0818  WBC 14.1*  HGB 7.6*  HCT 23.6*  MCV 83.7  PLT 330    Cardiac Enzymes: No  results for input(s): CKTOTAL, CKMB, CKMBINDEX, TROPONINI in the last 168 hours.  BNP (last 3 results) No results for input(s): BNP in the last 8760 hours.  ProBNP (last 3 results) No results for input(s): PROBNP in the last 8760 hours.  Radiological Exams on Admission: No results found.  Assessment/Plan Active Problems:   Cardiac arrest (HCC)   Acute on chronic respiratory failure (HCC)   CHF (congestive heart failure) (HCC)   Obstructive sleep apnea   Acute kidney injury (HCC)   1. Acute on chronic respiratory failure with hypoxia we will continue to wean on T collar during the daytime rest on the vent at night. 2. Congestive heart failure right now is compensated continue present management. 3. Acute renal failure followed by nephrology for dialysis will continue present therapy 4. Sleep apnea nonissue 5. Status post cardiac arrest grossly unchanged   I have personally seen and evaluated the patient, evaluated laboratory and imaging results, formulated the assessment and plan and placed orders. The Patient requires high complexity decision making for assessment and support.  Case was discussed on Rounds with the Respiratory Therapy Staff  [facility]  Richardson Dopp, MD [facility] Pulmonary Critical Care Medicine Sleep Medicine

## 2017-07-16 LAB — RENAL FUNCTION PANEL
ALBUMIN: 2.5 g/dL — AB (ref 3.5–5.0)
ANION GAP: 6 (ref 5–15)
BUN: 63 mg/dL — ABNORMAL HIGH (ref 6–20)
CO2: 25 mmol/L (ref 22–32)
Calcium: 9.1 mg/dL (ref 8.9–10.3)
Chloride: 95 mmol/L — ABNORMAL LOW (ref 101–111)
Creatinine, Ser: 1.47 mg/dL — ABNORMAL HIGH (ref 0.61–1.24)
GFR, EST AFRICAN AMERICAN: 58 mL/min — AB (ref >60)
GFR, EST NON AFRICAN AMERICAN: 50 mL/min — AB (ref >60)
Glucose, Bld: 92 mg/dL (ref 65–99)
PHOSPHORUS: 4.3 mg/dL (ref 2.5–4.6)
POTASSIUM: 5.1 mmol/L (ref 3.5–5.1)
Sodium: 126 mmol/L — ABNORMAL LOW (ref 135–145)

## 2017-07-16 LAB — CBC
HEMATOCRIT: 23.6 % — AB (ref 39.0–52.0)
HEMOGLOBIN: 7.5 g/dL — AB (ref 13.0–17.0)
MCH: 27.1 pg (ref 26.0–34.0)
MCHC: 31.8 g/dL (ref 30.0–36.0)
MCV: 85.2 fL (ref 78.0–100.0)
Platelets: 209 10*3/uL (ref 150–400)
RBC: 2.77 MIL/uL — ABNORMAL LOW (ref 4.22–5.81)
RDW: 16.9 % — ABNORMAL HIGH (ref 11.5–15.5)
WBC: 8.9 10*3/uL (ref 4.0–10.5)

## 2017-07-16 LAB — MAGNESIUM: Magnesium: 2 mg/dL (ref 1.7–2.4)

## 2017-07-16 NOTE — Progress Notes (Signed)
Pulmonary Critical Care Medicine [facility] [place]   PULMONARY SERVICE  PROGRESS NOTE  Date of Service: 07/16/2017  Melvin Kelley  QJF:[id number]  DOB: 1955-09-06   DOA: 2017/06/06  Referring Physician: Carron Curie, MD  HPI: Melvin Kelley is a 62 y.o. male seen for follow up of Acute on Chronic Respiratory Failure.  Currently patient is on T collar has been doing fine during the daytime resting on the ventilator at nighttime  Medications: Reviewed on Rounds  Physical Exam:  Vitals: Temperature 96.7 pulse 90 respiratory rate 11 blood pressure 96/53 saturations 100%  Ventilator Settings off the ventilator on T collar  . General: Comfortable at this time . Eyes: Grossly normal lids, irises & conjunctiva . ENT: grossly tongue is normal . Neck: no obvious mass . Cardiovascular: S1 S2 normal no gallop . Respiratory: No rhonchi expansion is equal . Abdomen: soft . Skin: no rash seen on limited exam . Musculoskeletal: not rigid . Psychiatric:unable to assess . Neurologic: no seizure no involuntary movements         Labs on Admission:  Basic Metabolic Panel: Recent Labs  Lab 07/10/17 0704 07/11/17 1639 07/12/17 0531 07/13/17 0457 07/14/17 0818 07/16/17 0617  NA  --  128* 127*  --  121* 126*  K  --  5.3* 5.4*  --  5.3* 5.1  CL  --  93* 93*  --  86* 95*  CO2  --  25 25  --  26 25  GLUCOSE  --  113* 114*  --  109* 92  BUN  --  65* 76*  --  107* 63*  CREATININE  --  1.56* 1.66*  --  1.93* 1.47*  CALCIUM  --  9.1 9.0  --  8.8* 9.1  MG 1.7  --   --  2.1  --  2.0  PHOS  --   --   --   --  7.1* 4.3    Liver Function Tests: Recent Labs  Lab 07/14/17 0818 07/16/17 0617  ALBUMIN 2.3* 2.5*   No results for input(s): LIPASE, AMYLASE in the last 168 hours. No results for input(s): AMMONIA in the last 168 hours.  CBC: Recent Labs  Lab 07/14/17 0818 07/16/17 0617  WBC 14.1* 8.9  HGB 7.6* 7.5*  HCT 23.6* 23.6*  MCV 83.7 85.2  PLT 330 209     Cardiac Enzymes: No results for input(s): CKTOTAL, CKMB, CKMBINDEX, TROPONINI in the last 168 hours.  BNP (last 3 results) No results for input(s): BNP in the last 8760 hours.  ProBNP (last 3 results) No results for input(s): PROBNP in the last 8760 hours.  Radiological Exams on Admission: No results found.  Assessment/Plan Active Problems:   Cardiac arrest (HCC)   Acute on chronic respiratory failure (HCC)   CHF (congestive heart failure) (HCC)   Obstructive sleep apnea   Acute kidney injury (HCC)   1. Acute on chronic respiratory failure with hypoxia we will continue with T collar trials as ordered continue secretion management pulmonary toilet 2. Congestive heart failure right now is compensated we will continue present management. 3. Acute renal failure on dialysis nephrology is following 4. Sleep apnea nonissue at this time 5. Status post cardiac arrest remains at baseline   I have personally seen and evaluated the patient, evaluated laboratory and imaging results, formulated the assessment and plan and placed orders. The Patient requires high complexity decision making for assessment and support.  Case was discussed on Rounds with the Respiratory Therapy Staff  Allyne Gee, MD [facility] Pulmonary Critical Care Medicine Sleep Medicine

## 2017-07-17 NOTE — Progress Notes (Signed)
Pulmonary Critical Care Medicine [facility] [place]   PULMONARY SERVICE  PROGRESS NOTE  Date of Service: 07/17/2017  Melvin Kelley  LKG:[id number]  DOB: Dec 05, 1955   DOA: 05/26/2017  Referring Physician: Carron Curie, MD  HPI: Melvin Kelley is a 62 y.o. male seen for follow up of Acute on Chronic Respiratory Failure.  Currently is on T collar doing fairly well has been on 20% oxygen.  Has been off the ventilator during the day resting at night  Medications: Reviewed on Rounds  Physical Exam:  Vitals: Temperature 98.4 pulse 89 respiratory rate 25 blood pressure 120/56 saturations 94%  Ventilator Settings off the ventilator on T collar currently 28% FiO2 saturations are 94%  . General: Comfortable at this time . Eyes: Grossly normal lids, irises & conjunctiva . ENT: grossly tongue is normal . Neck: no obvious mass . Cardiovascular: S1 S2 normal no gallop . Respiratory: No rhonchi expansion is equal . Abdomen: soft . Skin: no rash seen on limited exam . Musculoskeletal: not rigid . Psychiatric:unable to assess . Neurologic: no seizure no involuntary movements         Labs on Admission:  Basic Metabolic Panel: Recent Labs  Lab 07/11/17 1639 07/12/17 0531 07/13/17 0457 07/14/17 0818 07/16/17 0617  NA 128* 127*  --  121* 126*  K 5.3* 5.4*  --  5.3* 5.1  CL 93* 93*  --  86* 95*  CO2 25 25  --  26 25  GLUCOSE 113* 114*  --  109* 92  BUN 65* 76*  --  107* 63*  CREATININE 1.56* 1.66*  --  1.93* 1.47*  CALCIUM 9.1 9.0  --  8.8* 9.1  MG  --   --  2.1  --  2.0  PHOS  --   --   --  7.1* 4.3    Liver Function Tests: Recent Labs  Lab 07/14/17 0818 07/16/17 0617  ALBUMIN 2.3* 2.5*   No results for input(s): LIPASE, AMYLASE in the last 168 hours. No results for input(s): AMMONIA in the last 168 hours.  CBC: Recent Labs  Lab 07/14/17 0818 07/16/17 0617  WBC 14.1* 8.9  HGB 7.6* 7.5*  HCT 23.6* 23.6*  MCV 83.7 85.2  PLT 330 209    Cardiac  Enzymes: No results for input(s): CKTOTAL, CKMB, CKMBINDEX, TROPONINI in the last 168 hours.  BNP (last 3 results) No results for input(s): BNP in the last 8760 hours.  ProBNP (last 3 results) No results for input(s): PROBNP in the last 8760 hours.  Radiological Exams on Admission: No results found.  Assessment/Plan Active Problems:   Cardiac arrest (HCC)   Acute on chronic respiratory failure (HCC)   CHF (congestive heart failure) (HCC)   Obstructive sleep apnea   Acute kidney injury (HCC)   1. Acute on chronic respiratory failure with hypoxia we will continue with supportive care titrate oxygen continue pulmonary toilet secretion management.  Rest on the ventilator at nighttime. 2. Congestive heart failure at baseline we will continue present management. 3. Acute renal failure followed by nephrology for dialysis. 4. Obstructive sleep apnea at baseline we will continue present therapy 5. Cardiac arrest stable at this time we will continue to follow   I have personally seen and evaluated the patient, evaluated laboratory and imaging results, formulated the assessment and plan and placed orders. The Patient requires high complexity decision making for assessment and support.  Case was discussed on Rounds with the Respiratory Therapy Staff  Yevonne Pax, MD [facility]  Pulmonary Critical Care Medicine Sleep Medicine

## 2017-07-17 NOTE — Progress Notes (Signed)
[facility]  ROUNDING NOTE   Subjective:  Patient seen at bedside. He remains on aerosolized trach collar. Patient due for dialysis again tomorrow.  Objective:  Vital signs in last 24 hours:  Temperature 97 pulse 89 respirations 24 blood pressure 98/57  Physical Exam: General: Chronically ill-appearing  Head: Severe temporal wasting, NG in place  Eyes: Anicteric  Neck: Tracheostomy in place  Lungs:  Bilateral rhonchi, normal effort  Heart: S1S2 no rubs  Abdomen:  Soft, nontender, bowel sounds present  Extremities: No peripheral edema.  Neurologic: Awake, alert  Skin: No rash  Access: Right subclavain temporary dialysis catheter    Basic Metabolic Panel: Recent Labs  Lab 07/11/17 1639 07/12/17 0531 07/13/17 0457 07/14/17 0818 07/16/17 0617  NA 128* 127*  --  121* 126*  K 5.3* 5.4*  --  5.3* 5.1  CL 93* 93*  --  86* 95*  CO2 25 25  --  26 25  GLUCOSE 113* 114*  --  109* 92  BUN 65* 76*  --  107* 63*  CREATININE 1.56* 1.66*  --  1.93* 1.47*  CALCIUM 9.1 9.0  --  8.8* 9.1  MG  --   --  2.1  --  2.0  PHOS  --   --   --  7.1* 4.3    Liver Function Tests: Recent Labs  Lab 07/14/17 0818 07/16/17 0617  ALBUMIN 2.3* 2.5*   No results for input(s): LIPASE, AMYLASE in the last 168 hours. No results for input(s): AMMONIA in the last 168 hours.  CBC: Recent Labs  Lab 07/14/17 0818 07/16/17 0617  WBC 14.1* 8.9  HGB 7.6* 7.5*  HCT 23.6* 23.6*  MCV 83.7 85.2  PLT 330 209    Cardiac Enzymes: No results for input(s): CKTOTAL, CKMB, CKMBINDEX, TROPONINI in the last 168 hours.  BNP: Invalid input(s): POCBNP  CBG: No results for input(s): GLUCAP in the last 168 hours.  Microbiology: Results for orders placed or performed during the hospital encounter of 05/24/17  MRSA PCR Screening     Status: Abnormal   Collection Time: 05/24/17  6:01 AM  Result Value Ref Range Status   MRSA by PCR POSITIVE (A) NEGATIVE Final    Comment:        The  GeneXpert MRSA Assay (FDA approved for NASAL specimens only), is one component of a comprehensive MRSA colonization surveillance program. It is not intended to diagnose MRSA infection nor to guide or monitor treatment for MRSA infections. RESULT CALLED TO, READ BACK BY AND VERIFIED WITHAlgis Downs [facility] RN AT [phone] 05/24/17 BY A.DAVIS Performed at [facility], [address], [place], [place] [address]   Fungus culture, blood     Status: None   Collection Time: 05/24/17  6:15 AM  Result Value Ref Range Status   Specimen Description BLOOD RIGHT HAND  Final   Special Requests   Final    BOTTLES DRAWN AEROBIC ONLY Blood Culture results may not be optimal due to an inadequate volume of blood received in culture bottles   Culture   Final    NO GROWTH 7 DAYS NO FUNGUS ISOLATED Performed at [facility], [address] [place], [place] [address]    Report Status 05/31/2017 FINAL  Final  Culture, blood (routine x 2)     Status: None   Collection Time: 05/24/17  6:20 AM  Result Value Ref Range Status   Specimen Description BLOOD RIGHT HAND  Final   Special Requests  Final    BOTTLES DRAWN AEROBIC ONLY Blood Culture results may not be optimal due to an inadequate volume of blood received in culture bottles   Culture   Final    NO GROWTH 5 DAYS Performed at [facility], [address], [place], [place] [address]    Report Status 06/07/2017 FINAL  Final  Culture, blood (routine x 2)     Status: None   Collection Time: 05/24/17  6:41 AM  Result Value Ref Range Status   Specimen Description BLOOD LEFT ANTECUBITAL  Final   Special Requests   Final    BOTTLES DRAWN AEROBIC ONLY Blood Culture results may not be optimal due to an inadequate volume of blood received in culture bottles   Culture   Final    NO GROWTH 5 DAYS Performed at [facility], [address], [place], [place] [address]    Report Status 05/24/2017 FINAL  Final  Culture, respiratory (NON-Expectorated)      Status: None   Collection Time: 05/24/17  3:25 PM  Result Value Ref Range Status   Specimen Description TRACHEAL ASPIRATE  Final   Special Requests NONE  Final   Gram Stain   Final    ABUNDANT WBC PRESENT,BOTH PMN AND MONONUCLEAR RARE SQUAMOUS EPITHELIAL CELLS PRESENT RARE GRAM POSITIVE COCCI IN PAIRS IN CHAINS RARE GRAM NEGATIVE COCCOBACILLI    Culture   Final    FEW Consistent with normal respiratory flora. Performed at [facility], [address] [place], [place] [address]    Report Status 05/27/2017 FINAL  Final    Coagulation Studies: No results for input(s): LABPROT, INR in the last 72 hours.  Urinalysis: No results for input(s): COLORURINE, LABSPEC, PHURINE, GLUCOSEU, HGBUR, BILIRUBINUR, KETONESUR, PROTEINUR, UROBILINOGEN, NITRITE, LEUKOCYTESUR in the last 72 hours.  Invalid input(s): APPERANCEUR    Imaging: No results found.   Medications:     lidocaine (PF)  Assessment/ Plan:  62 y.o. 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   1.  Severe acute renal failure. 2.  Hyponatremia, Na 126 3.  Acute respiratory failure, on vent QHS 4.  Anemia unspecified, Hgb down to 7.5 5.  Severe protein calorie malnutrition with severe emaciation, albumin 2.5 6.  Hyperkalemia.  Plan: Metabolic parameters have improved.  Serum sodium up to 126 and potassium down to 5.1.  Azotemia has also improved with reinitiation of dialysis.  We will plan for dialysis again tomorrow.  We have tried taking the patient off of  hemodialysis twice and each time he has had worsening metabolic parameters.  He cannot generate a high creatinine secondary to very low muscle mass as before.  Overall patient continues to have an extremely guarded long-term prognosis.    LOS: 0 Callen Vancuren 5/31/20193:52 PM

## 2017-07-18 LAB — RENAL FUNCTION PANEL
ALBUMIN: 2.6 g/dL — AB (ref 3.5–5.0)
Anion gap: 8 (ref 5–15)
BUN: 53 mg/dL — AB (ref 6–20)
CO2: 26 mmol/L (ref 22–32)
Calcium: 9.6 mg/dL (ref 8.9–10.3)
Chloride: 96 mmol/L — ABNORMAL LOW (ref 101–111)
Creatinine, Ser: 1.4 mg/dL — ABNORMAL HIGH (ref 0.61–1.24)
GFR calc Af Amer: >60 mL/min (ref >60)
GFR calc non Af Amer: 53 mL/min — ABNORMAL LOW (ref >60)
GLUCOSE: 131 mg/dL — AB (ref 65–99)
PHOSPHORUS: 4.4 mg/dL (ref 2.5–4.6)
POTASSIUM: 4.3 mmol/L (ref 3.5–5.1)
SODIUM: 130 mmol/L — AB (ref 135–145)

## 2017-07-18 LAB — CBC
HCT: 22.6 % — ABNORMAL LOW (ref 39.0–52.0)
Hemoglobin: 7 g/dL — ABNORMAL LOW (ref 13.0–17.0)
MCH: 26.2 pg (ref 26.0–34.0)
MCHC: 31 g/dL (ref 30.0–36.0)
MCV: 84.6 fL (ref 78.0–100.0)
Platelets: 162 10*3/uL (ref 150–400)
RBC: 2.67 MIL/uL — ABNORMAL LOW (ref 4.22–5.81)
RDW: 17.1 % — AB (ref 11.5–15.5)
WBC: 10 10*3/uL (ref 4.0–10.5)

## 2017-07-18 NOTE — Progress Notes (Signed)
Pulmonary Critical Care Medicine [facility] [place]   PULMONARY SERVICE  PROGRESS NOTE  Date of Service: 07/18/2017  Melvin Kelley  EXB:[id number]  DOB: 1955/02/28   DOA: 06/08/2017  Referring Physician: Carron Curie, MD  HPI: Melvin Kelley is a 62 y.o. male seen for follow up of Acute on Chronic Respiratory Failure.  He is on dialysis this morning was not able to tolerate the T collar so was placed back on the ventilator.  Right now he is resting on assist control mode has been on 28% FiO2 with a PEEP of 5 right now  Medications: Reviewed on Rounds  Physical Exam:  Vitals: Temperature 98.4 pulse 95 respiratory rate 30 blood pressure 130/74 saturations 90%  Ventilator Settings mode of ventilation assist control FiO2 20% tidal volume 454cc PEEP of 5  . General: Comfortable at this time . Eyes: Grossly normal lids, irises & conjunctiva . ENT: grossly tongue is normal . Neck: no obvious mass . Cardiovascular: S1 S2 normal no gallop . Respiratory: No rhonchi expansion equal . Abdomen: soft . Skin: no rash seen on limited exam . Musculoskeletal: not rigid . Psychiatric:unable to assess . Neurologic: no seizure no involuntary movements         Labs on Admission:  Basic Metabolic Panel: Recent Labs  Lab 07/11/17 1639 07/12/17 0531 07/13/17 0457 07/14/17 0818 07/16/17 0617  NA 128* 127*  --  121* 126*  K 5.3* 5.4*  --  5.3* 5.1  CL 93* 93*  --  86* 95*  CO2 25 25  --  26 25  GLUCOSE 113* 114*  --  109* 92  BUN 65* 76*  --  107* 63*  CREATININE 1.56* 1.66*  --  1.93* 1.47*  CALCIUM 9.1 9.0  --  8.8* 9.1  MG  --   --  2.1  --  2.0  PHOS  --   --   --  7.1* 4.3    Liver Function Tests: Recent Labs  Lab 07/14/17 0818 07/16/17 0617  ALBUMIN 2.3* 2.5*   No results for input(s): LIPASE, AMYLASE in the last 168 hours. No results for input(s): AMMONIA in the last 168 hours.  CBC: Recent Labs  Lab 07/14/17 0818 07/16/17 0617 07/18/17 0909  WBC  14.1* 8.9 10.0  HGB 7.6* 7.5* 7.0*  HCT 23.6* 23.6* 22.6*  MCV 83.7 85.2 84.6  PLT 330 209 162    Cardiac Enzymes: No results for input(s): CKTOTAL, CKMB, CKMBINDEX, TROPONINI in the last 168 hours.  BNP (last 3 results) No results for input(s): BNP in the last 8760 hours.  ProBNP (last 3 results) No results for input(s): PROBNP in the last 8760 hours.  Radiological Exams on Admission: No results found.  Assessment/Plan Active Problems:   Cardiac arrest (HCC)   Acute on chronic respiratory failure (HCC)   CHF (congestive heart failure) (HCC)   Obstructive sleep apnea   Acute kidney injury (HCC)   1. Acute on chronic respiratory failure with hypoxia holding the wean for rate now.  Once dialysis is over I have instructed respiratory therapy to resume the wean.  We will continue with supportive care pulmonary toilet and monitor. 2. Congestive heart failure patient is on dialysis for fluid removal 3. Acute renal failure continue with dialysis as per nephrology recommendations. 4. Obstructive sleep apnea nonissue at this time 5. Encephalopathy grossly unchanged 6. Status post cardiac arrest at baseline   I have personally seen and evaluated the patient, evaluated laboratory and imaging results, formulated the  assessment and plan and placed orders. The Patient requires high complexity decision making for assessment and support.  Case was discussed on Rounds with the Respiratory Therapy Staff  Allyne Gee, MD [facility] Pulmonary Critical Care Medicine Sleep Medicine

## 2017-07-19 ENCOUNTER — Other Ambulatory Visit (HOSPITAL_COMMUNITY): Payer: Self-pay

## 2017-07-19 LAB — MAGNESIUM: MAGNESIUM: 2 mg/dL (ref 1.7–2.4)

## 2017-07-19 LAB — POTASSIUM: Potassium: 4.9 mmol/L (ref 3.5–5.1)

## 2017-07-19 NOTE — Progress Notes (Signed)
Pulmonary Critical Care Medicine [facility] [place]   PULMONARY SERVICE  PROGRESS NOTE  Date of Service: 07/19/2017  Melvin Kelley  TKZ:[id number]  DOB: Nov 06, 1955   DOA: 05/28/2017  Referring Physician: Carron Curie, MD  HPI: Melvin Kelley is a 62 y.o. male seen for follow up of Acute on Chronic Respiratory Failure.  Remains comfortable without distress is on full support on assist control mode.  Medications: Reviewed on Rounds  Physical Exam:  Vitals: Temperature 97.6 pulse 130 respiratory rate 32 blood pressure 125/64 saturations 100%  Ventilator Settings mode of ventilation is assist control FiO2 20% tidal volume 482cc PEEP of 5  . General: Comfortable at this time . Eyes: Grossly normal lids, irises & conjunctiva . ENT: grossly tongue is normal . Neck: no obvious mass . Cardiovascular: S1 S2 normal no gallop . Respiratory: Coarse rhonchi expansion is equal . Abdomen: soft . Skin: no rash seen on limited exam . Musculoskeletal: not rigid . Psychiatric:unable to assess . Neurologic: no seizure no involuntary movements         Labs on Admission:  Basic Metabolic Panel: Recent Labs  Lab 07/13/17 0457 07/14/17 0818 07/16/17 0617 07/18/17 0909 07/19/17 0926  NA  --  121* 126* 130*  --   K  --  5.3* 5.1 4.3 4.9  CL  --  86* 95* 96*  --   CO2  --  26 25 26   --   GLUCOSE  --  109* 92 131*  --   BUN  --  107* 63* 53*  --   CREATININE  --  1.93* 1.47* 1.40*  --   CALCIUM  --  8.8* 9.1 9.6  --   MG 2.1  --  2.0  --  2.0  PHOS  --  7.1* 4.3 4.4  --     Liver Function Tests: Recent Labs  Lab 07/14/17 0818 07/16/17 0617 07/18/17 0909  ALBUMIN 2.3* 2.5* 2.6*   No results for input(s): LIPASE, AMYLASE in the last 168 hours. No results for input(s): AMMONIA in the last 168 hours.  CBC: Recent Labs  Lab 07/14/17 0818 07/16/17 0617 07/18/17 0909  WBC 14.1* 8.9 10.0  HGB 7.6* 7.5* 7.0*  HCT 23.6* 23.6* 22.6*  MCV 83.7 85.2 84.6  PLT 330 209  162    Cardiac Enzymes: No results for input(s): CKTOTAL, CKMB, CKMBINDEX, TROPONINI in the last 168 hours.  BNP (last 3 results) No results for input(s): BNP in the last 8760 hours.  ProBNP (last 3 results) No results for input(s): PROBNP in the last 8760 hours.  Radiological Exams on Admission: Dg Abd Portable 1v  Result Date: 07/19/2017 CLINICAL DATA:  Nasogastric tube placement. EXAM: PORTABLE ABDOMEN - 1 VIEW COMPARISON:  Abdominal radiograph performed 07/09/2017 FINDINGS: The patient's enteric tube is noted ending overlying the body of the stomach. Marked colonic distention is again noted. Evaluation for free air is limited on this single supine view. A femoral line is noted. No acute osseous abnormalities are seen. IMPRESSION: Enteric tube noted ending overlying the body of the stomach. Electronically Signed   By: Roanna Raider M.D.   On: 07/19/2017 05:54    Assessment/Plan Active Problems:   Cardiac arrest (HCC)   Acute on chronic respiratory failure (HCC)   CHF (congestive heart failure) (HCC)   Obstructive sleep apnea   Acute kidney injury (HCC)   1. Acute on chronic respiratory failure with hypoxia we will continue with full support at this time.  Patient is on  assist control has not been for the last couple of days.  Will reassess again in the morning continue secretion management follow along 2. Congestive heart failure we will follow clinically on dialysis remove extra fluid perhaps might help 3. Acute renal failure monitored by nephrology continue with dialysis as ordered 4. Cardiac arrest unchanged at baseline 5. Sleep apnea nonissue   I have personally seen and evaluated the patient, evaluated laboratory and imaging results, formulated the assessment and plan and placed orders. The Patient requires high complexity decision making for assessment and support.  Case was discussed on Rounds with the Respiratory Therapy Staff  Yevonne Pax, MD [facility] Pulmonary  Critical Care Medicine Sleep Medicine

## 2017-07-20 NOTE — Progress Notes (Signed)
Pulmonary Critical Care Medicine [facility] [place]   PULMONARY SERVICE  PROGRESS NOTE  Date of Service: 07/20/2017  Melvin Kelley  TTS:[id number]  DOB: [birth year]   DOA: 06/06/2017  Referring Physician: Carron Curie, MD  HPI: Melvin Kelley is a 62 y.o. male seen for follow up of Acute on Chronic Respiratory Failure.  Patient underwent dialysis and is due for dialysis tomorrow comfortable without distress at this time.  Medications: Reviewed on Rounds  Physical Exam:  Vitals: Temperature 97.4 pulse 103 respiratory rate 22 blood pressure 141/78 saturations 100%  Ventilator Settings currently is off the ventilator on T collar on 28% FiO2  . General: Comfortable at this time . Eyes: Grossly normal lids, irises & conjunctiva . ENT: grossly tongue is normal . Neck: no obvious mass . Cardiovascular: S1 S2 normal no gallop . Respiratory: Good air entry no rhonchi noted . Abdomen: soft . Skin: no rash seen on limited exam . Musculoskeletal: not rigid . Psychiatric:unable to assess . Neurologic: no seizure no involuntary movements         Lab Data:   Basic Metabolic Panel: Recent Labs  Lab 07/14/17 0818 07/16/17 0617 07/18/17 0909 07/19/17 0926  NA 121* 126* 130*  --   K 5.3* 5.1 4.3 4.9  CL 86* 95* 96*  --   CO2 26 25 26   --   GLUCOSE 109* 92 131*  --   BUN 107* 63* 53*  --   CREATININE 1.93* 1.47* 1.40*  --   CALCIUM 8.8* 9.1 9.6  --   MG  --  2.0  --  2.0  PHOS 7.1* 4.3 4.4  --     Liver Function Tests: Recent Labs  Lab 07/14/17 0818 07/16/17 0617 07/18/17 0909  ALBUMIN 2.3* 2.5* 2.6*   No results for input(s): LIPASE, AMYLASE in the last 168 hours. No results for input(s): AMMONIA in the last 168 hours.  CBC: Recent Labs  Lab 07/14/17 0818 07/16/17 0617 07/18/17 0909  WBC 14.1* 8.9 10.0  HGB 7.6* 7.5* 7.0*  HCT 23.6* 23.6* 22.6*  MCV 83.7 85.2 84.6  PLT 330 209 162    Cardiac Enzymes: No results for input(s): CKTOTAL, CKMB,  CKMBINDEX, TROPONINI in the last 168 hours.  BNP (last 3 results) No results for input(s): BNP in the last 8760 hours.  ProBNP (last 3 results) No results for input(s): PROBNP in the last 8760 hours.  Radiological Exams: Dg Abd Portable 1v  Result Date: 07/19/2017 CLINICAL DATA:  Nasogastric tube placement. EXAM: PORTABLE ABDOMEN - 1 VIEW COMPARISON:  Abdominal radiograph performed 07/09/2017 FINDINGS: The patient's enteric tube is noted ending overlying the body of the stomach. Marked colonic distention is again noted. Evaluation for free air is limited on this single supine view. A femoral line is noted. No acute osseous abnormalities are seen. IMPRESSION: Enteric tube noted ending overlying the body of the stomach. Electronically Signed   By: Roanna Raider M.D.   On: 07/19/2017 05:54    Assessment/Plan Active Problems:   Cardiac arrest (HCC)   Acute on chronic respiratory failure (HCC)   CHF (congestive heart failure) (HCC)   Obstructive sleep apnea   Acute kidney injury (HCC)   1. Acute on chronic respiratory failure with hypoxia we will continue with weaning on T collar continue pulmonary toilet supportive care overall prognosis is guarded 2. Congestive heart failure stable we will monitor 3. Acute renal failure on dialysis nephrology following 4. Sleep apnea nonissue   I have personally seen and  evaluated the patient, evaluated laboratory and imaging results, formulated the assessment and plan and placed orders. The Patient requires high complexity decision making for assessment and support.  Case was discussed on Rounds with the Respiratory Therapy Staff  Allyne Gee, MD [facility] Pulmonary Critical Care Medicine Sleep Medicine

## 2017-07-20 NOTE — Progress Notes (Signed)
[facility]  ROUNDING NOTE   Subjective:  Tracheostomy is capped.   Patient able to verbalize. Next dialysis treatment tomorrow.  Objective:  Vital signs in last 24 hours:  Temperature nine 9.4 pulse 103 respirations 22 blood pressure 141/58  Physical Exam: General: Chronically ill-appearing  Head: Severe temporal wasting, NG in place  Eyes: Anicteric  Neck: Tracheostomy in place, capped  Lungs:  Bilateral rhonchi, normal effort  Heart: S1S2 no rubs  Abdomen:  Soft, nontender, bowel sounds present  Extremities: No peripheral edema.  Neurologic: Awake, alert  Skin: No rash  Access: Right subclavain temporary dialysis catheter    Basic Metabolic Panel: Recent Labs  Lab 07/14/17 0818 07/16/17 0617 07/18/17 0909 07/19/17 0926  NA 121* 126* 130*  --   K 5.3* 5.1 4.3 4.9  CL 86* 95* 96*  --   CO2 26 25 26   --   GLUCOSE 109* 92 131*  --   BUN 107* 63* 53*  --   CREATININE 1.93* 1.47* 1.40*  --   CALCIUM 8.8* 9.1 9.6  --   MG  --  2.0  --  2.0  PHOS 7.1* 4.3 4.4  --     Liver Function Tests: Recent Labs  Lab 07/14/17 0818 07/16/17 0617 07/18/17 0909  ALBUMIN 2.3* 2.5* 2.6*   No results for input(s): LIPASE, AMYLASE in the last 168 hours. No results for input(s): AMMONIA in the last 168 hours.  CBC: Recent Labs  Lab 07/14/17 0818 07/16/17 0617 07/18/17 0909  WBC 14.1* 8.9 10.0  HGB 7.6* 7.5* 7.0*  HCT 23.6* 23.6* 22.6*  MCV 83.7 85.2 84.6  PLT 330 209 162    Cardiac Enzymes: No results for input(s): CKTOTAL, CKMB, CKMBINDEX, TROPONINI in the last 168 hours.  BNP: Invalid input(s): POCBNP  CBG: No results for input(s): GLUCAP in the last 168 hours.  Microbiology: Results for orders placed or performed during the hospital encounter of 05/24/17  MRSA PCR Screening     Status: Abnormal   Collection Time: 05/24/17  6:01 AM  Result Value Ref Range Status   MRSA by PCR POSITIVE (A) NEGATIVE Final    Comment:        The GeneXpert MRSA  Assay (FDA approved for NASAL specimens only), is one component of a comprehensive MRSA colonization surveillance program. It is not intended to diagnose MRSA infection nor to guide or monitor treatment for MRSA infections. RESULT CALLED TO, READ BACK BY AND VERIFIED WITHAlgis Downs [facility] RN AT [phone] 05/24/17 BY A.DAVIS Performed at [facility], [address], [place], [place] [address]   Fungus culture, blood     Status: None   Collection Time: 05/24/17  6:15 AM  Result Value Ref Range Status   Specimen Description BLOOD RIGHT HAND  Final   Special Requests   Final    BOTTLES DRAWN AEROBIC ONLY Blood Culture results may not be optimal due to an inadequate volume of blood received in culture bottles   Culture   Final    NO GROWTH 7 DAYS NO FUNGUS ISOLATED Performed at [facility], [address], [place], [place] [address]    Report Status 05/31/2017 FINAL  Final  Culture, blood (routine x 2)     Status: None   Collection Time: 05/24/17  6:20 AM  Result Value Ref Range Status   Specimen Description BLOOD RIGHT HAND  Final   Special Requests   Final    BOTTLES DRAWN AEROBIC ONLY Blood  Culture results may not be optimal due to an inadequate volume of blood received in culture bottles   Culture   Final    NO GROWTH 5 DAYS Performed at [facility], [address] [place], [place] [address]    Report Status 06/08/2017 FINAL  Final  Culture, blood (routine x 2)     Status: None   Collection Time: 05/24/17  6:41 AM  Result Value Ref Range Status   Specimen Description BLOOD LEFT ANTECUBITAL  Final   Special Requests   Final    BOTTLES DRAWN AEROBIC ONLY Blood Culture results may not be optimal due to an inadequate volume of blood received in culture bottles   Culture   Final    NO GROWTH 5 DAYS Performed at [facility], [address] [place], [place] [address]    Report Status 05/28/2017 FINAL  Final  Culture, respiratory (NON-Expectorated)     Status:  None   Collection Time: 05/24/17  3:25 PM  Result Value Ref Range Status   Specimen Description TRACHEAL ASPIRATE  Final   Special Requests NONE  Final   Gram Stain   Final    ABUNDANT WBC PRESENT,BOTH PMN AND MONONUCLEAR RARE SQUAMOUS EPITHELIAL CELLS PRESENT RARE GRAM POSITIVE COCCI IN PAIRS IN CHAINS RARE GRAM NEGATIVE COCCOBACILLI    Culture   Final    FEW Consistent with normal respiratory flora. Performed at [facility], [address], [place], [place] [address]    Report Status 05/27/2017 FINAL  Final    Coagulation Studies: No results for input(s): LABPROT, INR in the last 72 hours.  Urinalysis: No results for input(s): COLORURINE, LABSPEC, PHURINE, GLUCOSEU, HGBUR, BILIRUBINUR, KETONESUR, PROTEINUR, UROBILINOGEN, NITRITE, LEUKOCYTESUR in the last 72 hours.  Invalid input(s): APPERANCEUR    Imaging: Dg Abd Portable 1v  Result Date: 07/19/2017 CLINICAL DATA:  Nasogastric tube placement. EXAM: PORTABLE ABDOMEN - 1 VIEW COMPARISON:  Abdominal radiograph performed 07/09/2017 FINDINGS: The patient's enteric tube is noted ending overlying the body of the stomach. Marked colonic distention is again noted. Evaluation for free air is limited on this single supine view. A femoral line is noted. No acute osseous abnormalities are seen. IMPRESSION: Enteric tube noted ending overlying the body of the stomach. Electronically Signed   By: Roanna Raider M.D.   On: 07/19/2017 05:54     Medications:     lidocaine (PF)  Assessment/ Plan:  62 y.o. 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   1.  Severe acute renal failure. 2.  Hyponatremia, Na up to 130 3.  Acute respiratory failure, on vent QHS 4.  Anemia unspecified, Hgb down to 7 5.  Severe protein calorie malnutrition with severe emaciation, albumin 2.6 6.  Hyperkalemia.  Plan: Patient due for hemodialysis again on Tuesday.  We will prepare orders for this issue.  As before metabolic parameters have improved after reinitiation of dialysis for the second time.  Patient does not tolerate being off of dialysis as he develops severe azotemia.  Patient currently off of the ventilator and his tracheostomy is capped.  Hemoglobin is drifted down to 7.  Consider blood  transfusion for this but defer to hospitalist.  Patient remains on TPN for severe malnutrition.    LOS: 0 Raeleigh Guinn 6/3/20198:33 AM

## 2017-07-21 LAB — CBC
HCT: 20.4 % — ABNORMAL LOW (ref 39.0–52.0)
Hemoglobin: 6.4 g/dL — CL (ref 13.0–17.0)
MCH: 27.2 pg (ref 26.0–34.0)
MCHC: 31.4 g/dL (ref 30.0–36.0)
MCV: 86.8 fL (ref 78.0–100.0)
PLATELETS: 197 10*3/uL (ref 150–400)
RBC: 2.35 MIL/uL — AB (ref 4.22–5.81)
RDW: 16.5 % — ABNORMAL HIGH (ref 11.5–15.5)
WBC: 11.2 10*3/uL — AB (ref 4.0–10.5)

## 2017-07-21 LAB — RENAL FUNCTION PANEL
ALBUMIN: 2.2 g/dL — AB (ref 3.5–5.0)
Anion gap: 10 (ref 5–15)
BUN: 110 mg/dL — ABNORMAL HIGH (ref 6–20)
CALCIUM: 8.5 mg/dL — AB (ref 8.9–10.3)
CO2: 24 mmol/L (ref 22–32)
CREATININE: 2.96 mg/dL — AB (ref 0.61–1.24)
Chloride: 89 mmol/L — ABNORMAL LOW (ref 101–111)
GFR, EST AFRICAN AMERICAN: 25 mL/min — AB (ref >60)
GFR, EST NON AFRICAN AMERICAN: 22 mL/min — AB (ref >60)
Glucose, Bld: 142 mg/dL — ABNORMAL HIGH (ref 65–99)
PHOSPHORUS: 8.1 mg/dL — AB (ref 2.5–4.6)
Potassium: 5.8 mmol/L — ABNORMAL HIGH (ref 3.5–5.1)
SODIUM: 123 mmol/L — AB (ref 135–145)

## 2017-07-21 LAB — PREPARE RBC (CROSSMATCH)

## 2017-07-22 ENCOUNTER — Other Ambulatory Visit (HOSPITAL_COMMUNITY): Payer: Self-pay

## 2017-07-22 LAB — TYPE AND SCREEN
ABO/RH(D): B POS
ANTIBODY SCREEN: NEGATIVE
Unit division: 0

## 2017-07-22 LAB — BPAM RBC
Blood Product Expiration Date: 201906192359
ISSUE DATE / TIME: 201906041011
Unit Type and Rh: 7300

## 2017-07-22 LAB — CBC
HCT: 25.9 % — ABNORMAL LOW (ref 39.0–52.0)
Hemoglobin: 8.2 g/dL — ABNORMAL LOW (ref 13.0–17.0)
MCH: 26 pg (ref 26.0–34.0)
MCHC: 31.7 g/dL (ref 30.0–36.0)
MCV: 82.2 fL (ref 78.0–100.0)
PLATELETS: 139 10*3/uL — AB (ref 150–400)
RBC: 3.15 MIL/uL — ABNORMAL LOW (ref 4.22–5.81)
RDW: 19.6 % — AB (ref 11.5–15.5)
WBC: 12.7 10*3/uL — AB (ref 4.0–10.5)

## 2017-07-22 LAB — BASIC METABOLIC PANEL
ANION GAP: 8 (ref 5–15)
BUN: 68 mg/dL — ABNORMAL HIGH (ref 6–20)
CALCIUM: 8.3 mg/dL — AB (ref 8.9–10.3)
CO2: 25 mmol/L (ref 22–32)
CREATININE: 2.22 mg/dL — AB (ref 0.61–1.24)
Chloride: 94 mmol/L — ABNORMAL LOW (ref 101–111)
GFR calc Af Amer: 35 mL/min — ABNORMAL LOW (ref >60)
GFR calc non Af Amer: 30 mL/min — ABNORMAL LOW (ref >60)
GLUCOSE: 153 mg/dL — AB (ref 65–99)
Potassium: 4.9 mmol/L (ref 3.5–5.1)
Sodium: 127 mmol/L — ABNORMAL LOW (ref 135–145)

## 2017-07-22 LAB — MAGNESIUM: Magnesium: 2.3 mg/dL (ref 1.7–2.4)

## 2017-07-22 NOTE — Progress Notes (Addendum)
[facility]  ROUNDING NOTE   Subjective:  Pt resting in bed.  Remains off ventilator currently.   Objective:  Vital signs in last 24 hours:  Temperature 98.9 pulse 104 respirations 32 blood pressure 103/42  Physical Exam: General: Chronically ill-appearing  Head: Severe temporal wasting, NG in place  Eyes: Anicteric  Neck: Tracheostomy in place, capped  Lungs:  Bilateral rhonchi, normal effort  Heart: S1S2 no rubs  Abdomen:  Soft, nontender, bowel sounds present  Extremities: No peripheral edema.  Neurologic: Awake, alert  Skin: No rash  Access: Right subclavain temporary dialysis catheter    Basic Metabolic Panel: Recent Labs  Lab 07/16/17 0617 07/18/17 0909 07/19/17 0926 07/21/17 0645 07/22/17 0759  NA 126* 130*  --  123*  --   K 5.1 4.3 4.9 5.8*  --   CL 95* 96*  --  89*  --   CO2 25 26  --  24  --   GLUCOSE 92 131*  --  142*  --   BUN 63* 53*  --  110*  --   CREATININE 1.47* 1.40*  --  2.96*  --   CALCIUM 9.1 9.6  --  8.5*  --   MG 2.0  --  2.0  --  2.3  PHOS 4.3 4.4  --  8.1*  --     Liver Function Tests: Recent Labs  Lab 07/16/17 0617 07/18/17 0909 07/21/17 0645  ALBUMIN 2.5* 2.6* 2.2*   No results for input(s): LIPASE, AMYLASE in the last 168 hours. No results for input(s): AMMONIA in the last 168 hours.  CBC: Recent Labs  Lab 07/16/17 0617 07/18/17 0909 07/21/17 0645 07/22/17 0759  WBC 8.9 10.0 11.2* 12.7*  HGB 7.5* 7.0* 6.4* 8.2*  HCT 23.6* 22.6* 20.4* 25.9*  MCV 85.2 84.6 86.8 82.2  PLT 209 162 197 139*    Cardiac Enzymes: No results for input(s): CKTOTAL, CKMB, CKMBINDEX, TROPONINI in the last 168 hours.  BNP: Invalid input(s): POCBNP  CBG: No results for input(s): GLUCAP in the last 168 hours.  Microbiology: Results for orders placed or performed during the hospital encounter of 05/24/17  MRSA PCR Screening     Status: Abnormal   Collection Time: 05/24/17  6:01 AM  Result Value Ref Range Status   MRSA by PCR  POSITIVE (A) NEGATIVE Final    Comment:        The GeneXpert MRSA Assay (FDA approved for NASAL specimens only), is one component of a comprehensive MRSA colonization surveillance program. It is not intended to diagnose MRSA infection nor to guide or monitor treatment for MRSA infections. RESULT CALLED TO, READ BACK BY AND VERIFIED WITHAlgis Downs [facility] RN AT [phone] 05/24/17 BY A.DAVIS Performed at [facility], [address] , [place] [address]   Fungus culture, blood     Status: None   Collection Time: 05/24/17  6:15 AM  Result Value Ref Range Status   Specimen Description BLOOD RIGHT HAND  Final   Special Requests   Final    BOTTLES DRAWN AEROBIC ONLY Blood Culture results may not be optimal due to an inadequate volume of blood received in culture bottles   Culture   Final    NO GROWTH 7 DAYS NO FUNGUS ISOLATED Performed at [facility], [address], [place], [place] [address]    Report Status 05/31/2017 FINAL  Final  Culture, blood (routine x 2)     Status: None   Collection Time: 05/24/17  6:20 AM  Result Value Ref Range Status   Specimen Description BLOOD RIGHT HAND  Final   Special Requests   Final    BOTTLES DRAWN AEROBIC ONLY Blood Culture results may not be optimal due to an inadequate volume of blood received in culture bottles   Culture   Final    NO GROWTH 5 DAYS Performed at [facility], [address], [place], [place] [address]    Report Status 05/23/2017 FINAL  Final  Culture, blood (routine x 2)     Status: None   Collection Time: 05/24/17  6:41 AM  Result Value Ref Range Status   Specimen Description BLOOD LEFT ANTECUBITAL  Final   Special Requests   Final    BOTTLES DRAWN AEROBIC ONLY Blood Culture results may not be optimal due to an inadequate volume of blood received in culture bottles   Culture   Final    NO GROWTH 5 DAYS Performed at [facility], [address] [place], [place] [address]    Report Status 06/01/2017  FINAL  Final  Culture, respiratory (NON-Expectorated)     Status: None   Collection Time: 05/24/17  3:25 PM  Result Value Ref Range Status   Specimen Description TRACHEAL ASPIRATE  Final   Special Requests NONE  Final   Gram Stain   Final    ABUNDANT WBC PRESENT,BOTH PMN AND MONONUCLEAR RARE SQUAMOUS EPITHELIAL CELLS PRESENT RARE GRAM POSITIVE COCCI IN PAIRS IN CHAINS RARE GRAM NEGATIVE COCCOBACILLI    Culture   Final    FEW Consistent with normal respiratory flora. Performed at [facility], [address], [place], [place] [address]    Report Status 05/27/2017 FINAL  Final    Coagulation Studies: No results for input(s): LABPROT, INR in the last 72 hours.  Urinalysis: No results for input(s): COLORURINE, LABSPEC, PHURINE, GLUCOSEU, HGBUR, BILIRUBINUR, KETONESUR, PROTEINUR, UROBILINOGEN, NITRITE, LEUKOCYTESUR in the last 72 hours.  Invalid input(s): APPERANCEUR    Imaging: No results found.   Medications:     lidocaine (PF)  Assessment/ Plan:  62 y.o. 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   1.  Severe acute renal failure. 2.  Hyponatremia, Na 123 3.  Acute respiratory failure, on vent QHS 4.  Anemia unspecified, Hgb up to 8 post transufsion 5.  Severe protein calorie malnutrition with severe emaciation, albumin 2.2 6.  Hyperkalemia, K 5.8  Plan: Patient continues dialysis on Tuesday, Thursday, Saturday schedule.  Serum electrolytes did worsen over the the weekend.  Serum sodium was down to 123  yesterday.  In addition serum potassium is high at 5.8.  We do plan to repeat these parameters tomorrow.  In addition he was quite anemic yesterday with a hemoglobin of 6.4.  He underwent transfusion and CBC shows hemoglobin of 8.2 now.  Overall the patient remains quite ill and has a guarded long-term prognosis.    LOS: 0 Gricelda Foland 6/5/201911:12 AM

## 2017-07-22 NOTE — Progress Notes (Signed)
Pulmonary Critical Care Medicine [facility] [place]   PULMONARY SERVICE  PROGRESS NOTE  Date of Service: 07/22/2017  Melvin Kelley  OFB:[id number]  DOB: [birth year]   DOA: Jun 22, 2017  Referring Physician: Carron Curie, MD  HPI: Melvin Kelley is a 62 y.o. male seen for follow up of Acute on Chronic Respiratory Failure.  Patient is on T collar has been on 28% oxygen tolerating the PMV fairly well.  Using the ventilator at nighttime  Medications: Reviewed on Rounds  Physical Exam:  Vitals: Temperature 98.7 pulse 98 respiratory rate 17 blood pressure 104 66 saturations 97%  Ventilator Settings currently is on T collar FiO2 28%  . General: Comfortable at this time . Eyes: Grossly normal lids, irises & conjunctiva . ENT: grossly tongue is normal . Neck: no obvious mass . Cardiovascular: S1 S2 normal no gallop . Respiratory: Good air entry no rhonchi is noted at this time. . Abdomen: soft . Skin: no rash seen on limited exam . Musculoskeletal: not rigid . Psychiatric:unable to assess . Neurologic: no seizure no involuntary movements         Lab Data:   Basic Metabolic Panel: Recent Labs  Lab 07/16/17 0617 07/18/17 0909 07/19/17 0926 07/21/17 0645 07/22/17 0759 07/22/17 1101  NA 126* 130*  --  123*  --  127*  K 5.1 4.3 4.9 5.8*  --  4.9  CL 95* 96*  --  89*  --  94*  CO2 25 26  --  24  --  25  GLUCOSE 92 131*  --  142*  --  153*  BUN 63* 53*  --  110*  --  68*  CREATININE 1.47* 1.40*  --  2.96*  --  2.22*  CALCIUM 9.1 9.6  --  8.5*  --  8.3*  MG 2.0  --  2.0  --  2.3  --   PHOS 4.3 4.4  --  8.1*  --   --     Liver Function Tests: Recent Labs  Lab 07/16/17 0617 07/18/17 0909 07/21/17 0645  ALBUMIN 2.5* 2.6* 2.2*   No results for input(s): LIPASE, AMYLASE in the last 168 hours. No results for input(s): AMMONIA in the last 168 hours.  CBC: Recent Labs  Lab 07/16/17 0617 07/18/17 0909 07/21/17 0645 07/22/17 0759  WBC 8.9 10.0 11.2* 12.7*   HGB 7.5* 7.0* 6.4* 8.2*  HCT 23.6* 22.6* 20.4* 25.9*  MCV 85.2 84.6 86.8 82.2  PLT 209 162 197 139*    Cardiac Enzymes: No results for input(s): CKTOTAL, CKMB, CKMBINDEX, TROPONINI in the last 168 hours.  BNP (last 3 results) No results for input(s): BNP in the last 8760 hours.  ProBNP (last 3 results) No results for input(s): PROBNP in the last 8760 hours.  Radiological Exams: No results found.  Assessment/Plan Active Problems:   Cardiac arrest (HCC)   Acute on chronic respiratory failure (HCC)   CHF (congestive heart failure) (HCC)   Obstructive sleep apnea   Acute kidney injury (HCC)   1. Acute on chronic respiratory failure with hypoxia patient is on T collar right now we will continue with PMV patient is resting on the ventilator at nighttime as ordered. 2. Congestive heart failure compensated continue fluid monitoring and management 3. Acute renal failure on hemodialysis followed by nephrology. 4. Sleep apnea nonissue 5. Status post cardiac arrest at baseline   I have personally seen and evaluated the patient, evaluated laboratory and imaging results, formulated the assessment and plan and placed orders. The  Patient requires high complexity decision making for assessment and support.  Case was discussed on Rounds with the Respiratory Therapy Staff  Allyne Gee, MD [facility] Pulmonary Critical Care Medicine Sleep Medicine

## 2017-07-23 LAB — RENAL FUNCTION PANEL
ANION GAP: 12 (ref 5–15)
Albumin: 2.5 g/dL — ABNORMAL LOW (ref 3.5–5.0)
BUN: 78 mg/dL — ABNORMAL HIGH (ref 6–20)
CO2: 23 mmol/L (ref 22–32)
Calcium: 8.8 mg/dL — ABNORMAL LOW (ref 8.9–10.3)
Chloride: 93 mmol/L — ABNORMAL LOW (ref 101–111)
Creatinine, Ser: 2.18 mg/dL — ABNORMAL HIGH (ref 0.61–1.24)
GFR calc non Af Amer: 31 mL/min — ABNORMAL LOW (ref >60)
GFR, EST AFRICAN AMERICAN: 36 mL/min — AB (ref >60)
Glucose, Bld: 83 mg/dL (ref 65–99)
Phosphorus: 5.5 mg/dL — ABNORMAL HIGH (ref 2.5–4.6)
Potassium: 5.8 mmol/L — ABNORMAL HIGH (ref 3.5–5.1)
Sodium: 128 mmol/L — ABNORMAL LOW (ref 135–145)

## 2017-07-23 LAB — CBC
HEMATOCRIT: 27 % — AB (ref 39.0–52.0)
HEMOGLOBIN: 8.4 g/dL — AB (ref 13.0–17.0)
MCH: 25.9 pg — AB (ref 26.0–34.0)
MCHC: 31.1 g/dL (ref 30.0–36.0)
MCV: 83.3 fL (ref 78.0–100.0)
Platelets: 196 10*3/uL (ref 150–400)
RBC: 3.24 MIL/uL — AB (ref 4.22–5.81)
RDW: 18.9 % — ABNORMAL HIGH (ref 11.5–15.5)
WBC: 10.6 10*3/uL — ABNORMAL HIGH (ref 4.0–10.5)

## 2017-07-23 NOTE — Progress Notes (Signed)
Pulmonary Critical Care Medicine [facility] [place]   PULMONARY SERVICE  PROGRESS NOTE  Date of Service: 07/23/2017  Mycah Delcore  QPY:[id number]  DOB: [birth year]   DOA: 05/21/2017  Referring Physician: Carron Curie, MD  HPI: Melvin Kelley is a 62 y.o. male seen for follow up of Acute on Chronic Respiratory Failure.  Remains on T collar at this time has been requiring 28% oxygen good saturations are noted.  Patient's been less responsive however reportedly  Medications: Reviewed on Rounds  Physical Exam:  Vitals: Temperature 97.1 pulse 89 respirate 19 blood pressure 101/64 saturations 100%  Ventilator Settings off the ventilator on T collar at this time  . General: Comfortable at this time . Eyes: Grossly normal lids, irises & conjunctiva . ENT: grossly tongue is normal . Neck: no obvious mass . Cardiovascular: S1 S2 normal no gallop . Respiratory: Coarse breath sounds are noted . Abdomen: soft . Skin: no rash seen on limited exam . Musculoskeletal: not rigid . Psychiatric:unable to assess . Neurologic: no seizure no involuntary movements         Lab Data:   Basic Metabolic Panel: Recent Labs  Lab 07/18/17 0909 07/19/17 0926 07/21/17 0645 07/22/17 0759 07/22/17 1101 07/23/17 0509  NA 130*  --  123*  --  127* 128*  K 4.3 4.9 5.8*  --  4.9 5.8*  CL 96*  --  89*  --  94* 93*  CO2 26  --  24  --  25 23  GLUCOSE 131*  --  142*  --  153* 83  BUN 53*  --  110*  --  68* 78*  CREATININE 1.40*  --  2.96*  --  2.22* 2.18*  CALCIUM 9.6  --  8.5*  --  8.3* 8.8*  MG  --  2.0  --  2.3  --   --   PHOS 4.4  --  8.1*  --   --  5.5*    Liver Function Tests: Recent Labs  Lab 07/18/17 0909 07/21/17 0645 07/23/17 0509  ALBUMIN 2.6* 2.2* 2.5*   No results for input(s): LIPASE, AMYLASE in the last 168 hours. No results for input(s): AMMONIA in the last 168 hours.  CBC: Recent Labs  Lab 07/18/17 0909 07/21/17 0645 07/22/17 0759 07/23/17 0509  WBC  10.0 11.2* 12.7* 10.6*  HGB 7.0* 6.4* 8.2* 8.4*  HCT 22.6* 20.4* 25.9* 27.0*  MCV 84.6 86.8 82.2 83.3  PLT 162 197 139* 196    Cardiac Enzymes: No results for input(s): CKTOTAL, CKMB, CKMBINDEX, TROPONINI in the last 168 hours.  BNP (last 3 results) No results for input(s): BNP in the last 8760 hours.  ProBNP (last 3 results) No results for input(s): PROBNP in the last 8760 hours.  Radiological Exams: Dg Chest Port 1 View  Result Date: 07/22/2017 CLINICAL DATA:  Respiratory failure.  Leukocytosis. EXAM: PORTABLE CHEST 1 VIEW COMPARISON:  Chest x-ray dated Jun 24, 2017. FINDINGS: Unchanged tracheostomy tube. Unchanged right subclavian dialysis catheter with tip in the proximal right atrium. Unchanged left chest wall pacemaker. Enteric tube coiled within the stomach. Partially visualized femoral approach central venous catheter with the tip in the proximal IVC. Stable cardiomediastinal silhouette. Persistent low lung volumes with bibasilar atelectasis. No focal consolidation, pleural effusion, or pneumothorax. Marked gaseous distention of the colon, unchanged. IMPRESSION: 1. Persistent low lung volumes with bibasilar atelectasis. No definite active disease. 2. Persistent marked gaseous distention of the colon. Electronically Signed   By: Vickki Hearing.D.  On: 07/22/2017 19:38    Assessment/Plan Active Problems:   Cardiac arrest (HCC)   Acute on chronic respiratory failure (HCC)   CHF (congestive heart failure) (HCC)   Obstructive sleep apnea   Acute kidney injury (HCC)   1. Acute on chronic respiratory failure with hypoxia we will continue to follow continue secretion management pulmonary toilet patient's at baseline on the T collar right now.  The last x-ray had shown some persistence of low volumes of lungs.  No acute disease noted. 2. Congestive heart failure we will continue with present management 3. Acute renal failure followed by nephrology for dialysis 4. Sleep apnea  nonissue at this time 5. Status post cardiac arrest stable   I have personally seen and evaluated the patient, evaluated laboratory and imaging results, formulated the assessment and plan and placed orders. The Patient requires high complexity decision making for assessment and support.  Case was discussed on Rounds with the Respiratory Therapy Staff  Yevonne Pax, MD [facility] Pulmonary Critical Care Medicine Sleep Medicine

## 2017-07-24 NOTE — Progress Notes (Signed)
Pulmonary Critical Care Medicine [facility] [place]   PULMONARY SERVICE  PROGRESS NOTE  Date of Service: 07/24/2017  Amber Guthridge  ZOX:[id number]  DOB: 1955/05/23   DOA: 05/18/2017  Referring Physician: Carron Curie, MD  HPI: Melvin Kelley is a 62 y.o. male seen for follow up of Acute on Chronic Respiratory Failure.  Remains on T collar doing about the same.  Patient is comfortable  Medications: Reviewed on Rounds  Physical Exam:  Vitals: Temperature 98.9 pulse 113 respiratory rate 20 blood pressure 132/59 saturations 100%  Ventilator Settings off of the ventilator on T collar  . General: Comfortable at this time . Eyes: Grossly normal lids, irises & conjunctiva . ENT: grossly tongue is normal . Neck: no obvious mass . Cardiovascular: S1 S2 normal no gallop . Respiratory: Coarse breath sounds no rhonchi . Abdomen: soft . Skin: no rash seen on limited exam . Musculoskeletal: not rigid . Psychiatric:unable to assess . Neurologic: no seizure no involuntary movements         Lab Data:   Basic Metabolic Panel: Recent Labs  Lab 07/18/17 0909 07/19/17 0926 07/21/17 0645 07/22/17 0759 07/22/17 1101 07/23/17 0509  NA 130*  --  123*  --  127* 128*  K 4.3 4.9 5.8*  --  4.9 5.8*  CL 96*  --  89*  --  94* 93*  CO2 26  --  24  --  25 23  GLUCOSE 131*  --  142*  --  153* 83  BUN 53*  --  110*  --  68* 78*  CREATININE 1.40*  --  2.96*  --  2.22* 2.18*  CALCIUM 9.6  --  8.5*  --  8.3* 8.8*  MG  --  2.0  --  2.3  --   --   PHOS 4.4  --  8.1*  --   --  5.5*    Liver Function Tests: Recent Labs  Lab 07/18/17 0909 07/21/17 0645 07/23/17 0509  ALBUMIN 2.6* 2.2* 2.5*   No results for input(s): LIPASE, AMYLASE in the last 168 hours. No results for input(s): AMMONIA in the last 168 hours.  CBC: Recent Labs  Lab 07/18/17 0909 07/21/17 0645 07/22/17 0759 07/23/17 0509  WBC 10.0 11.2* 12.7* 10.6*  HGB 7.0* 6.4* 8.2* 8.4*  HCT 22.6* 20.4* 25.9* 27.0*   MCV 84.6 86.8 82.2 83.3  PLT 162 197 139* 196    Cardiac Enzymes: No results for input(s): CKTOTAL, CKMB, CKMBINDEX, TROPONINI in the last 168 hours.  BNP (last 3 results) No results for input(s): BNP in the last 8760 hours.  ProBNP (last 3 results) No results for input(s): PROBNP in the last 8760 hours.  Radiological Exams: Dg Chest Port 1 View  Result Date: 07/22/2017 CLINICAL DATA:  Respiratory failure.  Leukocytosis. EXAM: PORTABLE CHEST 1 VIEW COMPARISON:  Chest x-ray dated Jun 24, 2017. FINDINGS: Unchanged tracheostomy tube. Unchanged right subclavian dialysis catheter with tip in the proximal right atrium. Unchanged left chest wall pacemaker. Enteric tube coiled within the stomach. Partially visualized femoral approach central venous catheter with the tip in the proximal IVC. Stable cardiomediastinal silhouette. Persistent low lung volumes with bibasilar atelectasis. No focal consolidation, pleural effusion, or pneumothorax. Marked gaseous distention of the colon, unchanged. IMPRESSION: 1. Persistent low lung volumes with bibasilar atelectasis. No definite active disease. 2. Persistent marked gaseous distention of the colon. Electronically Signed   By: Obie Dredge M.D.   On: 07/22/2017 19:38    Assessment/Plan Active Problems:  Cardiac arrest (HCC)   Acute on chronic respiratory failure (HCC)   CHF (congestive heart failure) (HCC)   Obstructive sleep apnea   Acute kidney injury (HCC)   1. Acute on chronic respiratory failure with hypoxia we will continue with T collar as tolerated continue secretion management pulmonary toilet overall prognosis guarded. 2. Congestive heart failure monitor fluid status patient undergoing dialysis 3. Acute renal failure followed by nephrology for dialysis 4. Sleep apnea nonissue 5. Cardiac arrest stable at this time   I have personally seen and evaluated the patient, evaluated laboratory and imaging results, formulated the assessment and  plan and placed orders. The Patient requires high complexity decision making for assessment and support.  Case was discussed on Rounds with the Respiratory Therapy Staff  Yevonne Pax, MD [facility] Pulmonary Critical Care Medicine Sleep Medicine

## 2017-07-24 NOTE — Progress Notes (Signed)
[facility]  ROUNDING NOTE   Subjective:  No significant change in status. Patient resting comfortably in bed at the moment. He will be due for dialysis later today.  Objective:  Vital signs in last 24 hours:  Temperature 98.9 pulse 113 respirations 12 blood pressure 132/59  Physical Exam: General: Chronically ill-appearing  Head: Severe temporal wasting, NG in place  Eyes: Anicteric  Neck: Tracheostomy in place  Lungs:  Bilateral rhonchi, normal effort  Heart: S1S2 no rubs  Abdomen:  Soft, nontender, bowel sounds present  Extremities: No peripheral edema.  Neurologic: Awake, alert, contractures noted  Skin: No rash  Access: Right subclavain temporary dialysis catheter    Basic Metabolic Panel: Recent Labs  Lab 07/18/17 0909 07/19/17 0926 07/21/17 0645 07/22/17 0759 07/22/17 1101 07/23/17 0509  NA 130*  --  123*  --  127* 128*  K 4.3 4.9 5.8*  --  4.9 5.8*  CL 96*  --  89*  --  94* 93*  CO2 26  --  24  --  25 23  GLUCOSE 131*  --  142*  --  153* 83  BUN 53*  --  110*  --  68* 78*  CREATININE 1.40*  --  2.96*  --  2.22* 2.18*  CALCIUM 9.6  --  8.5*  --  8.3* 8.8*  MG  --  2.0  --  2.3  --   --   PHOS 4.4  --  8.1*  --   --  5.5*    Liver Function Tests: Recent Labs  Lab 07/18/17 0909 07/21/17 0645 07/23/17 0509  ALBUMIN 2.6* 2.2* 2.5*   No results for input(s): LIPASE, AMYLASE in the last 168 hours. No results for input(s): AMMONIA in the last 168 hours.  CBC: Recent Labs  Lab 07/18/17 0909 07/21/17 0645 07/22/17 0759 07/23/17 0509  WBC 10.0 11.2* 12.7* 10.6*  HGB 7.0* 6.4* 8.2* 8.4*  HCT 22.6* 20.4* 25.9* 27.0*  MCV 84.6 86.8 82.2 83.3  PLT 162 197 139* 196    Cardiac Enzymes: No results for input(s): CKTOTAL, CKMB, CKMBINDEX, TROPONINI in the last 168 hours.  BNP: Invalid input(s): POCBNP  CBG: No results for input(s): GLUCAP in the last 168 hours.  Microbiology: Results for orders placed or performed during the hospital  encounter of 05/24/17  MRSA PCR Screening     Status: Abnormal   Collection Time: 05/24/17  6:01 AM  Result Value Ref Range Status   MRSA by PCR POSITIVE (A) NEGATIVE Final    Comment:        The GeneXpert MRSA Assay (FDA approved for NASAL specimens only), is one component of a comprehensive MRSA colonization surveillance program. It is not intended to diagnose MRSA infection nor to guide or monitor treatment for MRSA infections. RESULT CALLED TO, READ BACK BY AND VERIFIED WITHAlgis Downs [facility] RN AT [phone] 05/24/17 BY A.DAVIS Performed at [facility], [address], [place], [place] [address]   Fungus culture, blood     Status: None   Collection Time: 05/24/17  6:15 AM  Result Value Ref Range Status   Specimen Description BLOOD RIGHT HAND  Final   Special Requests   Final    BOTTLES DRAWN AEROBIC ONLY Blood Culture results may not be optimal due to an inadequate volume of blood received in culture bottles   Culture   Final    NO GROWTH 7 DAYS NO FUNGUS ISOLATED Performed at [facility], [address] [place], [place] [address]    Report Status 05/31/2017 FINAL  Final  Culture, blood (routine x 2)     Status: None   Collection Time: 05/24/17  6:20 AM  Result Value Ref Range Status   Specimen Description BLOOD RIGHT HAND  Final   Special Requests   Final    BOTTLES DRAWN AEROBIC ONLY Blood Culture results may not be optimal due to an inadequate volume of blood received in culture bottles   Culture   Final    NO GROWTH 5 DAYS Performed at [facility], [address], [place], [place] [address]    Report Status 06/09/2017 FINAL  Final  Culture, blood (routine x 2)     Status: None   Collection Time: 05/24/17  6:41 AM  Result Value Ref Range Status   Specimen Description BLOOD LEFT ANTECUBITAL  Final   Special Requests   Final    BOTTLES DRAWN AEROBIC ONLY Blood Culture results may not be optimal due to an inadequate volume of blood received in culture bottles    Culture   Final    NO GROWTH 5 DAYS Performed at [facility], [address], [place], [place] [address]    Report Status 2017-06-09 FINAL  Final  Culture, respiratory (NON-Expectorated)     Status: None   Collection Time: 05/24/17  3:25 PM  Result Value Ref Range Status   Specimen Description TRACHEAL ASPIRATE  Final   Special Requests NONE  Final   Gram Stain   Final    ABUNDANT WBC PRESENT,BOTH PMN AND MONONUCLEAR RARE SQUAMOUS EPITHELIAL CELLS PRESENT RARE GRAM POSITIVE COCCI IN PAIRS IN CHAINS RARE GRAM NEGATIVE COCCOBACILLI    Culture   Final    FEW Consistent with normal respiratory flora. Performed at [facility], [address], [place], [place] [address]    Report Status 05/27/2017 FINAL  Final    Coagulation Studies: No results for input(s): LABPROT, INR in the last 72 hours.  Urinalysis: No results for input(s): COLORURINE, LABSPEC, PHURINE, GLUCOSEU, HGBUR, BILIRUBINUR, KETONESUR, PROTEINUR, UROBILINOGEN, NITRITE, LEUKOCYTESUR in the last 72 hours.  Invalid input(s): APPERANCEUR    Imaging: Dg Chest Port 1 View  Result Date: 07/22/2017 CLINICAL DATA:  Respiratory failure.  Leukocytosis. EXAM: PORTABLE CHEST 1 VIEW COMPARISON:  Chest x-ray dated Jun 24, 2017. FINDINGS: Unchanged tracheostomy tube. Unchanged right subclavian dialysis catheter with tip in the proximal right atrium. Unchanged left chest wall pacemaker. Enteric tube coiled within the stomach. Partially visualized femoral approach central venous catheter with the tip in the proximal IVC. Stable cardiomediastinal silhouette. Persistent low lung volumes with bibasilar atelectasis. No focal consolidation, pleural effusion, or pneumothorax. Marked gaseous distention of the colon, unchanged. IMPRESSION: 1. Persistent low lung volumes with bibasilar atelectasis. No definite active disease. 2. Persistent marked gaseous distention of the colon. Electronically Signed   By: Obie Dredge M.D.   On:  07/22/2017 19:38     Medications:     lidocaine (PF)  Assessment/ Plan:  62 y.o. 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   1.  Severe acute renal failure. 2.  Hyponatremia, Na 129 3.  Acute respiratory failure 4.  Anemia unspecified, Hgb 8.4 5.  Severe protein calorie malnutrition with severe emaciation, albumin 2.5 6.  Hyperkalemia, K 5.8  Plan: Patient completed hemodialysis yesterday.  Metabolic parameters generally improve over the course of a weekend.  Potassium was bit high yesterday at 5.8.  Albumin slightly improved to 2.5.  Serum sodium was also improved to 128.  We will plan for hemodialysis again tomorrow.    LOS: 0 Esaw Knippel 6/7/20198:31 AM

## 2017-07-25 LAB — CBC
HCT: 28.1 % — ABNORMAL LOW (ref 39.0–52.0)
Hemoglobin: 8.7 g/dL — ABNORMAL LOW (ref 13.0–17.0)
MCH: 25.9 pg — ABNORMAL LOW (ref 26.0–34.0)
MCHC: 31 g/dL (ref 30.0–36.0)
MCV: 83.6 fL (ref 78.0–100.0)
PLATELETS: 157 10*3/uL (ref 150–400)
RBC: 3.36 MIL/uL — AB (ref 4.22–5.81)
RDW: 17.9 % — AB (ref 11.5–15.5)
WBC: 10.8 10*3/uL — AB (ref 4.0–10.5)

## 2017-07-25 LAB — RENAL FUNCTION PANEL
ALBUMIN: 2.6 g/dL — AB (ref 3.5–5.0)
Anion gap: 8 (ref 5–15)
BUN: 58 mg/dL — AB (ref 6–20)
CO2: 22 mmol/L (ref 22–32)
CREATININE: 1.54 mg/dL — AB (ref 0.61–1.24)
Calcium: 9.2 mg/dL (ref 8.9–10.3)
Chloride: 97 mmol/L — ABNORMAL LOW (ref 101–111)
GFR, EST AFRICAN AMERICAN: 55 mL/min — AB (ref >60)
GFR, EST NON AFRICAN AMERICAN: 47 mL/min — AB (ref >60)
Glucose, Bld: 58 mg/dL — ABNORMAL LOW (ref 65–99)
Phosphorus: 5.3 mg/dL — ABNORMAL HIGH (ref 2.5–4.6)
Potassium: 5.1 mmol/L (ref 3.5–5.1)
Sodium: 127 mmol/L — ABNORMAL LOW (ref 135–145)

## 2017-07-25 LAB — TSH: TSH: 3.063 u[IU]/mL (ref 0.350–4.500)

## 2017-07-25 LAB — MAGNESIUM: MAGNESIUM: 2.2 mg/dL (ref 1.7–2.4)

## 2017-07-25 NOTE — Progress Notes (Signed)
Pulmonary Critical Care Medicine [facility] [place]   PULMONARY SERVICE  PROGRESS NOTE  Date of Service: 07/25/2017  Melvin Kelley  ZOX:[id number]  DOB: 1956/02/08   DOA: 06-09-2017  Referring Physician: Carron Curie, MD  HPI: Melvin Kelley is a 62 y.o. male seen for follow up of Acute on Chronic Respiratory Failure.  Patient is on T collar right now has been on 28% oxygen no distress is noted at this time patient remains afebrile  Medications: Reviewed on Rounds  Physical Exam:  Vitals: Temperature 98.4 pulse 89 respiratory rate 25 blood pressure 143/70 saturations 100%  Ventilator Settings off the ventilator on T collar  . General: Comfortable at this time . Eyes: Grossly normal lids, irises & conjunctiva . ENT: grossly tongue is normal . Neck: no obvious mass . Cardiovascular: S1 S2 normal no gallop . Respiratory: No rhonchi expansion is equal . Abdomen: soft . Skin: no rash seen on limited exam . Musculoskeletal: not rigid . Psychiatric:unable to assess . Neurologic: no seizure no involuntary movements         Lab Data:   Basic Metabolic Panel: Recent Labs  Lab 07/19/17 0926 07/21/17 0645 07/22/17 0759 07/22/17 1101 07/23/17 0509 07/25/17 0826  NA  --  123*  --  127* 128* 127*  K 4.9 5.8*  --  4.9 5.8* 5.1  CL  --  89*  --  94* 93* 97*  CO2  --  24  --  25 23 22   GLUCOSE  --  142*  --  153* 83 58*  BUN  --  110*  --  68* 78* 58*  CREATININE  --  2.96*  --  2.22* 2.18* 1.54*  CALCIUM  --  8.5*  --  8.3* 8.8* 9.2  MG 2.0  --  2.3  --   --  2.2  PHOS  --  8.1*  --   --  5.5* 5.3*    Liver Function Tests: Recent Labs  Lab 07/21/17 0645 07/23/17 0509 07/25/17 0826  ALBUMIN 2.2* 2.5* 2.6*   No results for input(s): LIPASE, AMYLASE in the last 168 hours. No results for input(s): AMMONIA in the last 168 hours.  CBC: Recent Labs  Lab 07/21/17 0645 07/22/17 0759 07/23/17 0509 07/25/17 0826  WBC 11.2* 12.7* 10.6* 10.8*  HGB 6.4*  8.2* 8.4* 8.7*  HCT 20.4* 25.9* 27.0* 28.1*  MCV 86.8 82.2 83.3 83.6  PLT 197 139* 196 157    Cardiac Enzymes: No results for input(s): CKTOTAL, CKMB, CKMBINDEX, TROPONINI in the last 168 hours.  BNP (last 3 results) No results for input(s): BNP in the last 8760 hours.  ProBNP (last 3 results) No results for input(s): PROBNP in the last 8760 hours.  Radiological Exams: No results found.  Assessment/Plan Active Problems:   Cardiac arrest (HCC)   Acute on chronic respiratory failure (HCC)   CHF (congestive heart failure) (HCC)   Obstructive sleep apnea   Acute kidney injury (HCC)   1. Acute on chronic respiratory failure with hypoxia we will continue with T collar at baseline rest on the ventilator as tolerated. 2. Congestive heart failure right now is compensated 3. Sleep apnea nonissue on the vent 4. Acute renal failure followed by nephrology for dialysis   I have personally seen and evaluated the patient, evaluated laboratory and imaging results, formulated the assessment and plan and placed orders. The Patient requires high complexity decision making for assessment and support.  Case was discussed on Rounds with the Respiratory Therapy  Staff  Allyne Gee, MD [facility] Pulmonary Critical Care Medicine Sleep Medicine

## 2017-07-26 NOTE — Progress Notes (Signed)
Pulmonary Critical Care Medicine [facility] [place]   PULMONARY SERVICE  PROGRESS NOTE  Date of Service: 07/26/2017  Melvin Kelley  VWP:[id number]  DOB: 22-May-1955   DOA: Jun 25, 2017  Referring Physician: Carron Curie, MD  HPI: Melvin Kelley is a 62 y.o. male seen for follow up of Acute on Chronic Respiratory Failure.  Patient is at baseline on T collar resting on the ventilator at nighttime.  Medications: Reviewed on Rounds  Physical Exam:  Vitals: Temperature 96.5 pulse 90 respiratory rate 24 blood pressure 97/51 saturations 100%  Ventilator Settings off of the ventilator right now  . General: Comfortable at this time . Eyes: Grossly normal lids, irises & conjunctiva . ENT: grossly tongue is normal . Neck: no obvious mass . Cardiovascular: S1 S2 normal no gallop . Respiratory: Good air entry scattered distant rhonchi . Abdomen: soft . Skin: no rash seen on limited exam . Musculoskeletal: not rigid . Psychiatric:unable to assess . Neurologic: no seizure no involuntary movements         Lab Data:   Basic Metabolic Panel: Recent Labs  Lab 07/21/17 0645 07/22/17 0759 07/22/17 1101 07/23/17 0509 07/25/17 0826  NA 123*  --  127* 128* 127*  K 5.8*  --  4.9 5.8* 5.1  CL 89*  --  94* 93* 97*  CO2 24  --  25 23 22   GLUCOSE 142*  --  153* 83 58*  BUN 110*  --  68* 78* 58*  CREATININE 2.96*  --  2.22* 2.18* 1.54*  CALCIUM 8.5*  --  8.3* 8.8* 9.2  MG  --  2.3  --   --  2.2  PHOS 8.1*  --   --  5.5* 5.3*    Liver Function Tests: Recent Labs  Lab 07/21/17 0645 07/23/17 0509 07/25/17 0826  ALBUMIN 2.2* 2.5* 2.6*   No results for input(s): LIPASE, AMYLASE in the last 168 hours. No results for input(s): AMMONIA in the last 168 hours.  CBC: Recent Labs  Lab 07/21/17 0645 07/22/17 0759 07/23/17 0509 07/25/17 0826  WBC 11.2* 12.7* 10.6* 10.8*  HGB 6.4* 8.2* 8.4* 8.7*  HCT 20.4* 25.9* 27.0* 28.1*  MCV 86.8 82.2 83.3 83.6  PLT 197 139* 196 157     Cardiac Enzymes: No results for input(s): CKTOTAL, CKMB, CKMBINDEX, TROPONINI in the last 168 hours.  BNP (last 3 results) No results for input(s): BNP in the last 8760 hours.  ProBNP (last 3 results) No results for input(s): PROBNP in the last 8760 hours.  Radiological Exams: No results found.  Assessment/Plan Active Problems:   Cardiac arrest (HCC)   Acute on chronic respiratory failure (HCC)   CHF (congestive heart failure) (HCC)   Obstructive sleep apnea   Acute kidney injury (HCC)   1. Acute on chronic respiratory failure with hypoxia we will continue with supportive care rest on the ventilator at nighttime.  Patient is off the ventilator during the daytime.  We will continue secretion management pulmonary toilet. 2. Acute congestive heart failure stable we will continue to follow 3. Acute renal failure followed by nephrology for dialysis 4. Sleep apnea currently nonissue 5. Status post cardiac arrest stable   I have personally seen and evaluated the patient, evaluated laboratory and imaging results, formulated the assessment and plan and placed orders. The Patient requires high complexity decision making for assessment and support.  Case was discussed on Rounds with the Respiratory Therapy Staff  Yevonne Pax, MD [facility] Pulmonary Critical Care Medicine Sleep Medicine

## 2017-07-27 NOTE — Progress Notes (Signed)
[facility]  ROUNDING NOTE   Subjective:  Patient currently on aerosolized trach collar this a.m. He will be due for hemodialysis again tomorrow. Remains on TPN.  Objective:  Vital signs in last 24 hours:  Temperature 96.2 pulse 99 respirations 24 blood pressure 127/62  Physical Exam: General: Chronically ill-appearing  Head: Severe temporal wasting, NG in place  Eyes: Anicteric  Neck: Tracheostomy in place  Lungs:  Bilateral rhonchi, normal effort  Heart: S1S2 no rubs  Abdomen:  Soft, nontender, bowel sounds present  Extremities: No peripheral edema.  Neurologic: Awake, alert, contractures noted  Skin: No rash  Access: Right subclavain temporary dialysis catheter    Basic Metabolic Panel: Recent Labs  Lab 07/21/17 0645 07/22/17 0759 07/22/17 1101 07/23/17 0509 07/25/17 0826  NA 123*  --  127* 128* 127*  K 5.8*  --  4.9 5.8* 5.1  CL 89*  --  94* 93* 97*  CO2 24  --  25 23 22   GLUCOSE 142*  --  153* 83 58*  BUN 110*  --  68* 78* 58*  CREATININE 2.96*  --  2.22* 2.18* 1.54*  CALCIUM 8.5*  --  8.3* 8.8* 9.2  MG  --  2.3  --   --  2.2  PHOS 8.1*  --   --  5.5* 5.3*    Liver Function Tests: Recent Labs  Lab 07/21/17 0645 07/23/17 0509 07/25/17 0826  ALBUMIN 2.2* 2.5* 2.6*   No results for input(s): LIPASE, AMYLASE in the last 168 hours. No results for input(s): AMMONIA in the last 168 hours.  CBC: Recent Labs  Lab 07/21/17 0645 07/22/17 0759 07/23/17 0509 07/25/17 0826  WBC 11.2* 12.7* 10.6* 10.8*  HGB 6.4* 8.2* 8.4* 8.7*  HCT 20.4* 25.9* 27.0* 28.1*  MCV 86.8 82.2 83.3 83.6  PLT 197 139* 196 157    Cardiac Enzymes: No results for input(s): CKTOTAL, CKMB, CKMBINDEX, TROPONINI in the last 168 hours.  BNP: Invalid input(s): POCBNP  CBG: No results for input(s): GLUCAP in the last 168 hours.  Microbiology: Results for orders placed or performed during the hospital encounter of 05/24/17  MRSA PCR Screening     Status: Abnormal   Collection Time: 05/24/17  6:01 AM  Result Value Ref Range Status   MRSA by PCR POSITIVE (A) NEGATIVE Final    Comment:        The GeneXpert MRSA Assay (FDA approved for NASAL specimens only), is one component of a comprehensive MRSA colonization surveillance program. It is not intended to diagnose MRSA infection nor to guide or monitor treatment for MRSA infections. RESULT CALLED TO, READ BACK BY AND VERIFIED WITHAlgis Downs [facility] RN AT [phone] 05/24/17 BY A.DAVIS Performed at [facility] Lab, [address], [place], [place] [address]   Fungus culture, blood     Status: None   Collection Time: 05/24/17  6:15 AM  Result Value Ref Range Status   Specimen Description BLOOD RIGHT HAND  Final   Special Requests   Final    BOTTLES DRAWN AEROBIC ONLY Blood Culture results may not be optimal due to an inadequate volume of blood received in culture bottles   Culture   Final    NO GROWTH 7 DAYS NO FUNGUS ISOLATED Performed at [facility] Lab, [address], [place], [place] [address]    Report Status 05/31/2017 FINAL  Final  Culture, blood (routine x 2)     Status: None   Collection Time: 05/24/17  6:20 AM  Result Value Ref Range Status   Specimen Description BLOOD RIGHT HAND  Final   Special Requests   Final    BOTTLES DRAWN AEROBIC ONLY Blood Culture results may not be optimal due to an inadequate volume of blood received in culture bottles   Culture   Final    NO GROWTH 5 DAYS Performed at [facility], [address], [place], [place] [address]    Report Status 21-Jun-2017 FINAL  Final  Culture, blood (routine x 2)     Status: None   Collection Time: 05/24/17  6:41 AM  Result Value Ref Range Status   Specimen Description BLOOD LEFT ANTECUBITAL  Final   Special Requests   Final    BOTTLES DRAWN AEROBIC ONLY Blood Culture results may not be optimal due to an inadequate volume of blood received in culture bottles   Culture   Final    NO GROWTH 5 DAYS Performed at [facility], [address], [place], [place] [address]    Report Status 2017-06-21 FINAL  Final  Culture, respiratory (NON-Expectorated)     Status: None   Collection Time: 05/24/17  3:25 PM  Result Value Ref Range Status   Specimen Description TRACHEAL ASPIRATE  Final   Special Requests NONE  Final   Gram Stain   Final    ABUNDANT WBC PRESENT,BOTH PMN AND MONONUCLEAR RARE SQUAMOUS EPITHELIAL CELLS PRESENT RARE GRAM POSITIVE COCCI IN PAIRS IN CHAINS RARE GRAM NEGATIVE COCCOBACILLI    Culture   Final    FEW Consistent with normal respiratory flora. Performed at [facility] Lab, [address] [place], [place] [address]    Report Status 05/27/2017 FINAL  Final    Coagulation Studies: No results for input(s): LABPROT, INR in the last 72 hours.  Urinalysis: No results for input(s): COLORURINE, LABSPEC, PHURINE, GLUCOSEU, HGBUR, BILIRUBINUR, KETONESUR, PROTEINUR, UROBILINOGEN, NITRITE, LEUKOCYTESUR in the last 72 hours.  Invalid input(s): APPERANCEUR    Imaging: No results found.   Medications:     lidocaine (PF)  Assessment/ Plan:  62 y.o. 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   1.  Severe acute renal failure. 2.  Hyponatremia, Na 127 3.  Acute respiratory failure 4.  Anemia unspecified, Hgb 8.7 5.  Severe protein calorie malnutrition with severe emaciation, albumin 2.6 6.  Hyperkalemia, K 5.1  Plan: The patient continues to have prolonged acute renal failure.  Urine output was only 600 cc over the preceding  24 hours.  Patient continue dialysis on Tuesday, Thursday, Saturday.  Therefore we will plan for hemodialysis again tomorrow.  Hemoglobin has been rising slowly and is currently 8.7.  Most recent serum potassium was down to 5.1.  We will continue to monitor.    LOS: 0 Renan Danese 6/10/20198:25 AM

## 2017-07-27 NOTE — Progress Notes (Signed)
Pulmonary Critical Care Medicine [facility] [place]   PULMONARY SERVICE  PROGRESS NOTE  Date of Service: 07/27/2017  Melvin Kelley  MWN:[id number]  DOB: [birth year]   DOA: 06/16/2017  Referring Physician: Carron Curie, MD  HPI: Melvin Kelley is a 62 y.o. male seen for follow up of Acute on Chronic Respiratory Failure.  Patient is on T collar right now 20% FiO2 resting on the ventilator at nighttime.  Medications: Reviewed on Rounds  Physical Exam:  Vitals: Temperature 96.2 pulse 99 respiratory 24 blood pressure 127/62 saturations 99%  Ventilator Settings currently is on T collar trials  . General: Comfortable at this time . Eyes: Grossly normal lids, irises & conjunctiva . ENT: grossly tongue is normal . Neck: no obvious mass . Cardiovascular: S1 S2 normal no gallop . Respiratory: No rhonchi expansion is equal . Abdomen: soft . Skin: no rash seen on limited exam . Musculoskeletal: not rigid . Psychiatric:unable to assess . Neurologic: no seizure no involuntary movements         Lab Data:   Basic Metabolic Panel: Recent Labs  Lab 07/21/17 0645 07/22/17 0759 07/22/17 1101 07/23/17 0509 07/25/17 0826  NA 123*  --  127* 128* 127*  K 5.8*  --  4.9 5.8* 5.1  CL 89*  --  94* 93* 97*  CO2 24  --  25 23 22   GLUCOSE 142*  --  153* 83 58*  BUN 110*  --  68* 78* 58*  CREATININE 2.96*  --  2.22* 2.18* 1.54*  CALCIUM 8.5*  --  8.3* 8.8* 9.2  MG  --  2.3  --   --  2.2  PHOS 8.1*  --   --  5.5* 5.3*    Liver Function Tests: Recent Labs  Lab 07/21/17 0645 07/23/17 0509 07/25/17 0826  ALBUMIN 2.2* 2.5* 2.6*   No results for input(s): LIPASE, AMYLASE in the last 168 hours. No results for input(s): AMMONIA in the last 168 hours.  CBC: Recent Labs  Lab 07/21/17 0645 07/22/17 0759 07/23/17 0509 07/25/17 0826  WBC 11.2* 12.7* 10.6* 10.8*  HGB 6.4* 8.2* 8.4* 8.7*  HCT 20.4* 25.9* 27.0* 28.1*  MCV 86.8 82.2 83.3 83.6  PLT 197 139* 196 157     Cardiac Enzymes: No results for input(s): CKTOTAL, CKMB, CKMBINDEX, TROPONINI in the last 168 hours.  BNP (last 3 results) No results for input(s): BNP in the last 8760 hours.  ProBNP (last 3 results) No results for input(s): PROBNP in the last 8760 hours.  Radiological Exams: No results found.  Assessment/Plan Active Problems:   Cardiac arrest (HCC)   Acute on chronic respiratory failure (HCC)   CHF (congestive heart failure) (HCC)   Obstructive sleep apnea   Acute kidney injury (HCC)   1. Acute on chronic respiratory failure with hypoxia we will continue with weaning on T collar rest on the ventilator at nighttime patient's at baseline 2. Congestive heart failure we will continue supportive care monitor fluids 3. Acute renal failure with dialysis nephrology following along 4. Cardiac arrest at baseline we will continue present management   I have personally seen and evaluated the patient, evaluated laboratory and imaging results, formulated the assessment and plan and placed orders. The Patient requires high complexity decision making for assessment and support.  Case was discussed on Rounds with the Respiratory Therapy Staff  Yevonne Pax, MD [facility] Pulmonary Critical Care Medicine Sleep Medicine

## 2017-07-28 LAB — OCCULT BLOOD X 1 CARD TO LAB, STOOL: Fecal Occult Bld: POSITIVE — AB

## 2017-07-28 LAB — RENAL FUNCTION PANEL
Albumin: 2.6 g/dL — ABNORMAL LOW (ref 3.5–5.0)
Anion gap: 6 (ref 5–15)
BUN: 35 mg/dL — AB (ref 6–20)
CALCIUM: 8.5 mg/dL — AB (ref 8.9–10.3)
CO2: 27 mmol/L (ref 22–32)
CREATININE: 0.97 mg/dL (ref 0.61–1.24)
Chloride: 98 mmol/L — ABNORMAL LOW (ref 101–111)
GFR calc non Af Amer: >60 mL/min (ref >60)
GLUCOSE: 165 mg/dL — AB (ref 65–99)
Phosphorus: 3.1 mg/dL (ref 2.5–4.6)
Potassium: 3.9 mmol/L (ref 3.5–5.1)
SODIUM: 131 mmol/L — AB (ref 135–145)

## 2017-07-28 LAB — CBC
HCT: 17 % — ABNORMAL LOW (ref 39.0–52.0)
Hemoglobin: 5.6 g/dL — CL (ref 13.0–17.0)
MCH: 26.8 pg (ref 26.0–34.0)
MCHC: 32.9 g/dL (ref 30.0–36.0)
MCV: 81.3 fL (ref 78.0–100.0)
PLATELETS: 135 10*3/uL — AB (ref 150–400)
RBC: 2.09 MIL/uL — AB (ref 4.22–5.81)
RDW: 17.7 % — ABNORMAL HIGH (ref 11.5–15.5)
WBC: 12.2 10*3/uL — ABNORMAL HIGH (ref 4.0–10.5)

## 2017-07-28 LAB — HEMOGLOBIN AND HEMATOCRIT, BLOOD
HEMATOCRIT: 17.8 % — AB (ref 39.0–52.0)
Hemoglobin: 5.6 g/dL — CL (ref 13.0–17.0)

## 2017-07-28 LAB — PREPARE RBC (CROSSMATCH)

## 2017-07-28 LAB — MAGNESIUM: Magnesium: 1.8 mg/dL (ref 1.7–2.4)

## 2017-07-28 NOTE — Progress Notes (Signed)
Pulmonary Critical Care Medicine [facility] [place]   PULMONARY SERVICE  PROGRESS NOTE  Date of Service: 07/28/2017  Melvin Kelley  JOA:[id number]  DOB: 1955-05-07   DOA: Jun 14, 2017  Referring Physician: Carron Curie, MD  HPI: Melvin Kelley is a 62 y.o. male seen for follow up of Acute on Chronic Respiratory Failure.  Patient's been having difficulty maintaining blood pressure.  Right now is being dialyzed but no fluid is being removed.  Remains on the ventilator did not tolerate weaning today.  Medications: Reviewed on Rounds  Physical Exam:  Vitals: Temperature 97.9 pulse 106 respiratory rate 21 blood pressure 111/46 saturations 100%  Ventilator Settings mode of ventilation assist control FiO2 20% tidal volume 444cc PEEP 5  . General: Comfortable at this time . Eyes: Grossly normal lids, irises & conjunctiva . ENT: grossly tongue is normal . Neck: no obvious mass . Cardiovascular: S1 S2 normal no gallop . Respiratory: Coarse breath sounds are noted bilaterally. . Abdomen: soft . Skin: no rash seen on limited exam . Musculoskeletal: not rigid . Psychiatric:unable to assess . Neurologic: no seizure no involuntary movements         Lab Data:   Basic Metabolic Panel: Recent Labs  Lab 07/22/17 0759 07/22/17 1101 07/23/17 0509 07/25/17 0826 07/28/17 0507  NA  --  127* 128* 127*  --   K  --  4.9 5.8* 5.1  --   CL  --  94* 93* 97*  --   CO2  --  25 23 22   --   GLUCOSE  --  153* 83 58*  --   BUN  --  68* 78* 58*  --   CREATININE  --  2.22* 2.18* 1.54*  --   CALCIUM  --  8.3* 8.8* 9.2  --   MG 2.3  --   --  2.2 1.8  PHOS  --   --  5.5* 5.3*  --     Liver Function Tests: Recent Labs  Lab 07/23/17 0509 07/25/17 0826  ALBUMIN 2.5* 2.6*   No results for input(s): LIPASE, AMYLASE in the last 168 hours. No results for input(s): AMMONIA in the last 168 hours.  CBC: Recent Labs  Lab 07/22/17 0759 07/23/17 0509 07/25/17 0826 07/28/17 0507  WBC  12.7* 10.6* 10.8* 12.2*  HGB 8.2* 8.4* 8.7* 5.6*  HCT 25.9* 27.0* 28.1* 17.0*  MCV 82.2 83.3 83.6 81.3  PLT 139* 196 157 135*    Cardiac Enzymes: No results for input(s): CKTOTAL, CKMB, CKMBINDEX, TROPONINI in the last 168 hours.  BNP (last 3 results) No results for input(s): BNP in the last 8760 hours.  ProBNP (last 3 results) No results for input(s): PROBNP in the last 8760 hours.  Radiological Exams: No results found.  Assessment/Plan Active Problems:   Cardiac arrest (HCC)   Acute on chronic respiratory failure (HCC)   CHF (congestive heart failure) (HCC)   Obstructive sleep apnea   Acute kidney injury (HCC)   1. Acute on chronic respiratory failure with hypoxia we will continue with full support at this time.  Not able to wean because of low blood pressure.  Continue pulmonary toilet supportive care 2. Congestive heart failure at baseline monitor fluid status since patient's blood pressures been running low. 3. Acute renal failure followed by nephrology for dialysis 4. Sleep apnea currently nonissue 5. Status post cardiac arrest prognosis guarded   I have personally seen and evaluated the patient, evaluated laboratory and imaging results, formulated the assessment and plan and  placed orders. The Patient requires high complexity decision making for assessment and support.  Case was discussed on Rounds with the Respiratory Therapy Staff  Allyne Gee, MD [facility] Pulmonary Critical Care Medicine Sleep Medicine

## 2017-07-29 ENCOUNTER — Other Ambulatory Visit (HOSPITAL_COMMUNITY): Payer: Self-pay

## 2017-07-29 LAB — URINALYSIS, ROUTINE W REFLEX MICROSCOPIC
GLUCOSE, UA: NEGATIVE mg/dL
Ketones, ur: NEGATIVE mg/dL
Nitrite: NEGATIVE
PH: 6 (ref 5.0–8.0)
Protein, ur: 100 mg/dL — AB
Specific Gravity, Urine: 1.02 (ref 1.005–1.030)

## 2017-07-29 LAB — TYPE AND SCREEN
ABO/RH(D): B POS
ANTIBODY SCREEN: NEGATIVE
UNIT DIVISION: 0
Unit division: 0

## 2017-07-29 LAB — BPAM RBC
BLOOD PRODUCT EXPIRATION DATE: 201907082359
Blood Product Expiration Date: 201907082359
ISSUE DATE / TIME: 201906111546
ISSUE DATE / TIME: 201906112254
UNIT TYPE AND RH: 7300
Unit Type and Rh: 7300

## 2017-07-29 LAB — CBC
HEMATOCRIT: 25.6 % — AB (ref 39.0–52.0)
Hemoglobin: 8.3 g/dL — ABNORMAL LOW (ref 13.0–17.0)
MCH: 27.9 pg (ref 26.0–34.0)
MCHC: 32.4 g/dL (ref 30.0–36.0)
MCV: 85.9 fL (ref 78.0–100.0)
Platelets: 107 10*3/uL — ABNORMAL LOW (ref 150–400)
RBC: 2.98 MIL/uL — AB (ref 4.22–5.81)
RDW: 17.2 % — ABNORMAL HIGH (ref 11.5–15.5)
WBC: 39.3 10*3/uL — AB (ref 4.0–10.5)

## 2017-07-29 LAB — URINALYSIS, MICROSCOPIC (REFLEX): WBC, UA: >50 WBC/hpf (ref 0–5)

## 2017-07-29 LAB — C DIFFICILE QUICK SCREEN W PCR REFLEX
C DIFFICILE (CDIFF) TOXIN: NEGATIVE
C Diff antigen: POSITIVE — AB

## 2017-07-29 LAB — CLOSTRIDIUM DIFFICILE BY PCR, REFLEXED: Toxigenic C. Difficile by PCR: NEGATIVE

## 2017-07-29 NOTE — Progress Notes (Signed)
[facility]  ROUNDING NOTE   Subjective:  Patient deteriorating at the moment. He is currently back on the ventilator. Due for dialysis again tomorrow.  Objective:  Vital signs in last 24 hours:  Temperature 97.2 pulse 97 respirations 32 blood pressure 90/66  Physical Exam: General: Chronically ill-appearing  Head: Severe temporal wasting, NG in place  Eyes: Anicteric  Neck: Tracheostomy in place  Lungs:  Bilateral rhonchi, normal effort  Heart: S1S2 no rubs  Abdomen:  Soft, nontender, bowel sounds present  Extremities: No peripheral edema.  Neurologic: Lethargic, difficult to arouse  Skin: No rash  Access: Right subclavain temporary dialysis catheter    Basic Metabolic Panel: Recent Labs  Lab 07/23/17 0509 07/25/17 0826 07/28/17 0507 07/28/17 1013  NA 128* 127*  --  131*  K 5.8* 5.1  --  3.9  CL 93* 97*  --  98*  CO2 23 22  --  27  GLUCOSE 83 58*  --  165*  BUN 78* 58*  --  35*  CREATININE 2.18* 1.54*  --  0.97  CALCIUM 8.8* 9.2  --  8.5*  MG  --  2.2 1.8  --   PHOS 5.5* 5.3*  --  3.1    Liver Function Tests: Recent Labs  Lab 07/23/17 0509 07/25/17 0826 07/28/17 1013  ALBUMIN 2.5* 2.6* 2.6*   No results for input(s): LIPASE, AMYLASE in the last 168 hours. No results for input(s): AMMONIA in the last 168 hours.  CBC: Recent Labs  Lab 07/23/17 0509 07/25/17 0826 07/28/17 0507 07/28/17 1013 07/29/17 0807  WBC 10.6* 10.8* 12.2*  --  39.3*  HGB 8.4* 8.7* 5.6* 5.6* 8.3*  HCT 27.0* 28.1* 17.0* 17.8* 25.6*  MCV 83.3 83.6 81.3  --  85.9  PLT 196 157 135*  --  107*    Cardiac Enzymes: No results for input(s): CKTOTAL, CKMB, CKMBINDEX, TROPONINI in the last 168 hours.  BNP: Invalid input(s): POCBNP  CBG: No results for input(s): GLUCAP in the last 168 hours.  Microbiology: Results for orders placed or performed during the hospital encounter of 05/24/17  MRSA PCR Screening     Status: Abnormal   Collection Time: 05/24/17  6:01 AM   Result Value Ref Range Status   MRSA by PCR POSITIVE (A) NEGATIVE Final    Comment:        The GeneXpert MRSA Assay (FDA approved for NASAL specimens only), is one component of a comprehensive MRSA colonization surveillance program. It is not intended to diagnose MRSA infection nor to guide or monitor treatment for MRSA infections. RESULT CALLED TO, READ BACK BY AND VERIFIED WITHAlgis Downs [facility] RN AT [phone] 05/24/17 BY A.DAVIS Performed at [facility] Lab, [address], [place], [place] [address]   Fungus culture, blood     Status: None   Collection Time: 05/24/17  6:15 AM  Result Value Ref Range Status   Specimen Description BLOOD RIGHT HAND  Final   Special Requests   Final    BOTTLES DRAWN AEROBIC ONLY Blood Culture results may not be optimal due to an inadequate volume of blood received in culture bottles   Culture   Final    NO GROWTH 7 DAYS NO FUNGUS ISOLATED Performed at [facility], [address], [place], [place] [address]    Report Status 05/31/2017 FINAL  Final  Culture, blood (routine x 2)     Status: None   Collection Time: 05/24/17  6:20 AM  Result Value Ref  Range Status   Specimen Description BLOOD RIGHT HAND  Final   Special Requests   Final    BOTTLES DRAWN AEROBIC ONLY Blood Culture results may not be optimal due to an inadequate volume of blood received in culture bottles   Culture   Final    NO GROWTH 5 DAYS Performed at [facility] Lab, [address] [place], [place] [address]    Report Status 06/10/2017 FINAL  Final  Culture, blood (routine x 2)     Status: None   Collection Time: 05/24/17  6:41 AM  Result Value Ref Range Status   Specimen Description BLOOD LEFT ANTECUBITAL  Final   Special Requests   Final    BOTTLES DRAWN AEROBIC ONLY Blood Culture results may not be optimal due to an inadequate volume of blood received in culture bottles   Culture   Final    NO GROWTH 5 DAYS Performed at [facility], [address]  [place], [place] [address]    Report Status 10-Jun-2017 FINAL  Final  Culture, respiratory (NON-Expectorated)     Status: None   Collection Time: 05/24/17  3:25 PM  Result Value Ref Range Status   Specimen Description TRACHEAL ASPIRATE  Final   Special Requests NONE  Final   Gram Stain   Final    ABUNDANT WBC PRESENT,BOTH PMN AND MONONUCLEAR RARE SQUAMOUS EPITHELIAL CELLS PRESENT RARE GRAM POSITIVE COCCI IN PAIRS IN CHAINS RARE GRAM NEGATIVE COCCOBACILLI    Culture   Final    FEW Consistent with normal respiratory flora. Performed at [facility] Lab, [address], [place], [place] [address]    Report Status 05/27/2017 FINAL  Final    Coagulation Studies: No results for input(s): LABPROT, INR in the last 72 hours.  Urinalysis: No results for input(s): COLORURINE, LABSPEC, PHURINE, GLUCOSEU, HGBUR, BILIRUBINUR, KETONESUR, PROTEINUR, UROBILINOGEN, NITRITE, LEUKOCYTESUR in the last 72 hours.  Invalid input(s): APPERANCEUR    Imaging: Dg Chest Port 1 View  Result Date: 07/29/2017 CLINICAL DATA:  Shortness of breath.  Pneumonia. EXAM: PORTABLE CHEST 1 VIEW COMPARISON:  07/22/2017 and 06/24/2017 FINDINGS: Tracheostomy tube, and AICD, NG tube, and double lumen central catheter appear in good position. Tip of the central catheter is in the right atrium. NG tube tip is in the fundus of the stomach. There is a faint hazy infiltrate in the right midzone, new since the prior study. Shallow inspiration accentuates the markings at the lung bases. No bone abnormality.  Chronic gaseous distention of the bowel. IMPRESSION: New hazy infiltrate in the right midzone. Unchanged minimal atelectasis at the bases. Electronically Signed   By: Francene Boyers M.D.   On: 07/29/2017 11:55     Medications:     lidocaine (PF)  Assessment/ Plan:  62 y.o. 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   1.  Severe acute renal failure. 2.  Hyponatremia, Na 131 3.  Acute respiratory failure, worsening, back on ventilator. 4.  Anemia unspecified, Hgb 8.3 5.  Severe protein calorie malnutrition with severe emaciation, albumin 2.6 6.  Hyperkalemia, K 3.9  Plan: Critical illness persist.  In fact he has deteriorated  as compared to last visit.  He is now back on the ventilator.  Much more lethargic at the moment.  He did complete hemodialysis yesterday.  Patient was significantly hypotensive.  We will plan for dialysis again tomorrow with albumin support.  WBC count up to 39.3.  He was found to have a new hazy infiltrate in the right midlung.  Antibiotic treatment as per hospitalist.    LOS: 0 Melvin Kelley 6/12/20191:04 PM

## 2017-07-29 NOTE — Progress Notes (Signed)
Pulmonary Critical Care Medicine [facility] [place]   PULMONARY SERVICE  PROGRESS NOTE  Date of Service: 07/29/2017  Melvin Kelley  WNI:[id number]  DOB: 16-Jan-1956   DOA: 05/18/2017  Referring Physician: Carron Curie, MD  HPI: Melvin Kelley is a 62 y.o. male seen for follow up of Acute on Chronic Respiratory Failure.  Patient has not been tolerating T collar today had to be placed back on the ventilator.  Currently is on assist control mode  Medications: Reviewed on Rounds  Physical Exam:  Vitals: Temperature 97.2 pulse 97 respiratory rate 32 blood pressure 95/76 saturations 97%  Ventilator Settings mode of ventilation assist control FiO2 28% tidal volume 475cc PEEP 5  . General: Comfortable at this time . Eyes: Grossly normal lids, irises & conjunctiva . ENT: grossly tongue is normal . Neck: no obvious mass . Cardiovascular: S1 S2 normal no gallop . Respiratory: Coarse sounding breath sounds bilaterally with a few rhonchi . Abdomen: soft . Skin: no rash seen on limited exam . Musculoskeletal: not rigid . Psychiatric:unable to assess . Neurologic: no seizure no involuntary movements         Lab Data:   Basic Metabolic Panel: Recent Labs  Lab 07/23/17 0509 07/25/17 0826 07/28/17 0507 07/28/17 1013  NA 128* 127*  --  131*  K 5.8* 5.1  --  3.9  CL 93* 97*  --  98*  CO2 23 22  --  27  GLUCOSE 83 58*  --  165*  BUN 78* 58*  --  35*  CREATININE 2.18* 1.54*  --  0.97  CALCIUM 8.8* 9.2  --  8.5*  MG  --  2.2 1.8  --   PHOS 5.5* 5.3*  --  3.1    Liver Function Tests: Recent Labs  Lab 07/23/17 0509 07/25/17 0826 07/28/17 1013  ALBUMIN 2.5* 2.6* 2.6*   No results for input(s): LIPASE, AMYLASE in the last 168 hours. No results for input(s): AMMONIA in the last 168 hours.  CBC: Recent Labs  Lab 07/23/17 0509 07/25/17 0826 07/28/17 0507 07/28/17 1013 07/29/17 0807  WBC 10.6* 10.8* 12.2*  --  39.3*  HGB 8.4* 8.7* 5.6* 5.6* 8.3*  HCT 27.0*  28.1* 17.0* 17.8* 25.6*  MCV 83.3 83.6 81.3  --  85.9  PLT 196 157 135*  --  107*    Cardiac Enzymes: No results for input(s): CKTOTAL, CKMB, CKMBINDEX, TROPONINI in the last 168 hours.  BNP (last 3 results) No results for input(s): BNP in the last 8760 hours.  ProBNP (last 3 results) No results for input(s): PROBNP in the last 8760 hours.  Radiological Exams: Dg Chest Port 1 View  Result Date: 07/29/2017 CLINICAL DATA:  Shortness of breath.  Pneumonia. EXAM: PORTABLE CHEST 1 VIEW COMPARISON:  07/22/2017 and 06/24/2017 FINDINGS: Tracheostomy tube, and AICD, NG tube, and double lumen central catheter appear in good position. Tip of the central catheter is in the right atrium. NG tube tip is in the fundus of the stomach. There is a faint hazy infiltrate in the right midzone, new since the prior study. Shallow inspiration accentuates the markings at the lung bases. No bone abnormality.  Chronic gaseous distention of the bowel. IMPRESSION: New hazy infiltrate in the right midzone. Unchanged minimal atelectasis at the bases. Electronically Signed   By: Francene Boyers M.D.   On: 07/29/2017 11:55    Assessment/Plan Active Problems:   Cardiac arrest (HCC)   Acute on chronic respiratory failure (HCC)   CHF (congestive heart failure) (  HCC)   Obstructive sleep apnea   Acute kidney injury (HCC)   1. Acute on chronic respiratory failure with hypoxia we will continue with assist control mode continue pulmonary toilet supportive care patient is tolerating the assist control better than he was tolerating the T collar so we will hold off on weaning for now. 2. Congestive heart failure we will continue with supportive care 3. Acute renal failure followed by nephrology for dialysis 4. Status post cardiac arrest at baseline 5. Sleep apnea nonissue   I have personally seen and evaluated the patient, evaluated laboratory and imaging results, formulated the assessment and plan and placed orders. The  Patient requires high complexity decision making for assessment and support.  Case was discussed on Rounds with the Respiratory Therapy Staff  Yevonne Pax, MD [facility] Pulmonary Critical Care Medicine Sleep Medicine

## 2017-07-30 LAB — BLOOD CULTURE ID PANEL (REFLEXED)
Acinetobacter baumannii: NOT DETECTED
CANDIDA KRUSEI: NOT DETECTED
CANDIDA PARAPSILOSIS: NOT DETECTED
CANDIDA TROPICALIS: NOT DETECTED
CARBAPENEM RESISTANCE: NOT DETECTED
Candida albicans: NOT DETECTED
Candida glabrata: NOT DETECTED
Enterobacter cloacae complex: NOT DETECTED
Enterobacteriaceae species: DETECTED — AB
Enterococcus species: NOT DETECTED
Escherichia coli: DETECTED — AB
Haemophilus influenzae: NOT DETECTED
KLEBSIELLA PNEUMONIAE: DETECTED — AB
Klebsiella oxytoca: NOT DETECTED
Listeria monocytogenes: NOT DETECTED
Neisseria meningitidis: NOT DETECTED
PROTEUS SPECIES: NOT DETECTED
PSEUDOMONAS AERUGINOSA: NOT DETECTED
SERRATIA MARCESCENS: NOT DETECTED
STAPHYLOCOCCUS AUREUS BCID: NOT DETECTED
STAPHYLOCOCCUS SPECIES: NOT DETECTED
STREPTOCOCCUS PYOGENES: NOT DETECTED
Streptococcus agalactiae: NOT DETECTED
Streptococcus pneumoniae: NOT DETECTED
Streptococcus species: NOT DETECTED

## 2017-07-30 LAB — RENAL FUNCTION PANEL
ALBUMIN: 2 g/dL — AB (ref 3.5–5.0)
Anion gap: 11 (ref 5–15)
BUN: 72 mg/dL — ABNORMAL HIGH (ref 6–20)
CO2: 24 mmol/L (ref 22–32)
CREATININE: 2.53 mg/dL — AB (ref 0.61–1.24)
Calcium: 8.6 mg/dL — ABNORMAL LOW (ref 8.9–10.3)
Chloride: 91 mmol/L — ABNORMAL LOW (ref 101–111)
GFR calc Af Amer: 30 mL/min — ABNORMAL LOW (ref >60)
GFR, EST NON AFRICAN AMERICAN: 26 mL/min — AB (ref >60)
GLUCOSE: 232 mg/dL — AB (ref 65–99)
PHOSPHORUS: 4.6 mg/dL (ref 2.5–4.6)
Potassium: 4.4 mmol/L (ref 3.5–5.1)
SODIUM: 126 mmol/L — AB (ref 135–145)

## 2017-07-30 LAB — CBC
HEMATOCRIT: 26.8 % — AB (ref 39.0–52.0)
Hemoglobin: 8.7 g/dL — ABNORMAL LOW (ref 13.0–17.0)
MCH: 27.7 pg (ref 26.0–34.0)
MCHC: 32.5 g/dL (ref 30.0–36.0)
MCV: 85.4 fL (ref 78.0–100.0)
PLATELETS: 107 10*3/uL — AB (ref 150–400)
RBC: 3.14 MIL/uL — ABNORMAL LOW (ref 4.22–5.81)
RDW: 18.1 % — AB (ref 11.5–15.5)
WBC: 33.8 10*3/uL — AB (ref 4.0–10.5)

## 2017-07-30 NOTE — Progress Notes (Signed)
Pulmonary Critical Care Medicine [facility] [place]   PULMONARY SERVICE  PROGRESS NOTE  Date of Service: 07/30/2017  Melvin Kelley  ZVJ:[id number]  DOB: [birth year]   DOA: 06/03/2017  Referring Physician: Carron Curie, MD  HPI: Melvin Kelley is a 62 y.o. male seen for follow up of Acute on Chronic Respiratory Failure.  Patient is on full vent support has not been able to wean we will continue with full support and assist control mode right now  Medications: Reviewed on Rounds  Physical Exam:  Vitals: Temperature is 97.1 pulse 102 respiratory rate 26 blood pressure 120/55 saturations 97%  Ventilator Settings mode of ventilation assist control FiO2 28% tidal volume 513 PEEP 5  . General: Comfortable at this time . Eyes: Grossly normal lids, irises & conjunctiva . ENT: grossly tongue is normal . Neck: no obvious mass . Cardiovascular: S1 S2 normal no gallop . Respiratory: No rhonchi or rales expansion is equal . Abdomen: soft . Skin: no rash seen on limited exam . Musculoskeletal: not rigid . Psychiatric:unable to assess . Neurologic: no seizure no involuntary movements         Lab Data:   Basic Metabolic Panel: Recent Labs  Lab 07/25/17 0826 07/28/17 0507 07/28/17 1013 07/30/17 0500  NA 127*  --  131* 126*  K 5.1  --  3.9 4.4  CL 97*  --  98* 91*  CO2 22  --  27 24  GLUCOSE 58*  --  165* 232*  BUN 58*  --  35* 72*  CREATININE 1.54*  --  0.97 2.53*  CALCIUM 9.2  --  8.5* 8.6*  MG 2.2 1.8  --   --   PHOS 5.3*  --  3.1 4.6    Liver Function Tests: Recent Labs  Lab 07/25/17 0826 07/28/17 1013 07/30/17 0500  ALBUMIN 2.6* 2.6* 2.0*   No results for input(s): LIPASE, AMYLASE in the last 168 hours. No results for input(s): AMMONIA in the last 168 hours.  CBC: Recent Labs  Lab 07/25/17 0826 07/28/17 0507 07/28/17 1013 07/29/17 0807 07/30/17 0606  WBC 10.8* 12.2*  --  39.3* 33.8*  HGB 8.7* 5.6* 5.6* 8.3* 8.7*  HCT 28.1* 17.0* 17.8* 25.6*  26.8*  MCV 83.6 81.3  --  85.9 85.4  PLT 157 135*  --  107* 107*    Cardiac Enzymes: No results for input(s): CKTOTAL, CKMB, CKMBINDEX, TROPONINI in the last 168 hours.  BNP (last 3 results) No results for input(s): BNP in the last 8760 hours.  ProBNP (last 3 results) No results for input(s): PROBNP in the last 8760 hours.  Radiological Exams: Dg Chest Port 1 View  Result Date: 07/29/2017 CLINICAL DATA:  Shortness of breath.  Pneumonia. EXAM: PORTABLE CHEST 1 VIEW COMPARISON:  07/22/2017 and 06/24/2017 FINDINGS: Tracheostomy tube, and AICD, NG tube, and double lumen central catheter appear in good position. Tip of the central catheter is in the right atrium. NG tube tip is in the fundus of the stomach. There is a faint hazy infiltrate in the right midzone, new since the prior study. Shallow inspiration accentuates the markings at the lung bases. No bone abnormality.  Chronic gaseous distention of the bowel. IMPRESSION: New hazy infiltrate in the right midzone. Unchanged minimal atelectasis at the bases. Electronically Signed   By: Francene Boyers M.D.   On: 07/29/2017 11:55    Assessment/Plan Active Problems:   Cardiac arrest (HCC)   Acute on chronic respiratory failure (HCC)   CHF (congestive heart failure) (  HCC)   Obstructive sleep apnea   Acute kidney injury (HCC)   1. Acute on chronic respiratory failure with hypoxia patient will be continued on full vent support has not been tolerating the wean well at all. 2. Cardiac arrest stable at this time we will continue to follow 3. Congestive heart failure at baseline we will monitor 4. Acute renal failure followed by nephrology for dialysis 5. Sleep apnea currently nonissue   I have personally seen and evaluated the patient, evaluated laboratory and imaging results, formulated the assessment and plan and placed orders. The Patient requires high complexity decision making for assessment and support.  Case was discussed on Rounds  with the Respiratory Therapy Staff  Yevonne Pax, MD [facility] Pulmonary Critical Care Medicine Sleep Medicine

## 2017-07-31 DIAGNOSIS — I509 Heart failure, unspecified: Secondary | ICD-10-CM

## 2017-07-31 DIAGNOSIS — R6521 Severe sepsis with septic shock: Secondary | ICD-10-CM

## 2017-07-31 DIAGNOSIS — A419 Sepsis, unspecified organism: Secondary | ICD-10-CM

## 2017-07-31 LAB — MAGNESIUM: Magnesium: 2 mg/dL (ref 1.7–2.4)

## 2017-07-31 LAB — VANCOMYCIN, TROUGH: VANCOMYCIN TR: 9 ug/mL — AB (ref 15–20)

## 2017-07-31 NOTE — Progress Notes (Signed)
Pulmonary Critical Care Medicine [facility] [place]   PULMONARY SERVICE  PROGRESS NOTE  Date of Service: 07/31/2017  Melvin Kelley  ZHY:[id number]  DOB: 1955-03-14   DOA: 05/30/2017  Referring Physician: Carron Curie, MD  HPI: Melvin Kelley is a 62 y.o. male seen for follow up of Acute on Chronic Respiratory Failure.  Patient remains on the ventilator he is now also on levo fed.  Has been on full vent support and assist control mode.  Right now is on 28% oxygen.  Patient has been having issues with blood pressure so the pressors were started.  Medications: Reviewed on Rounds  Physical Exam:  Vitals: Temperature 99.7 pulse 103 respiratory rate 29 blood pressure 138/66 saturations 100%  Ventilator Settings mode of ventilation assist control FiO2 20% tidal volume 43 PEEP 5  . General: Comfortable at this time . Eyes: Grossly normal lids, irises & conjunctiva . ENT: grossly tongue is normal . Neck: no obvious mass . Cardiovascular: S1 S2 normal no gallop . Respiratory: No rhonchi expansion is equal . Abdomen: soft . Skin: no rash seen on limited exam . Musculoskeletal: not rigid . Psychiatric:unable to assess . Neurologic: no seizure no involuntary movements         Lab Data:   Basic Metabolic Panel: Recent Labs  Lab 07/25/17 0826 07/28/17 0507 07/28/17 1013 07/30/17 0500 07/31/17 0644  NA 127*  --  131* 126*  --   K 5.1  --  3.9 4.4  --   CL 97*  --  98* 91*  --   CO2 22  --  27 24  --   GLUCOSE 58*  --  165* 232*  --   BUN 58*  --  35* 72*  --   CREATININE 1.54*  --  0.97 2.53*  --   CALCIUM 9.2  --  8.5* 8.6*  --   MG 2.2 1.8  --   --  2.0  PHOS 5.3*  --  3.1 4.6  --     Liver Function Tests: Recent Labs  Lab 07/25/17 0826 07/28/17 1013 07/30/17 0500  ALBUMIN 2.6* 2.6* 2.0*   No results for input(s): LIPASE, AMYLASE in the last 168 hours. No results for input(s): AMMONIA in the last 168 hours.  CBC: Recent Labs  Lab 07/25/17 0826  07/28/17 0507 07/28/17 1013 07/29/17 0807 07/30/17 0606  WBC 10.8* 12.2*  --  39.3* 33.8*  HGB 8.7* 5.6* 5.6* 8.3* 8.7*  HCT 28.1* 17.0* 17.8* 25.6* 26.8*  MCV 83.6 81.3  --  85.9 85.4  PLT 157 135*  --  107* 107*    Cardiac Enzymes: No results for input(s): CKTOTAL, CKMB, CKMBINDEX, TROPONINI in the last 168 hours.  BNP (last 3 results) No results for input(s): BNP in the last 8760 hours.  ProBNP (last 3 results) No results for input(s): PROBNP in the last 8760 hours.  Radiological Exams: Dg Chest Port 1 View  Result Date: 07/29/2017 CLINICAL DATA:  Shortness of breath.  Pneumonia. EXAM: PORTABLE CHEST 1 VIEW COMPARISON:  07/22/2017 and 06/24/2017 FINDINGS: Tracheostomy tube, and AICD, NG tube, and double lumen central catheter appear in good position. Tip of the central catheter is in the right atrium. NG tube tip is in the fundus of the stomach. There is a faint hazy infiltrate in the right midzone, new since the prior study. Shallow inspiration accentuates the markings at the lung bases. No bone abnormality.  Chronic gaseous distention of the bowel. IMPRESSION: New hazy infiltrate in the  right midzone. Unchanged minimal atelectasis at the bases. Electronically Signed   By: Francene Boyers M.D.   On: 07/29/2017 11:55    Assessment/Plan Active Problems:   Cardiac arrest (HCC)   Acute on chronic respiratory failure (HCC)   CHF (congestive heart failure) (HCC)   Obstructive sleep apnea   Acute kidney injury (HCC)   1. Acute on chronic respiratory failure with hypoxia we will continue with full vent support at this time.  Patient has not been able to do any kind of weaning. 2. Acute renal failure patients on dialysis now requiring pressors because of low blood pressure. 3. Congestive heart failure we will continue supportive care objective fluid status. 4. Sepsis with shock patient has been started on antibiotics.  Patient also is on pressors as indicated he is critically  ill. 5. Obstructive sleep apnea nonissue 6. Status post cardiac arrest he is at risk for another event we will continue to monitor   I have personally seen and evaluated the patient, evaluated laboratory and imaging results, formulated the assessment and plan and placed orders. The Patient requires high complexity decision making for assessment and support.  Case was discussed on Rounds with the Respiratory Therapy Staff time 35 minutes critical care reviewing the chart medication titration and discussion on rounds with medical staff  Yevonne Pax, MD [facility] Pulmonary Critical Care Medicine Sleep Medicine

## 2017-07-31 NOTE — Progress Notes (Signed)
[facility]  ROUNDING NOTE   Subjective:  Patient still on the ventilator at the moment. He completed dialysis yesterday. Next dialysis scheduled for tomorrow. Remains critically ill as he's septic now.    Objective:  Vital signs in last 24 hours:  Temperature 99.7 pulse 107 respirations 29 blood pressure 138/66  Physical Exam: General: Critically ill appaering  Head: Severe temporal wasting, NG in place  Eyes: Anicteric  Neck: Tracheostomy in place  Lungs:  Bilateral rhonchi, vent assisted  Heart: S1S2 no rubs  Abdomen:  Soft, nontender, bowel sounds present  Extremities: No peripheral edema.  Neurologic: Lethargic, difficult to arouse  Skin: No rash  Access: Right subclavain temporary dialysis catheter    Basic Metabolic Panel: Recent Labs  Lab 07/25/17 0826 07/28/17 0507 07/28/17 1013 07/30/17 0500 07/31/17 0644  NA 127*  --  131* 126*  --   K 5.1  --  3.9 4.4  --   CL 97*  --  98* 91*  --   CO2 22  --  27 24  --   GLUCOSE 58*  --  165* 232*  --   BUN 58*  --  35* 72*  --   CREATININE 1.54*  --  0.97 2.53*  --   CALCIUM 9.2  --  8.5* 8.6*  --   MG 2.2 1.8  --   --  2.0  PHOS 5.3*  --  3.1 4.6  --     Liver Function Tests: Recent Labs  Lab 07/25/17 0826 07/28/17 1013 07/30/17 0500  ALBUMIN 2.6* 2.6* 2.0*   No results for input(s): LIPASE, AMYLASE in the last 168 hours. No results for input(s): AMMONIA in the last 168 hours.  CBC: Recent Labs  Lab 07/25/17 0826 07/28/17 0507 07/28/17 1013 07/29/17 0807 07/30/17 0606  WBC 10.8* 12.2*  --  39.3* 33.8*  HGB 8.7* 5.6* 5.6* 8.3* 8.7*  HCT 28.1* 17.0* 17.8* 25.6* 26.8*  MCV 83.6 81.3  --  85.9 85.4  PLT 157 135*  --  107* 107*    Cardiac Enzymes: No results for input(s): CKTOTAL, CKMB, CKMBINDEX, TROPONINI in the last 168 hours.  BNP: Invalid input(s): POCBNP  CBG: No results for input(s): GLUCAP in the last 168 hours.  Microbiology: Results for orders placed or performed  during the hospital encounter of 06/01/2017  Culture, Urine     Status: Abnormal (Preliminary result)   Collection Time: 07/29/17 12:20 PM  Result Value Ref Range Status   Specimen Description URINE, RANDOM  Final   Special Requests   Final    NONE Performed at [facility], [address] [place], [place] [address]    Culture >=100,000 COLONIES/mL ESCHERICHIA COLI (A)  Final   Report Status PENDING  Incomplete  C difficile quick scan w PCR reflex     Status: Abnormal   Collection Time: 07/29/17 12:24 PM  Result Value Ref Range Status   C Diff antigen POSITIVE (A) NEGATIVE Final   C Diff toxin NEGATIVE NEGATIVE Final   C Diff interpretation Results are indeterminate. See PCR results.  Final    Comment: Performed at [facility], [address] [place], [place] [address]  C. Diff by PCR, Reflexed     Status: None   Collection Time: 07/29/17 12:24 PM  Result Value Ref Range Status   Toxigenic C. Difficile by PCR NEGATIVE NEGATIVE Final    Comment: Patient is colonized with non toxigenic C. difficile. May not need treatment  unless significant symptoms are present. Performed at [facility], [address] [place], [place] [address]   Culture, blood (routine x 2)     Status: None (Preliminary result)   Collection Time: 07/29/17 12:37 PM  Result Value Ref Range Status   Specimen Description BLOOD LEFT ANTECUBITAL  Final   Special Requests   Final    BOTTLES DRAWN AEROBIC ONLY Blood Culture results may not be optimal due to an inadequate volume of blood received in culture bottles   Culture  Setup Time   Final    AEROBIC BOTTLE ONLY GRAM NEGATIVE RODS CRITICAL RESULT CALLED TO, READ BACK BY AND VERIFIED WITH: Hartley Barefoot RN 07/30/17 0139 JDW    Culture   Final    Romie Minus NEGATIVE RODS CULTURE REINCUBATED FOR BETTER GROWTH Performed at [facility], [address] [place], [place] [address]    Report Status PENDING  Incomplete  Culture, blood (routine x 2)      Status: None (Preliminary result)   Collection Time: 07/29/17 12:37 PM  Result Value Ref Range Status   Specimen Description BLOOD LEFT ANTECUBITAL  Final   Special Requests   Final    BOTTLES DRAWN AEROBIC ONLY Blood Culture results may not be optimal due to an inadequate volume of blood received in culture bottles   Culture  Setup Time   Final    AEROBIC BOTTLE ONLY GRAM NEGATIVE RODS CRITICAL VALUE NOTED.  VALUE IS CONSISTENT WITH PREVIOUSLY REPORTED AND CALLED VALUE. Performed at [facility], [address] [place], [place] [address]    Culture GRAM NEGATIVE RODS  Final   Report Status PENDING  Incomplete  Blood Culture ID Panel (Reflexed)     Status: Abnormal   Collection Time: 07/29/17 12:37 PM  Result Value Ref Range Status   Enterococcus species NOT DETECTED NOT DETECTED Final   Listeria monocytogenes NOT DETECTED NOT DETECTED Final   Staphylococcus species NOT DETECTED NOT DETECTED Final   Staphylococcus aureus NOT DETECTED NOT DETECTED Final   Streptococcus species NOT DETECTED NOT DETECTED Final   Streptococcus agalactiae NOT DETECTED NOT DETECTED Final   Streptococcus pneumoniae NOT DETECTED NOT DETECTED Final   Streptococcus pyogenes NOT DETECTED NOT DETECTED Final   Acinetobacter baumannii NOT DETECTED NOT DETECTED Final   Enterobacteriaceae species DETECTED (A) NOT DETECTED Final    Comment: CRITICAL RESULT CALLED TO, READ BACK BY AND VERIFIED WITH: Hartley Barefoot RN 07/30/17 0136 JDW    Enterobacter cloacae complex NOT DETECTED NOT DETECTED Final   Escherichia coli DETECTED (A) NOT DETECTED Final    Comment: CRITICAL RESULT CALLED TO, READ BACK BY AND VERIFIED WITH: Hartley Barefoot RN 07/30/17 0136 JDW    Klebsiella oxytoca NOT DETECTED NOT DETECTED Final   Klebsiella pneumoniae DETECTED (A) NOT DETECTED Final    Comment: CRITICAL RESULT CALLED TO, READ BACK BY AND VERIFIED WITH: Hartley Barefoot RN 07/30/17 0136 JDW    Proteus species NOT DETECTED NOT DETECTED Final    Serratia marcescens NOT DETECTED NOT DETECTED Final   Carbapenem resistance NOT DETECTED NOT DETECTED Final   Haemophilus influenzae NOT DETECTED NOT DETECTED Final   Neisseria meningitidis NOT DETECTED NOT DETECTED Final   Pseudomonas aeruginosa NOT DETECTED NOT DETECTED Final   Candida albicans NOT DETECTED NOT DETECTED Final   Candida glabrata NOT DETECTED NOT DETECTED Final   Candida krusei NOT DETECTED NOT DETECTED Final   Candida parapsilosis NOT DETECTED NOT DETECTED Final   Candida tropicalis NOT  DETECTED NOT DETECTED Final    Comment: Performed at [facility], [address], [place], [place] [address]    Coagulation Studies: No results for input(s): LABPROT, INR in the last 72 hours.  Urinalysis: Recent Labs    07/29/17 1317  COLORURINE BROWN*  LABSPEC 1.020  PHURINE 6.0  GLUCOSEU NEGATIVE  HGBUR LARGE*  BILIRUBINUR MODERATE*  KETONESUR NEGATIVE  PROTEINUR 100*  NITRITE NEGATIVE  LEUKOCYTESUR LARGE*      Imaging: Dg Chest Port 1 View  Result Date: 07/29/2017 CLINICAL DATA:  Shortness of breath.  Pneumonia. EXAM: PORTABLE CHEST 1 VIEW COMPARISON:  07/22/2017 and 06/24/2017 FINDINGS: Tracheostomy tube, and AICD, NG tube, and double lumen central catheter appear in good position. Tip of the central catheter is in the right atrium. NG tube tip is in the fundus of the stomach. There is a faint hazy infiltrate in the right midzone, new since the prior study. Shallow inspiration accentuates the markings at the lung bases. No bone abnormality.  Chronic gaseous distention of the bowel. IMPRESSION: New hazy infiltrate in the right midzone. Unchanged minimal atelectasis at the bases. Electronically Signed   By: Francene Boyers M.D.   On: 07/29/2017 11:55     Medications:     lidocaine (PF)  Assessment/ Plan:  62 y.o. 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   1.  Severe acute renal failure. 2.  Hyponatremia, Na 126 3.  Acute respiratory failure, worsening, back on ventilator. 4.  Anemia unspecified, Hgb 8.7 5.  Severe protein calorie malnutrition with severe emaciation, albumin 2.0 6.  Hyperkalemia, resolved 7.  multiorganism sepsis.   Plan: Patient remains critically ill at the moment.  He has sepsis with multi organisms in the blood.  E. coli was noted to be in the urine.  WBC count still high at 33.8.  Antibiotic management as per hospitalist.  We will plan for dialysis tomorrow and recommend removal of dialysis catheter after treatment tomorrow.  Patient is very high risk for further decompensation.    LOS: 0 Kannan Proia 6/14/20198:35 AM

## 2017-08-01 LAB — CBC
HCT: 24.6 % — ABNORMAL LOW (ref 39.0–52.0)
HEMOGLOBIN: 7.7 g/dL — AB (ref 13.0–17.0)
MCH: 26.7 pg (ref 26.0–34.0)
MCHC: 31.3 g/dL (ref 30.0–36.0)
MCV: 85.4 fL (ref 78.0–100.0)
PLATELETS: 45 10*3/uL — AB (ref 150–400)
RBC: 2.88 MIL/uL — AB (ref 4.22–5.81)
RDW: 18.5 % — ABNORMAL HIGH (ref 11.5–15.5)
WBC: 11.3 10*3/uL — ABNORMAL HIGH (ref 4.0–10.5)

## 2017-08-01 LAB — RENAL FUNCTION PANEL
ANION GAP: 8 (ref 5–15)
Albumin: 2 g/dL — ABNORMAL LOW (ref 3.5–5.0)
BUN: 60 mg/dL — ABNORMAL HIGH (ref 6–20)
CHLORIDE: 97 mmol/L — AB (ref 101–111)
CO2: 23 mmol/L (ref 22–32)
Calcium: 8.2 mg/dL — ABNORMAL LOW (ref 8.9–10.3)
Creatinine, Ser: 2.06 mg/dL — ABNORMAL HIGH (ref 0.61–1.24)
GFR calc non Af Amer: 33 mL/min — ABNORMAL LOW (ref >60)
GFR, EST AFRICAN AMERICAN: 39 mL/min — AB (ref >60)
Glucose, Bld: 242 mg/dL — ABNORMAL HIGH (ref 65–99)
Phosphorus: 4.7 mg/dL — ABNORMAL HIGH (ref 2.5–4.6)
Potassium: 5.1 mmol/L (ref 3.5–5.1)
Sodium: 128 mmol/L — ABNORMAL LOW (ref 135–145)

## 2017-08-01 LAB — CULTURE, BLOOD (ROUTINE X 2)

## 2017-08-01 LAB — URINE CULTURE: Culture: >=100000 — AB

## 2017-08-01 NOTE — Progress Notes (Signed)
Pulmonary Critical Care Medicine [facility] [place]   PULMONARY SERVICE  PROGRESS NOTE  Date of Service: 08/01/2017  Melvin Kelley  BVQ:[id number]  DOB: 11/11/1955   DOA: 06/04/2017  Referring Physician: Carron Curie, MD  HPI: Melvin Kelley is a 62 y.o. male seen for follow up of Acute on Chronic Respiratory Failure.  Patient is on assist control mode right now has been on 28% oxygen good saturations are noted.  Medications: Reviewed on Rounds  Physical Exam:  Vitals: Temperature 99.1 pulse 96 respiratory rate 29 blood pressure 137/65 saturations 98%  Ventilator Settings mode of ventilation assist control FiO2 28% tidal volume 387 PEEP 5  . General: Comfortable at this time . Eyes: Grossly normal lids, irises & conjunctiva . ENT: grossly tongue is normal . Neck: no obvious mass . Cardiovascular: S1 S2 normal no gallop . Respiratory: Coarse breath sounds noted bilaterally . Abdomen: soft . Skin: no rash seen on limited exam . Musculoskeletal: not rigid . Psychiatric:unable to assess . Neurologic: no seizure no involuntary movements         Lab Data:   Basic Metabolic Panel: Recent Labs  Lab 07/28/17 0507 07/28/17 1013 07/30/17 0500 07/31/17 0644 08/01/17 0633  NA  --  131* 126*  --  128*  K  --  3.9 4.4  --  5.1  CL  --  98* 91*  --  97*  CO2  --  27 24  --  23  GLUCOSE  --  165* 232*  --  242*  BUN  --  35* 72*  --  60*  CREATININE  --  0.97 2.53*  --  2.06*  CALCIUM  --  8.5* 8.6*  --  8.2*  MG 1.8  --   --  2.0  --   PHOS  --  3.1 4.6  --  4.7*    Liver Function Tests: Recent Labs  Lab 07/28/17 1013 07/30/17 0500 08/01/17 0633  ALBUMIN 2.6* 2.0* 2.0*   No results for input(s): LIPASE, AMYLASE in the last 168 hours. No results for input(s): AMMONIA in the last 168 hours.  CBC: Recent Labs  Lab 07/28/17 0507 07/28/17 1013 07/29/17 0807 07/30/17 0606 08/01/17 0633  WBC 12.2*  --  39.3* 33.8* 11.3*  HGB 5.6* 5.6* 8.3* 8.7*  7.7*  HCT 17.0* 17.8* 25.6* 26.8* 24.6*  MCV 81.3  --  85.9 85.4 85.4  PLT 135*  --  107* 107* 45*    Cardiac Enzymes: No results for input(s): CKTOTAL, CKMB, CKMBINDEX, TROPONINI in the last 168 hours.  BNP (last 3 results) No results for input(s): BNP in the last 8760 hours.  ProBNP (last 3 results) No results for input(s): PROBNP in the last 8760 hours.  Radiological Exams: No results found.  Assessment/Plan Active Problems:   Cardiac arrest (HCC)   Acute on chronic respiratory failure (HCC)   CHF (congestive heart failure) (HCC)   Obstructive sleep apnea   Acute kidney injury (HCC)   1. Acute on chronic respiratory failure with hypoxia we will continue with full vent support has been failing weaning attempts.  We will continue secretion management pulmonary toilet prognosis quite guarded. 2. Congestive heart failure at baseline we will continue with fluid management 3. Acute renal failure on dialysis followed by nephrology 4. Sleep apnea nonissue 5. Cardiac arrest patient is at baseline   I have personally seen and evaluated the patient, evaluated laboratory and imaging results, formulated the assessment and plan and placed orders. The Patient  requires high complexity decision making for assessment and support.  Case was discussed on Rounds with the Respiratory Therapy Staff  Allyne Gee, MD [facility] Pulmonary Critical Care Medicine Sleep Medicine

## 2017-08-03 LAB — VANCOMYCIN, TROUGH: VANCOMYCIN TR: 10 ug/mL — AB (ref 15–20)

## 2017-08-03 LAB — MAGNESIUM: Magnesium: 1.7 mg/dL (ref 1.7–2.4)

## 2017-08-03 NOTE — Progress Notes (Signed)
Pulmonary Critical Care Medicine [facility]   PULMONARY SERVICE  PROGRESS NOTE  Date of Service: 08/03/2017  Melvin Kelley  DHR:[id number]  DOB: [birth year]   DOA: 06/04/2017  Referring Physician: Carron Curie, MD  HPI: Melvin Kelley is a 62 y.o. male seen for follow up of Acute on Chronic Respiratory Failure.  Patient right now is on full vent support.  Was not tolerating weaning.  Remains on Levophed for blood pressure issues.  Medications: Reviewed on Rounds  Physical Exam:  Vitals: Temperature 97.6 pulse 92 respiratory rate 18 blood pressure 131/60 saturations 99%  Ventilator Settings mode of ventilation assist control FiO2 28% tidal volume 450 PEEP 5  . General: Comfortable at this time . Eyes: Grossly normal lids, irises & conjunctiva . ENT: grossly tongue is normal . Neck: no obvious mass . Cardiovascular: S1 S2 normal no gallop . Respiratory: No rhonchi are noted at this time . Abdomen: soft . Skin: no rash seen on limited exam . Musculoskeletal: not rigid . Psychiatric:unable to assess . Neurologic: no seizure no involuntary movements         Lab Data:   Basic Metabolic Panel: Recent Labs  Lab 07/28/17 0507 07/28/17 1013 07/30/17 0500 07/31/17 0644 08/01/17 0633 08/03/17 0548  NA  --  131* 126*  --  128*  --   K  --  3.9 4.4  --  5.1  --   CL  --  98* 91*  --  97*  --   CO2  --  27 24  --  23  --   GLUCOSE  --  165* 232*  --  242*  --   BUN  --  35* 72*  --  60*  --   CREATININE  --  0.97 2.53*  --  2.06*  --   CALCIUM  --  8.5* 8.6*  --  8.2*  --   MG 1.8  --   --  2.0  --  1.7  PHOS  --  3.1 4.6  --  4.7*  --     Liver Function Tests: Recent Labs  Lab 07/28/17 1013 07/30/17 0500 08/01/17 0633  ALBUMIN 2.6* 2.0* 2.0*   No results for input(s): LIPASE, AMYLASE in the last 168 hours. No results for input(s): AMMONIA in the last 168 hours.  CBC: Recent Labs  Lab 07/28/17 0507 07/28/17 1013 07/29/17 0807  07/30/17 0606 08/01/17 0633  WBC 12.2*  --  39.3* 33.8* 11.3*  HGB 5.6* 5.6* 8.3* 8.7* 7.7*  HCT 17.0* 17.8* 25.6* 26.8* 24.6*  MCV 81.3  --  85.9 85.4 85.4  PLT 135*  --  107* 107* 45*    Cardiac Enzymes: No results for input(s): CKTOTAL, CKMB, CKMBINDEX, TROPONINI in the last 168 hours.  BNP (last 3 results) No results for input(s): BNP in the last 8760 hours.  ProBNP (last 3 results) No results for input(s): PROBNP in the last 8760 hours.  Radiological Exams: No results found.  Assessment/Plan Active Problems:   Cardiac arrest (HCC)   Acute on chronic respiratory failure (HCC)   CHF (congestive heart failure) (HCC)   Obstructive sleep apnea   Acute kidney injury (HCC)   1. Acute on chronic respiratory failure with hypoxia we will continue with full vent support not able to wean at this stage.  Continue pulmonary toilet secretion management prognosis guarded for further weaning. 2. Congestive heart failure continue to monitor fluid status 3. Hypotension patient's been requiring Levophed we will continue with  supportive care prognosis guarded. 4. Status post cardiac arrest at baseline 5. Sleep apnea nonissue 6. Acute renal failure followed by nephrology for dialysis continue with supportive care   I have personally seen and evaluated the patient, evaluated laboratory and imaging results, formulated the assessment and plan and placed orders. The Patient requires high complexity decision making for assessment and support.  Case was discussed on Rounds with the Respiratory Therapy Staff  Yevonne Pax, MD [facility] Pulmonary Critical Care Medicine Sleep Medicine

## 2017-08-03 NOTE — Progress Notes (Signed)
Temporary right subclavian dialysis catheter removed without complication. Hemostasis achieved.  Loyce Dys, MS RD PA-C

## 2017-08-03 NOTE — Progress Notes (Signed)
[facility]  ROUNDING NOTE   Subjective:  Patient completed hemodialysis yesterday. Dialysis catheter to be removed today. Patient remains critically ill and is currently on norepinephrine.  Objective:  Vital signs in last 24 hours:  Temperature 97.6 pulse 92 respirations 18 blood pressure 131/60  Physical Exam: General: Critically ill appaering  Head: Severe temporal wasting, NG in place  Eyes: Anicteric  Neck: Tracheostomy in place  Lungs:  Bilateral rhonchi, vent assisted  Heart: S1S2 no rubs  Abdomen:  Soft, nontender, bowel sounds present  Extremities: No peripheral edema.  Neurologic: Awake, not following commands  Skin: No rash  Access: Right subclavain temporary dialysis catheter    Basic Metabolic Panel: Recent Labs  Lab 07/28/17 0507 07/28/17 1013 07/30/17 0500 07/31/17 0644 08/01/17 0633 08/03/17 0548  NA  --  131* 126*  --  128*  --   K  --  3.9 4.4  --  5.1  --   CL  --  98* 91*  --  97*  --   CO2  --  27 24  --  23  --   GLUCOSE  --  165* 232*  --  242*  --   BUN  --  35* 72*  --  60*  --   CREATININE  --  0.97 2.53*  --  2.06*  --   CALCIUM  --  8.5* 8.6*  --  8.2*  --   MG 1.8  --   --  2.0  --  1.7  PHOS  --  3.1 4.6  --  4.7*  --     Liver Function Tests: Recent Labs  Lab 07/28/17 1013 07/30/17 0500 08/01/17 0633  ALBUMIN 2.6* 2.0* 2.0*   No results for input(s): LIPASE, AMYLASE in the last 168 hours. No results for input(s): AMMONIA in the last 168 hours.  CBC: Recent Labs  Lab 07/28/17 0507 07/28/17 1013 07/29/17 0807 07/30/17 0606 08/01/17 0633  WBC 12.2*  --  39.3* 33.8* 11.3*  HGB 5.6* 5.6* 8.3* 8.7* 7.7*  HCT 17.0* 17.8* 25.6* 26.8* 24.6*  MCV 81.3  --  85.9 85.4 85.4  PLT 135*  --  107* 107* 45*    Cardiac Enzymes: No results for input(s): CKTOTAL, CKMB, CKMBINDEX, TROPONINI in the last 168 hours.  BNP: Invalid input(s): POCBNP  CBG: No results for input(s): GLUCAP in the last 168  hours.  Microbiology: Results for orders placed or performed during the hospital encounter of 05/20/2017  Culture, Urine     Status: Abnormal   Collection Time: 07/29/17 12:20 PM  Result Value Ref Range Status   Specimen Description URINE, RANDOM  Final   Special Requests NONE  Final   Culture (A)  Final    >=100,000 COLONIES/mL ESCHERICHIA COLI >=100,000 COLONIES/mL KLEBSIELLA PNEUMONIAE Confirmed Extended Spectrum Beta-Lactamase Producer (ESBL).  In bloodstream infections from ESBL organisms, carbapenems are preferred over piperacillin/tazobactam. They are shown to have a lower risk of mortality. Performed at [facility], [address], [place], [place] [address]    Report Status 08/01/2017 FINAL  Final   Organism ID, Bacteria ESCHERICHIA COLI (A)  Final   Organism ID, Bacteria KLEBSIELLA PNEUMONIAE (A)  Final      Susceptibility   Escherichia coli - MIC*    AMPICILLIN >=32 RESISTANT Resistant     CEFAZOLIN <=4 SENSITIVE Sensitive     CEFTRIAXONE <=1 SENSITIVE Sensitive     CIPROFLOXACIN <=0.25 SENSITIVE Sensitive     GENTAMICIN <=1 SENSITIVE  Sensitive     IMIPENEM <=0.25 SENSITIVE Sensitive     NITROFURANTOIN <=16 SENSITIVE Sensitive     TRIMETH/SULFA >=320 RESISTANT Resistant     AMPICILLIN/SULBACTAM 16 INTERMEDIATE Intermediate     PIP/TAZO <=4 SENSITIVE Sensitive     Extended ESBL NEGATIVE Sensitive     * >=100,000 COLONIES/mL ESCHERICHIA COLI   Klebsiella pneumoniae - MIC*    AMPICILLIN >=32 RESISTANT Resistant     CEFAZOLIN >=64 RESISTANT Resistant     CEFTRIAXONE >=64 RESISTANT Resistant     CIPROFLOXACIN 2 INTERMEDIATE Intermediate     GENTAMICIN <=1 SENSITIVE Sensitive     IMIPENEM <=0.25 SENSITIVE Sensitive     NITROFURANTOIN 32 SENSITIVE Sensitive     TRIMETH/SULFA >=320 RESISTANT Resistant     AMPICILLIN/SULBACTAM >=32 RESISTANT Resistant     PIP/TAZO 32 INTERMEDIATE Intermediate     Extended ESBL POSITIVE Resistant     * >=100,000 COLONIES/mL  KLEBSIELLA PNEUMONIAE  C difficile quick scan w PCR reflex     Status: Abnormal   Collection Time: 07/29/17 12:24 PM  Result Value Ref Range Status   C Diff antigen POSITIVE (A) NEGATIVE Final   C Diff toxin NEGATIVE NEGATIVE Final   C Diff interpretation Results are indeterminate. See PCR results.  Final    Comment: Performed at [facility], [address] [place], [place] [address]  C. Diff by PCR, Reflexed     Status: None   Collection Time: 07/29/17 12:24 PM  Result Value Ref Range Status   Toxigenic C. Difficile by PCR NEGATIVE NEGATIVE Final    Comment: Patient is colonized with non toxigenic C. difficile. May not need treatment unless significant symptoms are present. Performed at [facility], [address] [place], [place] [address]   Culture, blood (routine x 2)     Status: Abnormal   Collection Time: 07/29/17 12:37 PM  Result Value Ref Range Status   Specimen Description BLOOD LEFT ANTECUBITAL  Final   Special Requests   Final    BOTTLES DRAWN AEROBIC ONLY Blood Culture results may not be optimal due to an inadequate volume of blood received in culture bottles   Culture  Setup Time   Final    AEROBIC BOTTLE ONLY GRAM NEGATIVE RODS CRITICAL RESULT CALLED TO, READ BACK BY AND VERIFIED WITH: Hartley Barefoot RN 07/30/17 0139 JDW    Culture (A)  Final    ESCHERICHIA COLI KLEBSIELLA PNEUMONIAE Confirmed Extended Spectrum Beta-Lactamase Producer (ESBL).  In bloodstream infections from ESBL organisms, carbapenems are preferred over piperacillin/tazobactam. They are shown to have a lower risk of mortality. Performed at [facility], [address], [place], [place] [address]    Report Status 08/01/2017 FINAL  Final   Organism ID, Bacteria ESCHERICHIA COLI  Final   Organism ID, Bacteria KLEBSIELLA PNEUMONIAE  Final      Susceptibility   Escherichia coli - MIC*    AMPICILLIN >=32 RESISTANT Resistant     CEFAZOLIN <=4 SENSITIVE Sensitive     CEFEPIME <=1 SENSITIVE  Sensitive     CEFTAZIDIME <=1 SENSITIVE Sensitive     CEFTRIAXONE <=1 SENSITIVE Sensitive     CIPROFLOXACIN <=0.25 SENSITIVE Sensitive     GENTAMICIN <=1 SENSITIVE Sensitive     IMIPENEM <=0.25 SENSITIVE Sensitive     TRIMETH/SULFA >=320 RESISTANT Resistant     AMPICILLIN/SULBACTAM 16 INTERMEDIATE Intermediate     PIP/TAZO <=4 SENSITIVE Sensitive     Extended ESBL NEGATIVE Sensitive     *  ESCHERICHIA COLI   Klebsiella pneumoniae - MIC*    AMPICILLIN >=32 RESISTANT Resistant     CEFAZOLIN >=64 RESISTANT Resistant     CEFEPIME 32 RESISTANT Resistant     CEFTAZIDIME RESISTANT Resistant     CEFTRIAXONE >=64 RESISTANT Resistant     CIPROFLOXACIN 2 INTERMEDIATE Intermediate     GENTAMICIN <=1 SENSITIVE Sensitive     IMIPENEM <=0.25 SENSITIVE Sensitive     TRIMETH/SULFA >=320 RESISTANT Resistant     AMPICILLIN/SULBACTAM >=32 RESISTANT Resistant     PIP/TAZO 32 INTERMEDIATE Intermediate     Extended ESBL POSITIVE Resistant     * KLEBSIELLA PNEUMONIAE  Culture, blood (routine x 2)     Status: Abnormal   Collection Time: 07/29/17 12:37 PM  Result Value Ref Range Status   Specimen Description BLOOD LEFT ANTECUBITAL  Final   Special Requests   Final    BOTTLES DRAWN AEROBIC ONLY Blood Culture results may not be optimal due to an inadequate volume of blood received in culture bottles   Culture  Setup Time   Final    AEROBIC BOTTLE ONLY GRAM NEGATIVE RODS CRITICAL VALUE NOTED.  VALUE IS CONSISTENT WITH PREVIOUSLY REPORTED AND CALLED VALUE.    Culture (A)  Final    KLEBSIELLA PNEUMONIAE SUSCEPTIBILITIES PERFORMED ON PREVIOUS CULTURE WITHIN THE LAST 5 DAYS. Performed at [facility], [address], [place], [place] [address]    Report Status 08/01/2017 FINAL  Final  Blood Culture ID Panel (Reflexed)     Status: Abnormal   Collection Time: 07/29/17 12:37 PM  Result Value Ref Range Status   Enterococcus species NOT DETECTED NOT DETECTED Final   Listeria monocytogenes NOT DETECTED  NOT DETECTED Final   Staphylococcus species NOT DETECTED NOT DETECTED Final   Staphylococcus aureus NOT DETECTED NOT DETECTED Final   Streptococcus species NOT DETECTED NOT DETECTED Final   Streptococcus agalactiae NOT DETECTED NOT DETECTED Final   Streptococcus pneumoniae NOT DETECTED NOT DETECTED Final   Streptococcus pyogenes NOT DETECTED NOT DETECTED Final   Acinetobacter baumannii NOT DETECTED NOT DETECTED Final   Enterobacteriaceae species DETECTED (A) NOT DETECTED Final    Comment: CRITICAL RESULT CALLED TO, READ BACK BY AND VERIFIED WITH: Hartley Barefoot RN 07/30/17 0136 JDW    Enterobacter cloacae complex NOT DETECTED NOT DETECTED Final   Escherichia coli DETECTED (A) NOT DETECTED Final    Comment: CRITICAL RESULT CALLED TO, READ BACK BY AND VERIFIED WITH: Hartley Barefoot RN 07/30/17 0136 JDW    Klebsiella oxytoca NOT DETECTED NOT DETECTED Final   Klebsiella pneumoniae DETECTED (A) NOT DETECTED Final    Comment: CRITICAL RESULT CALLED TO, READ BACK BY AND VERIFIED WITH: Hartley Barefoot RN 07/30/17 0136 JDW    Proteus species NOT DETECTED NOT DETECTED Final   Serratia marcescens NOT DETECTED NOT DETECTED Final   Carbapenem resistance NOT DETECTED NOT DETECTED Final   Haemophilus influenzae NOT DETECTED NOT DETECTED Final   Neisseria meningitidis NOT DETECTED NOT DETECTED Final   Pseudomonas aeruginosa NOT DETECTED NOT DETECTED Final   Candida albicans NOT DETECTED NOT DETECTED Final   Candida glabrata NOT DETECTED NOT DETECTED Final   Candida krusei NOT DETECTED NOT DETECTED Final   Candida parapsilosis NOT DETECTED NOT DETECTED Final   Candida tropicalis NOT DETECTED NOT DETECTED Final    Comment: Performed at [facility], [address] [place], [place] [address]    Coagulation Studies: No results for input(s): LABPROT, INR in the last 72 hours.  Urinalysis:  No results for input(s): COLORURINE, LABSPEC, PHURINE, GLUCOSEU, HGBUR, BILIRUBINUR, KETONESUR, PROTEINUR, UROBILINOGEN,  NITRITE, LEUKOCYTESUR in the last 72 hours.  Invalid input(s): APPERANCEUR    Imaging: No results found.   Medications:     lidocaine (PF)  Assessment/ Plan:  62 y.o. 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   1.  Severe acute renal failure. 2.  Hyponatremia, Na 129 3.  Acute respiratory failure, worsening, back on ventilator. 4.  Anemia unspecified, Hgb down to 7.7 5.  Severe protein calorie malnutrition with severe emaciation, albumin 2.0 6.  Hyperkalemia, resolved 7.  Multiorganism sepsis.   Plan: Patient has gram-negative sepsis and is currently on pressors.  He did undergo hemodialysis yesterday.  We recommend removal of temporary dialysis catheter and replacement later this week.  This was discussed in depth with nursing.  Hemoglobin also appears to be drifting down and is currently 7.7.  Consider transfusion for hemoglobin of 7 or less.  Otherwise continue supportive care with pressors, parenteral nutrition, and antibiotics.  Overall guarded prognosis.   LOS: 0 Melvin Kelley 6/17/201910:32 AM

## 2017-08-04 LAB — HEPATITIS B SURFACE ANTIGEN: Hepatitis B Surface Ag: NEGATIVE

## 2017-08-04 MED FILL — Medication: Qty: 1 | Status: AC

## 2017-08-05 NOTE — Progress Notes (Signed)
[facility]  ROUNDING NOTE   Subjective:  Patient remains critically ill. Temporary dialysis catheter was removed.  Still on the ventilator.    Objective:  Vital signs in last 24 hours:  Temperature 98.4 pulse 79 respirations 30 blood pressure 95/57  Physical Exam: General: Critically ill appaering  Head: Severe temporal wasting, NG in place  Eyes: Anicteric  Neck: Tracheostomy in place  Lungs:  Bilateral rhonchi, vent assisted  Heart: S1S2 no rubs  Abdomen:  Soft, nontender, bowel sounds present  Extremities: No peripheral edema.  Neurologic: Awake, not following commands  Skin: No rash  Access: Right subclavain temporary dialysis catheter    Basic Metabolic Panel: Recent Labs  Lab 07/30/17 0500 07/31/17 0644 08/01/17 0633 08/03/17 0548  NA 126*  --  128*  --   K 4.4  --  5.1  --   CL 91*  --  97*  --   CO2 24  --  23  --   GLUCOSE 232*  --  242*  --   BUN 72*  --  60*  --   CREATININE 2.53*  --  2.06*  --   CALCIUM 8.6*  --  8.2*  --   MG  --  2.0  --  1.7  PHOS 4.6  --  4.7*  --     Liver Function Tests: Recent Labs  Lab 07/30/17 0500 08/01/17 0633  ALBUMIN 2.0* 2.0*   No results for input(s): LIPASE, AMYLASE in the last 168 hours. No results for input(s): AMMONIA in the last 168 hours.  CBC: Recent Labs  Lab 07/30/17 0606 08/01/17 0633  WBC 33.8* 11.3*  HGB 8.7* 7.7*  HCT 26.8* 24.6*  MCV 85.4 85.4  PLT 107* 45*    Cardiac Enzymes: No results for input(s): CKTOTAL, CKMB, CKMBINDEX, TROPONINI in the last 168 hours.  BNP: Invalid input(s): POCBNP  CBG: No results for input(s): GLUCAP in the last 168 hours.  Microbiology: Results for orders placed or performed during the hospital encounter of 06/01/2017  Culture, Urine     Status: Abnormal   Collection Time: 07/29/17 12:20 PM  Result Value Ref Range Status   Specimen Description URINE, RANDOM  Final   Special Requests NONE  Final   Culture (A)  Final    >=100,000  COLONIES/mL ESCHERICHIA COLI >=100,000 COLONIES/mL KLEBSIELLA PNEUMONIAE Confirmed Extended Spectrum Beta-Lactamase Producer (ESBL).  In bloodstream infections from ESBL organisms, carbapenems are preferred over piperacillin/tazobactam. They are shown to have a lower risk of mortality. Performed at [facility], [address] [place], [place] [address]    Report Status 08/01/2017 FINAL  Final   Organism ID, Bacteria ESCHERICHIA COLI (A)  Final   Organism ID, Bacteria KLEBSIELLA PNEUMONIAE (A)  Final      Susceptibility   Escherichia coli - MIC*    AMPICILLIN >=32 RESISTANT Resistant     CEFAZOLIN <=4 SENSITIVE Sensitive     CEFTRIAXONE <=1 SENSITIVE Sensitive     CIPROFLOXACIN <=0.25 SENSITIVE Sensitive     GENTAMICIN <=1 SENSITIVE Sensitive     IMIPENEM <=0.25 SENSITIVE Sensitive     NITROFURANTOIN <=16 SENSITIVE Sensitive     TRIMETH/SULFA >=320 RESISTANT Resistant     AMPICILLIN/SULBACTAM 16 INTERMEDIATE Intermediate     PIP/TAZO <=4 SENSITIVE Sensitive     Extended ESBL NEGATIVE Sensitive     * >=100,000 COLONIES/mL ESCHERICHIA COLI   Klebsiella pneumoniae - MIC*    AMPICILLIN >=32 RESISTANT Resistant     CEFAZOLIN >=64  RESISTANT Resistant     CEFTRIAXONE >=64 RESISTANT Resistant     CIPROFLOXACIN 2 INTERMEDIATE Intermediate     GENTAMICIN <=1 SENSITIVE Sensitive     IMIPENEM <=0.25 SENSITIVE Sensitive     NITROFURANTOIN 32 SENSITIVE Sensitive     TRIMETH/SULFA >=320 RESISTANT Resistant     AMPICILLIN/SULBACTAM >=32 RESISTANT Resistant     PIP/TAZO 32 INTERMEDIATE Intermediate     Extended ESBL POSITIVE Resistant     * >=100,000 COLONIES/mL KLEBSIELLA PNEUMONIAE  C difficile quick scan w PCR reflex     Status: Abnormal   Collection Time: 07/29/17 12:24 PM  Result Value Ref Range Status   C Diff antigen POSITIVE (A) NEGATIVE Final   C Diff toxin NEGATIVE NEGATIVE Final   C Diff interpretation Results are indeterminate. See PCR results.  Final    Comment: Performed  at [facility] Lab, [address] [place], [place] [address]  C. Diff by PCR, Reflexed     Status: None   Collection Time: 07/29/17 12:24 PM  Result Value Ref Range Status   Toxigenic C. Difficile by PCR NEGATIVE NEGATIVE Final    Comment: Patient is colonized with non toxigenic C. difficile. May not need treatment unless significant symptoms are present. Performed at [facility] Lab, [address] [place], [place] [address]   Culture, blood (routine x 2)     Status: Abnormal   Collection Time: 07/29/17 12:37 PM  Result Value Ref Range Status   Specimen Description BLOOD LEFT ANTECUBITAL  Final   Special Requests   Final    BOTTLES DRAWN AEROBIC ONLY Blood Culture results may not be optimal due to an inadequate volume of blood received in culture bottles   Culture  Setup Time   Final    AEROBIC BOTTLE ONLY GRAM NEGATIVE RODS CRITICAL RESULT CALLED TO, READ BACK BY AND VERIFIED WITH: Hartley Barefoot RN 07/30/17 0139 JDW    Culture (A)  Final    ESCHERICHIA COLI KLEBSIELLA PNEUMONIAE Confirmed Extended Spectrum Beta-Lactamase Producer (ESBL).  In bloodstream infections from ESBL organisms, carbapenems are preferred over piperacillin/tazobactam. They are shown to have a lower risk of mortality. Performed at [facility] Lab, [address], [place], [place] [address]    Report Status 08/01/2017 FINAL  Final   Organism ID, Bacteria ESCHERICHIA COLI  Final   Organism ID, Bacteria KLEBSIELLA PNEUMONIAE  Final      Susceptibility   Escherichia coli - MIC*    AMPICILLIN >=32 RESISTANT Resistant     CEFAZOLIN <=4 SENSITIVE Sensitive     CEFEPIME <=1 SENSITIVE Sensitive     CEFTAZIDIME <=1 SENSITIVE Sensitive     CEFTRIAXONE <=1 SENSITIVE Sensitive     CIPROFLOXACIN <=0.25 SENSITIVE Sensitive     GENTAMICIN <=1 SENSITIVE Sensitive     IMIPENEM <=0.25 SENSITIVE Sensitive     TRIMETH/SULFA >=320 RESISTANT Resistant     AMPICILLIN/SULBACTAM 16 INTERMEDIATE Intermediate     PIP/TAZO  <=4 SENSITIVE Sensitive     Extended ESBL NEGATIVE Sensitive     * ESCHERICHIA COLI   Klebsiella pneumoniae - MIC*    AMPICILLIN >=32 RESISTANT Resistant     CEFAZOLIN >=64 RESISTANT Resistant     CEFEPIME 32 RESISTANT Resistant     CEFTAZIDIME RESISTANT Resistant     CEFTRIAXONE >=64 RESISTANT Resistant     CIPROFLOXACIN 2 INTERMEDIATE Intermediate     GENTAMICIN <=1 SENSITIVE Sensitive     IMIPENEM <=0.25 SENSITIVE Sensitive     TRIMETH/SULFA >=320 RESISTANT  Resistant     AMPICILLIN/SULBACTAM >=32 RESISTANT Resistant     PIP/TAZO 32 INTERMEDIATE Intermediate     Extended ESBL POSITIVE Resistant     * KLEBSIELLA PNEUMONIAE  Culture, blood (routine x 2)     Status: Abnormal   Collection Time: 07/29/17 12:37 PM  Result Value Ref Range Status   Specimen Description BLOOD LEFT ANTECUBITAL  Final   Special Requests   Final    BOTTLES DRAWN AEROBIC ONLY Blood Culture results may not be optimal due to an inadequate volume of blood received in culture bottles   Culture  Setup Time   Final    AEROBIC BOTTLE ONLY GRAM NEGATIVE RODS CRITICAL VALUE NOTED.  VALUE IS CONSISTENT WITH PREVIOUSLY REPORTED AND CALLED VALUE.    Culture (A)  Final    KLEBSIELLA PNEUMONIAE SUSCEPTIBILITIES PERFORMED ON PREVIOUS CULTURE WITHIN THE LAST 5 DAYS. Performed at [facility] Lab, [address], [place], [place] [address]    Report Status 08/01/2017 FINAL  Final  Blood Culture ID Panel (Reflexed)     Status: Abnormal   Collection Time: 07/29/17 12:37 PM  Result Value Ref Range Status   Enterococcus species NOT DETECTED NOT DETECTED Final   Listeria monocytogenes NOT DETECTED NOT DETECTED Final   Staphylococcus species NOT DETECTED NOT DETECTED Final   Staphylococcus aureus NOT DETECTED NOT DETECTED Final   Streptococcus species NOT DETECTED NOT DETECTED Final   Streptococcus agalactiae NOT DETECTED NOT DETECTED Final   Streptococcus pneumoniae NOT DETECTED NOT DETECTED Final   Streptococcus  pyogenes NOT DETECTED NOT DETECTED Final   Acinetobacter baumannii NOT DETECTED NOT DETECTED Final   Enterobacteriaceae species DETECTED (A) NOT DETECTED Final    Comment: CRITICAL RESULT CALLED TO, READ BACK BY AND VERIFIED WITH: Hartley Barefoot RN 07/30/17 0136 JDW    Enterobacter cloacae complex NOT DETECTED NOT DETECTED Final   Escherichia coli DETECTED (A) NOT DETECTED Final    Comment: CRITICAL RESULT CALLED TO, READ BACK BY AND VERIFIED WITH: Hartley Barefoot RN 07/30/17 0136 JDW    Klebsiella oxytoca NOT DETECTED NOT DETECTED Final   Klebsiella pneumoniae DETECTED (A) NOT DETECTED Final    Comment: CRITICAL RESULT CALLED TO, READ BACK BY AND VERIFIED WITH: Hartley Barefoot RN 07/30/17 0136 JDW    Proteus species NOT DETECTED NOT DETECTED Final   Serratia marcescens NOT DETECTED NOT DETECTED Final   Carbapenem resistance NOT DETECTED NOT DETECTED Final   Haemophilus influenzae NOT DETECTED NOT DETECTED Final   Neisseria meningitidis NOT DETECTED NOT DETECTED Final   Pseudomonas aeruginosa NOT DETECTED NOT DETECTED Final   Candida albicans NOT DETECTED NOT DETECTED Final   Candida glabrata NOT DETECTED NOT DETECTED Final   Candida krusei NOT DETECTED NOT DETECTED Final   Candida parapsilosis NOT DETECTED NOT DETECTED Final   Candida tropicalis NOT DETECTED NOT DETECTED Final    Comment: Performed at [facility] Lab, [address] [place], [place] [address]    Coagulation Studies: No results for input(s): LABPROT, INR in the last 72 hours.  Urinalysis: No results for input(s): COLORURINE, LABSPEC, PHURINE, GLUCOSEU, HGBUR, BILIRUBINUR, KETONESUR, PROTEINUR, UROBILINOGEN, NITRITE, LEUKOCYTESUR in the last 72 hours.  Invalid input(s): APPERANCEUR    Imaging: No results found.   Medications:     lidocaine (PF)  Assessment/ Plan:  62 y.o. 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   1.  Severe acute renal failure. 2.  Hyponatremia, Na 128 3.  Acute respiratory failure, worsening, back on ventilator. 4.  Anemia unspecified, Hgb down to 7.7 5.  Severe protein calorie malnutrition with severe emaciation, albumin 2.0 6.  Hyperkalemia, K 5.1.  7.  Multiorganism sepsis.   Plan: Temporary dialysis catheter was removed.  We plan to replace the dialysis catheter tomorrow and we will plan for dialysis on both Thursday and Friday.  Serum sodium at last check was 128 and has been chronically low.  Acute respiratory failure persists and patient remains on the ventilator.  Also recommend continued monitoring of CBC as this has drifted down as well.  Patient remains on TPN for severe malnutrition.  Further plan as patient progresses.   LOS: 0 Omer Monter 6/19/20194:02 PM

## 2017-08-06 LAB — RENAL FUNCTION PANEL
ANION GAP: 6 (ref 5–15)
Albumin: 1.9 g/dL — ABNORMAL LOW (ref 3.5–5.0)
BUN: 58 mg/dL — ABNORMAL HIGH (ref 6–20)
CALCIUM: 8.4 mg/dL — AB (ref 8.9–10.3)
CHLORIDE: 101 mmol/L (ref 101–111)
CO2: 23 mmol/L (ref 22–32)
Creatinine, Ser: 1.48 mg/dL — ABNORMAL HIGH (ref 0.61–1.24)
GFR calc non Af Amer: 50 mL/min — ABNORMAL LOW (ref >60)
GFR, EST AFRICAN AMERICAN: 58 mL/min — AB (ref >60)
GLUCOSE: 237 mg/dL — AB (ref 65–99)
POTASSIUM: 4.8 mmol/L (ref 3.5–5.1)
Phosphorus: 4.6 mg/dL (ref 2.5–4.6)
Sodium: 130 mmol/L — ABNORMAL LOW (ref 135–145)

## 2017-08-06 LAB — CBC
HEMATOCRIT: 24.7 % — AB (ref 39.0–52.0)
HEMOGLOBIN: 7.7 g/dL — AB (ref 13.0–17.0)
MCH: 27.3 pg (ref 26.0–34.0)
MCHC: 31.2 g/dL (ref 30.0–36.0)
MCV: 87.6 fL (ref 78.0–100.0)
Platelets: 41 10*3/uL — ABNORMAL LOW (ref 150–400)
RBC: 2.82 MIL/uL — ABNORMAL LOW (ref 4.22–5.81)
RDW: 19.8 % — ABNORMAL HIGH (ref 11.5–15.5)
WBC: 11.9 10*3/uL — ABNORMAL HIGH (ref 4.0–10.5)

## 2017-08-06 LAB — MAGNESIUM: MAGNESIUM: 1.8 mg/dL (ref 1.7–2.4)

## 2017-08-07 ENCOUNTER — Encounter (HOSPITAL_COMMUNITY): Payer: Self-pay | Admitting: Interventional Radiology

## 2017-08-07 ENCOUNTER — Other Ambulatory Visit (HOSPITAL_COMMUNITY): Payer: Self-pay

## 2017-08-07 HISTORY — PX: IR FLUORO GUIDE CV LINE LEFT: IMG2282

## 2017-08-07 HISTORY — PX: IR US GUIDE VASC ACCESS LEFT: IMG2389

## 2017-08-07 LAB — RENAL FUNCTION PANEL
Albumin: 1.8 g/dL — ABNORMAL LOW (ref 3.5–5.0)
Anion gap: 6 (ref 5–15)
BUN: 63 mg/dL — ABNORMAL HIGH (ref 6–20)
CALCIUM: 8.3 mg/dL — AB (ref 8.9–10.3)
CO2: 22 mmol/L (ref 22–32)
CREATININE: 1.48 mg/dL — AB (ref 0.61–1.24)
Chloride: 101 mmol/L (ref 101–111)
GFR, EST AFRICAN AMERICAN: 58 mL/min — AB (ref >60)
GFR, EST NON AFRICAN AMERICAN: 50 mL/min — AB (ref >60)
Glucose, Bld: 184 mg/dL — ABNORMAL HIGH (ref 65–99)
PHOSPHORUS: 5 mg/dL — AB (ref 2.5–4.6)
Potassium: 5.2 mmol/L — ABNORMAL HIGH (ref 3.5–5.1)
SODIUM: 129 mmol/L — AB (ref 135–145)

## 2017-08-07 LAB — HEPARIN INDUCED PLATELET AB (HIT ANTIBODY): HEPARIN INDUCED PLT AB: 0.333 {OD_unit} (ref 0.000–0.400)

## 2017-08-07 LAB — CBC
HCT: 28.2 % — ABNORMAL LOW (ref 39.0–52.0)
Hemoglobin: 8.6 g/dL — ABNORMAL LOW (ref 13.0–17.0)
MCH: 27.1 pg (ref 26.0–34.0)
MCHC: 30.5 g/dL (ref 30.0–36.0)
MCV: 89 fL (ref 78.0–100.0)
Platelets: 180 10*3/uL (ref 150–400)
RBC: 3.17 MIL/uL — AB (ref 4.22–5.81)
RDW: 20.6 % — ABNORMAL HIGH (ref 11.5–15.5)
WBC: 18.6 10*3/uL — AB (ref 4.0–10.5)

## 2017-08-07 MED ORDER — LIDOCAINE HCL 1 % IJ SOLN
INTRAMUSCULAR | Status: AC
  Filled 2017-08-07: qty 20

## 2017-08-07 MED ORDER — HEPARIN SODIUM (PORCINE) 1000 UNIT/ML IJ SOLN
INTRAMUSCULAR | Status: AC
  Filled 2017-08-07: qty 1

## 2017-08-07 NOTE — Procedures (Signed)
Pre-procedure Diagnosis: ESRD Post-procedure Diagnosis: Same  Successful placement of non-tunneled HD catheter with tips terminating within the superior aspect of the right atrium.    Complications: None Immediate  EBL: Minimal   The catheter is ready for immediate use.   Jay Miaya Lafontant, MD Pager #: [phone]   

## 2017-08-07 NOTE — Progress Notes (Signed)
[facility]  ROUNDING NOTE   Subjective:  Patient going down for PermCath placement this afternoon. Thereafter we will plan for reinitiation of dialysis. Still on the ventilator.  Objective:  Vital signs in last 24 hours:  Temperature 97 pulse 90 respirations 26 blood pressure 124/74  Physical Exam: General: Critically ill appearing  Head: Severe temporal wasting, NG in place  Eyes: Anicteric  Neck: Tracheostomy in place  Lungs:  Bilateral rhonchi, vent assisted  Heart: S1S2 no rubs  Abdomen:  Soft, nontender, bowel sounds present  Extremities: No peripheral edema.  Neurologic: Awake, not following commands  Skin: No rash  Access: Right subclavain temporary dialysis catheter    Basic Metabolic Panel: Recent Labs  Lab 08/01/17 0633 08/03/17 0548 08/06/17 0513 08/07/17 0728  NA 128*  --  130* 129*  K 5.1  --  4.8 5.2*  CL 97*  --  101 101  CO2 23  --  23 22  GLUCOSE 242*  --  237* 184*  BUN 60*  --  58* 63*  CREATININE 2.06*  --  1.48* 1.48*  CALCIUM 8.2*  --  8.4* 8.3*  MG  --  1.7 1.8  --   PHOS 4.7*  --  4.6 5.0*    Liver Function Tests: Recent Labs  Lab 08/01/17 0633 08/06/17 0513 08/07/17 0728  ALBUMIN 2.0* 1.9* 1.8*   No results for input(s): LIPASE, AMYLASE in the last 168 hours. No results for input(s): AMMONIA in the last 168 hours.  CBC: Recent Labs  Lab 08/01/17 0633 08/06/17 0513 08/07/17 0913  WBC 11.3* 11.9* 18.6*  HGB 7.7* 7.7* 8.6*  HCT 24.6* 24.7* 28.2*  MCV 85.4 87.6 89.0  PLT 45* 41* 180    Cardiac Enzymes: No results for input(s): CKTOTAL, CKMB, CKMBINDEX, TROPONINI in the last 168 hours.  BNP: Invalid input(s): POCBNP  CBG: No results for input(s): GLUCAP in the last 168 hours.  Microbiology: Results for orders placed or performed during the hospital encounter of 2017/06/06  Culture, Urine     Status: Abnormal   Collection Time: 07/29/17 12:20 PM  Result Value Ref Range Status   Specimen Description  URINE, RANDOM  Final   Special Requests NONE  Final   Culture (A)  Final    >=100,000 COLONIES/mL ESCHERICHIA COLI >=100,000 COLONIES/mL KLEBSIELLA PNEUMONIAE Confirmed Extended Spectrum Beta-Lactamase Producer (ESBL).  In bloodstream infections from ESBL organisms, carbapenems are preferred over piperacillin/tazobactam. They are shown to have a lower risk of mortality. Performed at [facility], [address] [place], [place] [address]    Report Status 08/01/2017 FINAL  Final   Organism ID, Bacteria ESCHERICHIA COLI (A)  Final   Organism ID, Bacteria KLEBSIELLA PNEUMONIAE (A)  Final      Susceptibility   Escherichia coli - MIC*    AMPICILLIN >=32 RESISTANT Resistant     CEFAZOLIN <=4 SENSITIVE Sensitive     CEFTRIAXONE <=1 SENSITIVE Sensitive     CIPROFLOXACIN <=0.25 SENSITIVE Sensitive     GENTAMICIN <=1 SENSITIVE Sensitive     IMIPENEM <=0.25 SENSITIVE Sensitive     NITROFURANTOIN <=16 SENSITIVE Sensitive     TRIMETH/SULFA >=320 RESISTANT Resistant     AMPICILLIN/SULBACTAM 16 INTERMEDIATE Intermediate     PIP/TAZO <=4 SENSITIVE Sensitive     Extended ESBL NEGATIVE Sensitive     * >=100,000 COLONIES/mL ESCHERICHIA COLI   Klebsiella pneumoniae - MIC*    AMPICILLIN >=32 RESISTANT Resistant     CEFAZOLIN >=64 RESISTANT Resistant  CEFTRIAXONE >=64 RESISTANT Resistant     CIPROFLOXACIN 2 INTERMEDIATE Intermediate     GENTAMICIN <=1 SENSITIVE Sensitive     IMIPENEM <=0.25 SENSITIVE Sensitive     NITROFURANTOIN 32 SENSITIVE Sensitive     TRIMETH/SULFA >=320 RESISTANT Resistant     AMPICILLIN/SULBACTAM >=32 RESISTANT Resistant     PIP/TAZO 32 INTERMEDIATE Intermediate     Extended ESBL POSITIVE Resistant     * >=100,000 COLONIES/mL KLEBSIELLA PNEUMONIAE  C difficile quick scan w PCR reflex     Status: Abnormal   Collection Time: 07/29/17 12:24 PM  Result Value Ref Range Status   C Diff antigen POSITIVE (A) NEGATIVE Final   C Diff toxin NEGATIVE NEGATIVE Final   C Diff  interpretation Results are indeterminate. See PCR results.  Final    Comment: Performed at [facility] Lab, [address], [place], [place] [address]  C. Diff by PCR, Reflexed     Status: None   Collection Time: 07/29/17 12:24 PM  Result Value Ref Range Status   Toxigenic C. Difficile by PCR NEGATIVE NEGATIVE Final    Comment: Patient is colonized with non toxigenic C. difficile. May not need treatment unless significant symptoms are present. Performed at [facility], [address], [place], [place] [address]   Culture, blood (routine x 2)     Status: Abnormal   Collection Time: 07/29/17 12:37 PM  Result Value Ref Range Status   Specimen Description BLOOD LEFT ANTECUBITAL  Final   Special Requests   Final    BOTTLES DRAWN AEROBIC ONLY Blood Culture results may not be optimal due to an inadequate volume of blood received in culture bottles   Culture  Setup Time   Final    AEROBIC BOTTLE ONLY GRAM NEGATIVE RODS CRITICAL RESULT CALLED TO, READ BACK BY AND VERIFIED WITH: Hartley Barefoot RN 07/30/17 0139 JDW    Culture (A)  Final    ESCHERICHIA COLI KLEBSIELLA PNEUMONIAE Confirmed Extended Spectrum Beta-Lactamase Producer (ESBL).  In bloodstream infections from ESBL organisms, carbapenems are preferred over piperacillin/tazobactam. They are shown to have a lower risk of mortality. Performed at [facility], [address], [place], [place] [address]    Report Status 08/01/2017 FINAL  Final   Organism ID, Bacteria ESCHERICHIA COLI  Final   Organism ID, Bacteria KLEBSIELLA PNEUMONIAE  Final      Susceptibility   Escherichia coli - MIC*    AMPICILLIN >=32 RESISTANT Resistant     CEFAZOLIN <=4 SENSITIVE Sensitive     CEFEPIME <=1 SENSITIVE Sensitive     CEFTAZIDIME <=1 SENSITIVE Sensitive     CEFTRIAXONE <=1 SENSITIVE Sensitive     CIPROFLOXACIN <=0.25 SENSITIVE Sensitive     GENTAMICIN <=1 SENSITIVE Sensitive     IMIPENEM <=0.25 SENSITIVE Sensitive     TRIMETH/SULFA >=320  RESISTANT Resistant     AMPICILLIN/SULBACTAM 16 INTERMEDIATE Intermediate     PIP/TAZO <=4 SENSITIVE Sensitive     Extended ESBL NEGATIVE Sensitive     * ESCHERICHIA COLI   Klebsiella pneumoniae - MIC*    AMPICILLIN >=32 RESISTANT Resistant     CEFAZOLIN >=64 RESISTANT Resistant     CEFEPIME 32 RESISTANT Resistant     CEFTAZIDIME RESISTANT Resistant     CEFTRIAXONE >=64 RESISTANT Resistant     CIPROFLOXACIN 2 INTERMEDIATE Intermediate     GENTAMICIN <=1 SENSITIVE Sensitive     IMIPENEM <=0.25 SENSITIVE Sensitive     TRIMETH/SULFA >=320 RESISTANT Resistant     AMPICILLIN/SULBACTAM >=  32 RESISTANT Resistant     PIP/TAZO 32 INTERMEDIATE Intermediate     Extended ESBL POSITIVE Resistant     * KLEBSIELLA PNEUMONIAE  Culture, blood (routine x 2)     Status: Abnormal   Collection Time: 07/29/17 12:37 PM  Result Value Ref Range Status   Specimen Description BLOOD LEFT ANTECUBITAL  Final   Special Requests   Final    BOTTLES DRAWN AEROBIC ONLY Blood Culture results may not be optimal due to an inadequate volume of blood received in culture bottles   Culture  Setup Time   Final    AEROBIC BOTTLE ONLY GRAM NEGATIVE RODS CRITICAL VALUE NOTED.  VALUE IS CONSISTENT WITH PREVIOUSLY REPORTED AND CALLED VALUE.    Culture (A)  Final    KLEBSIELLA PNEUMONIAE SUSCEPTIBILITIES PERFORMED ON PREVIOUS CULTURE WITHIN THE LAST 5 DAYS. Performed at [facility] Lab, [address], [place], [place] [address]    Report Status 08/01/2017 FINAL  Final  Blood Culture ID Panel (Reflexed)     Status: Abnormal   Collection Time: 07/29/17 12:37 PM  Result Value Ref Range Status   Enterococcus species NOT DETECTED NOT DETECTED Final   Listeria monocytogenes NOT DETECTED NOT DETECTED Final   Staphylococcus species NOT DETECTED NOT DETECTED Final   Staphylococcus aureus NOT DETECTED NOT DETECTED Final   Streptococcus species NOT DETECTED NOT DETECTED Final   Streptococcus agalactiae NOT DETECTED NOT DETECTED  Final   Streptococcus pneumoniae NOT DETECTED NOT DETECTED Final   Streptococcus pyogenes NOT DETECTED NOT DETECTED Final   Acinetobacter baumannii NOT DETECTED NOT DETECTED Final   Enterobacteriaceae species DETECTED (A) NOT DETECTED Final    Comment: CRITICAL RESULT CALLED TO, READ BACK BY AND VERIFIED WITH: Hartley Barefoot RN 07/30/17 0136 JDW    Enterobacter cloacae complex NOT DETECTED NOT DETECTED Final   Escherichia coli DETECTED (A) NOT DETECTED Final    Comment: CRITICAL RESULT CALLED TO, READ BACK BY AND VERIFIED WITH: Hartley Barefoot RN 07/30/17 0136 JDW    Klebsiella oxytoca NOT DETECTED NOT DETECTED Final   Klebsiella pneumoniae DETECTED (A) NOT DETECTED Final    Comment: CRITICAL RESULT CALLED TO, READ BACK BY AND VERIFIED WITH: Hartley Barefoot RN 07/30/17 0136 JDW    Proteus species NOT DETECTED NOT DETECTED Final   Serratia marcescens NOT DETECTED NOT DETECTED Final   Carbapenem resistance NOT DETECTED NOT DETECTED Final   Haemophilus influenzae NOT DETECTED NOT DETECTED Final   Neisseria meningitidis NOT DETECTED NOT DETECTED Final   Pseudomonas aeruginosa NOT DETECTED NOT DETECTED Final   Candida albicans NOT DETECTED NOT DETECTED Final   Candida glabrata NOT DETECTED NOT DETECTED Final   Candida krusei NOT DETECTED NOT DETECTED Final   Candida parapsilosis NOT DETECTED NOT DETECTED Final   Candida tropicalis NOT DETECTED NOT DETECTED Final    Comment: Performed at [facility] Lab, [address] [place], [place] [address]    Coagulation Studies: No results for input(s): LABPROT, INR in the last 72 hours.  Urinalysis: No results for input(s): COLORURINE, LABSPEC, PHURINE, GLUCOSEU, HGBUR, BILIRUBINUR, KETONESUR, PROTEINUR, UROBILINOGEN, NITRITE, LEUKOCYTESUR in the last 72 hours.  Invalid input(s): APPERANCEUR    Imaging: Dg Abd Portable 1v  Result Date: 08/07/2017 CLINICAL DATA:  Decreased bowel sounds EXAM: PORTABLE ABDOMEN - 1 VIEW COMPARISON:  07/19/2017 FINDINGS:  There is a enteric tube with the tip projecting over the antrum of the stomach. There is severe gaseous distension of the colon and to a much lesser extent small  bowel. Evaluation for free air is limited secondary to supine position. There are no pathologic calcifications along the expected course of the ureters. The osseous structures are unremarkable. IMPRESSION: 1. Enteric tube with the tip projecting over the antrum of the stomach. 2. Severe gaseous distension of the colon and to lesser extent small bowel. Appearance is concerning for an ileus. Electronically Signed   By: Elige Ko   On: 08/07/2017 10:21     Medications:    . heparin      . lidocaine       lidocaine (PF)  Assessment/ Plan:  62 y.o. 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   1.  Severe acute renal failure. 2.  Hyponatremia, Na 129 3.  Acute respiratory failure, worsening, back on ventilator. 4.  Anemia unspecified, Hgb 8.6 5.  Severe protein calorie malnutrition with severe emaciation, albumin 2.0 6.  Hyperkalemia, K 5.2 7.  Multiorganism sepsis.   Plan: Patient going down for temporary dialysis catheter placement today.  Potassium has risen to 5.2.  We will plan for dialysis after temporary dialysis catheter has been placed.  Hemoglobin has come up a bit to 8.6.  He still has a significant WBC count 18.6.  Antibiotic management as per hospitalist.   LOS: 0 Dayjah Selman 6/21/20194:43 PM

## 2017-08-08 ENCOUNTER — Other Ambulatory Visit (HOSPITAL_COMMUNITY): Payer: Self-pay

## 2017-08-09 LAB — MAGNESIUM: MAGNESIUM: 1.8 mg/dL (ref 1.7–2.4)

## 2017-08-10 LAB — RENAL FUNCTION PANEL
Albumin: 1.7 g/dL — ABNORMAL LOW (ref 3.5–5.0)
Anion gap: 6 (ref 5–15)
BUN: 52 mg/dL — ABNORMAL HIGH (ref 6–20)
CHLORIDE: 97 mmol/L — AB (ref 101–111)
CO2: 27 mmol/L (ref 22–32)
CREATININE: 1.34 mg/dL — AB (ref 0.61–1.24)
Calcium: 8.4 mg/dL — ABNORMAL LOW (ref 8.9–10.3)
GFR, EST NON AFRICAN AMERICAN: 56 mL/min — AB (ref >60)
Glucose, Bld: 127 mg/dL — ABNORMAL HIGH (ref 65–99)
Phosphorus: 4.5 mg/dL (ref 2.5–4.6)
Potassium: 4.9 mmol/L (ref 3.5–5.1)
SODIUM: 130 mmol/L — AB (ref 135–145)

## 2017-08-10 LAB — CBC
HCT: 22.4 % — ABNORMAL LOW (ref 39.0–52.0)
HEMOGLOBIN: 7.1 g/dL — AB (ref 13.0–17.0)
MCH: 27.3 pg (ref 26.0–34.0)
MCHC: 31.7 g/dL (ref 30.0–36.0)
MCV: 86.2 fL (ref 78.0–100.0)
Platelets: 91 10*3/uL — ABNORMAL LOW (ref 150–400)
RBC: 2.6 MIL/uL — AB (ref 4.22–5.81)
RDW: 21.1 % — ABNORMAL HIGH (ref 11.5–15.5)
WBC: 10.4 10*3/uL (ref 4.0–10.5)

## 2017-08-10 NOTE — Progress Notes (Signed)
Pulmonary Critical Care Medicine [facility] [place]   PULMONARY SERVICE  PROGRESS NOTE  Date of Service: 08/10/2017  Melvin Kelley  SKA:[id number]  DOB: Jan 10, 1956   DOA: Jun 05, 2017  Referring Physician: Carron Curie, MD  HPI: Melvin Kelley is a 62 y.o. male seen for follow up of Acute on Chronic Respiratory Failure.  Patient has been having some issues with blood pressure waxing and waning.  Pressure was low in the 70s now is little bit better in the 117 range.  Patient has been getting dialysis also.  Medications: Reviewed on Rounds  Physical Exam:  Vitals: Temperature is 97.6 blood pressure 73/38 this morning follow-up was 105/60 respiratory rate was 27 pulse 98  Ventilator Settings mode of ventilation assist control FiO2 28% tidal volume 458 PEEP 5  . General: Comfortable at this time . Eyes: Grossly normal lids, irises & conjunctiva . ENT: grossly tongue is normal . Neck: no obvious mass . Cardiovascular: S1 S2 normal no gallop . Respiratory: No rhonchi or rales are noted at this time. . Abdomen: soft . Skin: no rash seen on limited exam . Musculoskeletal: not rigid . Psychiatric:unable to assess . Neurologic: no seizure no involuntary movements         Lab Data:   Basic Metabolic Panel: Recent Labs  Lab 08/06/17 0513 08/07/17 0728 08/09/17 1028 08/10/17 0500  NA 130* 129*  --  130*  K 4.8 5.2*  --  4.9  CL 101 101  --  97*  CO2 23 22  --  27  GLUCOSE 237* 184*  --  127*  BUN 58* 63*  --  52*  CREATININE 1.48* 1.48*  --  1.34*  CALCIUM 8.4* 8.3*  --  8.4*  MG 1.8  --  1.8  --   PHOS 4.6 5.0*  --  4.5    Liver Function Tests: Recent Labs  Lab 08/06/17 0513 08/07/17 0728 08/10/17 0500  ALBUMIN 1.9* 1.8* 1.7*   No results for input(s): LIPASE, AMYLASE in the last 168 hours. No results for input(s): AMMONIA in the last 168 hours.  CBC: Recent Labs  Lab 08/06/17 0513 08/07/17 0913 08/10/17 0500  WBC 11.9* 18.6* 10.4  HGB 7.7*  8.6* 7.1*  HCT 24.7* 28.2* 22.4*  MCV 87.6 89.0 86.2  PLT 41* 180 91*    Cardiac Enzymes: No results for input(s): CKTOTAL, CKMB, CKMBINDEX, TROPONINI in the last 168 hours.  BNP (last 3 results) No results for input(s): BNP in the last 8760 hours.  ProBNP (last 3 results) No results for input(s): PROBNP in the last 8760 hours.  Radiological Exams: No results found.  Assessment/Plan Active Problems:   Cardiac arrest (HCC)   Acute on chronic respiratory failure (HCC)   CHF (congestive heart failure) (HCC)   Obstructive sleep apnea   Acute kidney injury (HCC)   1. Acute on chronic respiratory failure with hypoxia patient is at baseline continues to require dialysis for fluid removal.  Patient's not able to wean off the ventilator 2. Congestive heart failure continue to monitor fluid status continue with supportive care 3. Acute renal failure on dialysis 4. Status post cardiac arrest grossly unchanged 5. Sleep apnea nonissue   I have personally seen and evaluated the patient, evaluated laboratory and imaging results, formulated the assessment and plan and placed orders. The Patient requires high complexity decision making for assessment and support.  Case was discussed on Rounds with the Respiratory Therapy Staff  Yevonne Pax, MD [facility] Pulmonary Critical Care Medicine  Sleep Medicine

## 2017-08-10 NOTE — Progress Notes (Signed)
[facility]  ROUNDING NOTE   Subjective:  Patient seen at bedside. Worse this a.m. as he is significantly hypotensive. Blood pressure was found to be 72/37.  Objective:  Vital signs in last 24 hours:  Temperature 97.6 pulse 98 respirations 27 blood pressure 73/38  Physical Exam: General: Critically ill appearing  Head: Severe temporal wasting, NG in place  Eyes: Anicteric  Neck: Tracheostomy in place  Lungs:  Bilateral rhonchi, vent assisted  Heart: S1S2 no rubs  Abdomen:  Soft, nontender, bowel sounds present  Extremities: 1+ peripheral edema.  Neurologic: lethargic  Skin: Ulcers on feet noted  Access: L IJ temporary dialysis catheter    Basic Metabolic Panel: Recent Labs  Lab 08/06/17 0513 08/07/17 0728 08/09/17 1028  NA 130* 129*  --   K 4.8 5.2*  --   CL 101 101  --   CO2 23 22  --   GLUCOSE 237* 184*  --   BUN 58* 63*  --   CREATININE 1.48* 1.48*  --   CALCIUM 8.4* 8.3*  --   MG 1.8  --  1.8  PHOS 4.6 5.0*  --     Liver Function Tests: Recent Labs  Lab 08/06/17 0513 08/07/17 0728  ALBUMIN 1.9* 1.8*   No results for input(s): LIPASE, AMYLASE in the last 168 hours. No results for input(s): AMMONIA in the last 168 hours.  CBC: Recent Labs  Lab 08/06/17 0513 08/07/17 0913  WBC 11.9* 18.6*  HGB 7.7* 8.6*  HCT 24.7* 28.2*  MCV 87.6 89.0  PLT 41* 180    Cardiac Enzymes: No results for input(s): CKTOTAL, CKMB, CKMBINDEX, TROPONINI in the last 168 hours.  BNP: Invalid input(s): POCBNP  CBG: No results for input(s): GLUCAP in the last 168 hours.  Microbiology: Results for orders placed or performed during the hospital encounter of 06/10/2017  Culture, Urine     Status: Abnormal   Collection Time: 07/29/17 12:20 PM  Result Value Ref Range Status   Specimen Description URINE, RANDOM  Final   Special Requests NONE  Final   Culture (A)  Final    >=100,000 COLONIES/mL ESCHERICHIA COLI >=100,000 COLONIES/mL KLEBSIELLA  PNEUMONIAE Confirmed Extended Spectrum Beta-Lactamase Producer (ESBL).  In bloodstream infections from ESBL organisms, carbapenems are preferred over piperacillin/tazobactam. They are shown to have a lower risk of mortality. Performed at [facility], [address] [place], [place] [address]    Report Status 08/01/2017 FINAL  Final   Organism ID, Bacteria ESCHERICHIA COLI (A)  Final   Organism ID, Bacteria KLEBSIELLA PNEUMONIAE (A)  Final      Susceptibility   Escherichia coli - MIC*    AMPICILLIN >=32 RESISTANT Resistant     CEFAZOLIN <=4 SENSITIVE Sensitive     CEFTRIAXONE <=1 SENSITIVE Sensitive     CIPROFLOXACIN <=0.25 SENSITIVE Sensitive     GENTAMICIN <=1 SENSITIVE Sensitive     IMIPENEM <=0.25 SENSITIVE Sensitive     NITROFURANTOIN <=16 SENSITIVE Sensitive     TRIMETH/SULFA >=320 RESISTANT Resistant     AMPICILLIN/SULBACTAM 16 INTERMEDIATE Intermediate     PIP/TAZO <=4 SENSITIVE Sensitive     Extended ESBL NEGATIVE Sensitive     * >=100,000 COLONIES/mL ESCHERICHIA COLI   Klebsiella pneumoniae - MIC*    AMPICILLIN >=32 RESISTANT Resistant     CEFAZOLIN >=64 RESISTANT Resistant     CEFTRIAXONE >=64 RESISTANT Resistant     CIPROFLOXACIN 2 INTERMEDIATE Intermediate     GENTAMICIN <=1 SENSITIVE Sensitive  IMIPENEM <=0.25 SENSITIVE Sensitive     NITROFURANTOIN 32 SENSITIVE Sensitive     TRIMETH/SULFA >=320 RESISTANT Resistant     AMPICILLIN/SULBACTAM >=32 RESISTANT Resistant     PIP/TAZO 32 INTERMEDIATE Intermediate     Extended ESBL POSITIVE Resistant     * >=100,000 COLONIES/mL KLEBSIELLA PNEUMONIAE  C difficile quick scan w PCR reflex     Status: Abnormal   Collection Time: 07/29/17 12:24 PM  Result Value Ref Range Status   C Diff antigen POSITIVE (A) NEGATIVE Final   C Diff toxin NEGATIVE NEGATIVE Final   C Diff interpretation Results are indeterminate. See PCR results.  Final    Comment: Performed at [facility], [address], [place], [place]  [address]  C. Diff by PCR, Reflexed     Status: None   Collection Time: 07/29/17 12:24 PM  Result Value Ref Range Status   Toxigenic C. Difficile by PCR NEGATIVE NEGATIVE Final    Comment: Patient is colonized with non toxigenic C. difficile. May not need treatment unless significant symptoms are present. Performed at [facility], [address], [place], [place] [address]   Culture, blood (routine x 2)     Status: Abnormal   Collection Time: 07/29/17 12:37 PM  Result Value Ref Range Status   Specimen Description BLOOD LEFT ANTECUBITAL  Final   Special Requests   Final    BOTTLES DRAWN AEROBIC ONLY Blood Culture results may not be optimal due to an inadequate volume of blood received in culture bottles   Culture  Setup Time   Final    AEROBIC BOTTLE ONLY GRAM NEGATIVE RODS CRITICAL RESULT CALLED TO, READ BACK BY AND VERIFIED WITH: Hartley Barefoot RN 07/30/17 0139 JDW    Culture (A)  Final    ESCHERICHIA COLI KLEBSIELLA PNEUMONIAE Confirmed Extended Spectrum Beta-Lactamase Producer (ESBL).  In bloodstream infections from ESBL organisms, carbapenems are preferred over piperacillin/tazobactam. They are shown to have a lower risk of mortality. Performed at [facility], [address], [place], [place] [address]    Report Status 08/01/2017 FINAL  Final   Organism ID, Bacteria ESCHERICHIA COLI  Final   Organism ID, Bacteria KLEBSIELLA PNEUMONIAE  Final      Susceptibility   Escherichia coli - MIC*    AMPICILLIN >=32 RESISTANT Resistant     CEFAZOLIN <=4 SENSITIVE Sensitive     CEFEPIME <=1 SENSITIVE Sensitive     CEFTAZIDIME <=1 SENSITIVE Sensitive     CEFTRIAXONE <=1 SENSITIVE Sensitive     CIPROFLOXACIN <=0.25 SENSITIVE Sensitive     GENTAMICIN <=1 SENSITIVE Sensitive     IMIPENEM <=0.25 SENSITIVE Sensitive     TRIMETH/SULFA >=320 RESISTANT Resistant     AMPICILLIN/SULBACTAM 16 INTERMEDIATE Intermediate     PIP/TAZO <=4 SENSITIVE Sensitive     Extended ESBL NEGATIVE Sensitive      * ESCHERICHIA COLI   Klebsiella pneumoniae - MIC*    AMPICILLIN >=32 RESISTANT Resistant     CEFAZOLIN >=64 RESISTANT Resistant     CEFEPIME 32 RESISTANT Resistant     CEFTAZIDIME RESISTANT Resistant     CEFTRIAXONE >=64 RESISTANT Resistant     CIPROFLOXACIN 2 INTERMEDIATE Intermediate     GENTAMICIN <=1 SENSITIVE Sensitive     IMIPENEM <=0.25 SENSITIVE Sensitive     TRIMETH/SULFA >=320 RESISTANT Resistant     AMPICILLIN/SULBACTAM >=32 RESISTANT Resistant     PIP/TAZO 32 INTERMEDIATE Intermediate     Extended ESBL POSITIVE Resistant     *  KLEBSIELLA PNEUMONIAE  Culture, blood (routine x 2)     Status: Abnormal   Collection Time: 07/29/17 12:37 PM  Result Value Ref Range Status   Specimen Description BLOOD LEFT ANTECUBITAL  Final   Special Requests   Final    BOTTLES DRAWN AEROBIC ONLY Blood Culture results may not be optimal due to an inadequate volume of blood received in culture bottles   Culture  Setup Time   Final    AEROBIC BOTTLE ONLY GRAM NEGATIVE RODS CRITICAL VALUE NOTED.  VALUE IS CONSISTENT WITH PREVIOUSLY REPORTED AND CALLED VALUE.    Culture (A)  Final    KLEBSIELLA PNEUMONIAE SUSCEPTIBILITIES PERFORMED ON PREVIOUS CULTURE WITHIN THE LAST 5 DAYS. Performed at [facility], [address] [place], [place] [address]    Report Status 08/01/2017 FINAL  Final  Blood Culture ID Panel (Reflexed)     Status: Abnormal   Collection Time: 07/29/17 12:37 PM  Result Value Ref Range Status   Enterococcus species NOT DETECTED NOT DETECTED Final   Listeria monocytogenes NOT DETECTED NOT DETECTED Final   Staphylococcus species NOT DETECTED NOT DETECTED Final   Staphylococcus aureus NOT DETECTED NOT DETECTED Final   Streptococcus species NOT DETECTED NOT DETECTED Final   Streptococcus agalactiae NOT DETECTED NOT DETECTED Final   Streptococcus pneumoniae NOT DETECTED NOT DETECTED Final   Streptococcus pyogenes NOT DETECTED NOT DETECTED Final   Acinetobacter baumannii  NOT DETECTED NOT DETECTED Final   Enterobacteriaceae species DETECTED (A) NOT DETECTED Final    Comment: CRITICAL RESULT CALLED TO, READ BACK BY AND VERIFIED WITH: Hartley Barefoot RN 07/30/17 0136 JDW    Enterobacter cloacae complex NOT DETECTED NOT DETECTED Final   Escherichia coli DETECTED (A) NOT DETECTED Final    Comment: CRITICAL RESULT CALLED TO, READ BACK BY AND VERIFIED WITH: Hartley Barefoot RN 07/30/17 0136 JDW    Klebsiella oxytoca NOT DETECTED NOT DETECTED Final   Klebsiella pneumoniae DETECTED (A) NOT DETECTED Final    Comment: CRITICAL RESULT CALLED TO, READ BACK BY AND VERIFIED WITH: Hartley Barefoot RN 07/30/17 0136 JDW    Proteus species NOT DETECTED NOT DETECTED Final   Serratia marcescens NOT DETECTED NOT DETECTED Final   Carbapenem resistance NOT DETECTED NOT DETECTED Final   Haemophilus influenzae NOT DETECTED NOT DETECTED Final   Neisseria meningitidis NOT DETECTED NOT DETECTED Final   Pseudomonas aeruginosa NOT DETECTED NOT DETECTED Final   Candida albicans NOT DETECTED NOT DETECTED Final   Candida glabrata NOT DETECTED NOT DETECTED Final   Candida krusei NOT DETECTED NOT DETECTED Final   Candida parapsilosis NOT DETECTED NOT DETECTED Final   Candida tropicalis NOT DETECTED NOT DETECTED Final    Comment: Performed at [facility], [address], [place], [place] [address]    Coagulation Studies: No results for input(s): LABPROT, INR in the last 72 hours.  Urinalysis: No results for input(s): COLORURINE, LABSPEC, PHURINE, GLUCOSEU, HGBUR, BILIRUBINUR, KETONESUR, PROTEINUR, UROBILINOGEN, NITRITE, LEUKOCYTESUR in the last 72 hours.  Invalid input(s): APPERANCEUR    Imaging: No results found.   Medications:     lidocaine (PF)  Assessment/ Plan:  62 y.o. 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   1.  Severe acute renal failure. 2.  Hyponatremia, Na 129 3.  Acute respiratory failure, worsening, back on ventilator. 4.  Anemia unspecified, Hgb 8.6 5.  Severe protein calorie malnutrition with severe emaciation, albumin 1.8 6.  Hyperkalemia, K 5.2 7.  Multiorganism sepsis.  8.  Hypotension.   Plan: Blood pressure worse at the moment at 73/37.  He will likely need to go back on the pressors before dialysis can be started today.  Nursing to discuss this with hospitalist.  Once blood pressure is improved we will attempt hemodialysis.  Overall patient continues to be quite ill without significant progress over the past week.  He remains on the ventilator at the moment.  Overall prognosis remains guarded.   LOS: 0 Fielding Mault 6/24/20198:25 AM

## 2017-08-11 NOTE — Progress Notes (Signed)
Pulmonary Critical Care Medicine [facility] [place]   PULMONARY SERVICE  PROGRESS NOTE  Date of Service: 08/11/2017  Melvin Kelley  SMO:[id number]  DOB: 1955/05/10   DOA: 06/07/2017  Referring Physician: Carron Curie, MD  HPI: Melvin Kelley is a 62 y.o. male seen for follow up of Acute on Chronic Respiratory Failure.  He is not weaning will remains on the ventilator and full support currently is in assist control mode has been on 28% FiO2  Medications: Reviewed on Rounds  Physical Exam:  Vitals: Temperature 98.2 pulse 103 respiratory rate 28 blood pressure 109/64 saturations 98%  Ventilator Settings mode of ventilation is assist control FiO2 28% tidal volume 409 PEEP 5  . General: Comfortable at this time . Eyes: Grossly normal lids, irises & conjunctiva . ENT: grossly tongue is normal . Neck: no obvious mass . Cardiovascular: S1 S2 normal no gallop . Respiratory: No rhonchi noted at this time . Abdomen: soft . Skin: no rash seen on limited exam . Musculoskeletal: not rigid . Psychiatric:unable to assess . Neurologic: no seizure no involuntary movements         Lab Data:   Basic Metabolic Panel: Recent Labs  Lab 08/06/17 0513 08/07/17 0728 08/09/17 1028 08/10/17 0500  NA 130* 129*  --  130*  K 4.8 5.2*  --  4.9  CL 101 101  --  97*  CO2 23 22  --  27  GLUCOSE 237* 184*  --  127*  BUN 58* 63*  --  52*  CREATININE 1.48* 1.48*  --  1.34*  CALCIUM 8.4* 8.3*  --  8.4*  MG 1.8  --  1.8  --   PHOS 4.6 5.0*  --  4.5    Liver Function Tests: Recent Labs  Lab 08/06/17 0513 08/07/17 0728 08/10/17 0500  ALBUMIN 1.9* 1.8* 1.7*   No results for input(s): LIPASE, AMYLASE in the last 168 hours. No results for input(s): AMMONIA in the last 168 hours.  CBC: Recent Labs  Lab 08/06/17 0513 08/07/17 0913 08/10/17 0500  WBC 11.9* 18.6* 10.4  HGB 7.7* 8.6* 7.1*  HCT 24.7* 28.2* 22.4*  MCV 87.6 89.0 86.2  PLT 41* 180 91*    Cardiac Enzymes: No  results for input(s): CKTOTAL, CKMB, CKMBINDEX, TROPONINI in the last 168 hours.  BNP (last 3 results) No results for input(s): BNP in the last 8760 hours.  ProBNP (last 3 results) No results for input(s): PROBNP in the last 8760 hours.  Radiological Exams: No results found.  Assessment/Plan Active Problems:   Cardiac arrest (HCC)   Acute on chronic respiratory failure (HCC)   CHF (congestive heart failure) (HCC)   Obstructive sleep apnea   Acute kidney injury (HCC)   1. Acute on chronic respiratory failure with hypoxia patient continues on full vent support not able to wean 2. Acute renal failure patient is on dialysis prognosis guarded 3. Congestive heart failure fluid overloaded will be followed by nephrology 4. Status post cardiac arrest grossly unchanged 5. Sleep apnea nonissue 6. Overall the patient's prognosis is poor for being able to come off of the ventilator and he will require ongoing ventilatory support   I have personally seen and evaluated the patient, evaluated laboratory and imaging results, formulated the assessment and plan and placed orders. The Patient requires high complexity decision making for assessment and support.  Case was discussed on Rounds with the Respiratory Therapy Staff  Yevonne Pax, MD [facility] Pulmonary Critical Care Medicine Sleep Medicine

## 2017-08-12 LAB — CBC
HCT: 22.8 % — ABNORMAL LOW (ref 39.0–52.0)
Hemoglobin: 7.3 g/dL — ABNORMAL LOW (ref 13.0–17.0)
MCH: 28.2 pg (ref 26.0–34.0)
MCHC: 32 g/dL (ref 30.0–36.0)
MCV: 88 fL (ref 78.0–100.0)
PLATELETS: 52 10*3/uL — AB (ref 150–400)
RBC: 2.59 MIL/uL — AB (ref 4.22–5.81)
RDW: 22.2 % — AB (ref 11.5–15.5)
WBC: 9.1 10*3/uL (ref 4.0–10.5)

## 2017-08-12 LAB — C DIFFICILE QUICK SCREEN W PCR REFLEX
C DIFFICILE (CDIFF) TOXIN: NEGATIVE
C Diff antigen: NEGATIVE
C Diff interpretation: NOT DETECTED

## 2017-08-12 LAB — RENAL FUNCTION PANEL
Albumin: 1.6 g/dL — ABNORMAL LOW (ref 3.5–5.0)
Anion gap: 7 (ref 5–15)
BUN: 50 mg/dL — ABNORMAL HIGH (ref 6–20)
CHLORIDE: 92 mmol/L — AB (ref 98–111)
CO2: 26 mmol/L (ref 22–32)
CREATININE: 1.8 mg/dL — AB (ref 0.61–1.24)
Calcium: 8.1 mg/dL — ABNORMAL LOW (ref 8.9–10.3)
GFR calc non Af Amer: 39 mL/min — ABNORMAL LOW (ref >60)
GFR, EST AFRICAN AMERICAN: 45 mL/min — AB (ref >60)
Glucose, Bld: 195 mg/dL — ABNORMAL HIGH (ref 70–99)
Phosphorus: 5.4 mg/dL — ABNORMAL HIGH (ref 2.5–4.6)
Potassium: 4.9 mmol/L (ref 3.5–5.1)
Sodium: 125 mmol/L — ABNORMAL LOW (ref 135–145)

## 2017-08-12 LAB — MAGNESIUM: Magnesium: 1.8 mg/dL (ref 1.7–2.4)

## 2017-08-12 NOTE — Progress Notes (Signed)
[facility]  ROUNDING NOTE   Subjective:  Patient seen at bedside. Still remains critically ill. Currently on the ventilator.  Objective:  Vital signs in last 24 hours:  Temperature 97.4 pulse 87 respirations 36 blood pressure 122/62  Physical Exam: General: Critically ill appearing  Head: Severe temporal wasting, NG in place  Eyes: Anicteric  Neck: Tracheostomy in place  Lungs:  Bilateral rhonchi, vent assisted  Heart: S1S2 no rubs  Abdomen:  Soft, nontender, bowel sounds present  Extremities: 1+ peripheral edema.  Neurologic: lethargic  Skin: Ulcers on feet noted  Access: L IJ temporary dialysis catheter    Basic Metabolic Panel: Recent Labs  Lab 08/06/17 0513 08/07/17 0728 08/09/17 1028 08/10/17 0500 08/12/17 0711  NA 130* 129*  --  130* 125*  K 4.8 5.2*  --  4.9 4.9  CL 101 101  --  97* 92*  CO2 23 22  --  27 26  GLUCOSE 237* 184*  --  127* 195*  BUN 58* 63*  --  52* 50*  CREATININE 1.48* 1.48*  --  1.34* 1.80*  CALCIUM 8.4* 8.3*  --  8.4* 8.1*  MG 1.8  --  1.8  --  1.8  PHOS 4.6 5.0*  --  4.5 5.4*    Liver Function Tests: Recent Labs  Lab 08/06/17 0513 08/07/17 0728 08/10/17 0500 08/12/17 0711  ALBUMIN 1.9* 1.8* 1.7* 1.6*   No results for input(s): LIPASE, AMYLASE in the last 168 hours. No results for input(s): AMMONIA in the last 168 hours.  CBC: Recent Labs  Lab 08/06/17 0513 08/07/17 0913 08/10/17 0500 08/12/17 0711  WBC 11.9* 18.6* 10.4 9.1  HGB 7.7* 8.6* 7.1* 7.3*  HCT 24.7* 28.2* 22.4* 22.8*  MCV 87.6 89.0 86.2 88.0  PLT 41* 180 91* 52*    Cardiac Enzymes: No results for input(s): CKTOTAL, CKMB, CKMBINDEX, TROPONINI in the last 168 hours.  BNP: Invalid input(s): POCBNP  CBG: No results for input(s): GLUCAP in the last 168 hours.  Microbiology: Results for orders placed or performed during the hospital encounter of 06/05/2017  Culture, Urine     Status: Abnormal   Collection Time: 07/29/17 12:20 PM  Result  Value Ref Range Status   Specimen Description URINE, RANDOM  Final   Special Requests NONE  Final   Culture (A)  Final    >=100,000 COLONIES/mL ESCHERICHIA COLI >=100,000 COLONIES/mL KLEBSIELLA PNEUMONIAE Confirmed Extended Spectrum Beta-Lactamase Producer (ESBL).  In bloodstream infections from ESBL organisms, carbapenems are preferred over piperacillin/tazobactam. They are shown to have a lower risk of mortality. Performed at [facility], [address] [place], [place] [address]    Report Status 08/01/2017 FINAL  Final   Organism ID, Bacteria ESCHERICHIA COLI (A)  Final   Organism ID, Bacteria KLEBSIELLA PNEUMONIAE (A)  Final      Susceptibility   Escherichia coli - MIC*    AMPICILLIN >=32 RESISTANT Resistant     CEFAZOLIN <=4 SENSITIVE Sensitive     CEFTRIAXONE <=1 SENSITIVE Sensitive     CIPROFLOXACIN <=0.25 SENSITIVE Sensitive     GENTAMICIN <=1 SENSITIVE Sensitive     IMIPENEM <=0.25 SENSITIVE Sensitive     NITROFURANTOIN <=16 SENSITIVE Sensitive     TRIMETH/SULFA >=320 RESISTANT Resistant     AMPICILLIN/SULBACTAM 16 INTERMEDIATE Intermediate     PIP/TAZO <=4 SENSITIVE Sensitive     Extended ESBL NEGATIVE Sensitive     * >=100,000 COLONIES/mL ESCHERICHIA COLI   Klebsiella pneumoniae - MIC*    AMPICILLIN >=  32 RESISTANT Resistant     CEFAZOLIN >=64 RESISTANT Resistant     CEFTRIAXONE >=64 RESISTANT Resistant     CIPROFLOXACIN 2 INTERMEDIATE Intermediate     GENTAMICIN <=1 SENSITIVE Sensitive     IMIPENEM <=0.25 SENSITIVE Sensitive     NITROFURANTOIN 32 SENSITIVE Sensitive     TRIMETH/SULFA >=320 RESISTANT Resistant     AMPICILLIN/SULBACTAM >=32 RESISTANT Resistant     PIP/TAZO 32 INTERMEDIATE Intermediate     Extended ESBL POSITIVE Resistant     * >=100,000 COLONIES/mL KLEBSIELLA PNEUMONIAE  C difficile quick scan w PCR reflex     Status: Abnormal   Collection Time: 07/29/17 12:24 PM  Result Value Ref Range Status   C Diff antigen POSITIVE (A) NEGATIVE Final    C Diff toxin NEGATIVE NEGATIVE Final   C Diff interpretation Results are indeterminate. See PCR results.  Final    Comment: Performed at [facility], [address], [place], [place] [address]  C. Diff by PCR, Reflexed     Status: None   Collection Time: 07/29/17 12:24 PM  Result Value Ref Range Status   Toxigenic C. Difficile by PCR NEGATIVE NEGATIVE Final    Comment: Patient is colonized with non toxigenic C. difficile. May not need treatment unless significant symptoms are present. Performed at [facility], [address] [place], [place] [address]   Culture, blood (routine x 2)     Status: Abnormal   Collection Time: 07/29/17 12:37 PM  Result Value Ref Range Status   Specimen Description BLOOD LEFT ANTECUBITAL  Final   Special Requests   Final    BOTTLES DRAWN AEROBIC ONLY Blood Culture results may not be optimal due to an inadequate volume of blood received in culture bottles   Culture  Setup Time   Final    AEROBIC BOTTLE ONLY GRAM NEGATIVE RODS CRITICAL RESULT CALLED TO, READ BACK BY AND VERIFIED WITH: Hartley Barefoot RN 07/30/17 0139 JDW    Culture (A)  Final    ESCHERICHIA COLI KLEBSIELLA PNEUMONIAE Confirmed Extended Spectrum Beta-Lactamase Producer (ESBL).  In bloodstream infections from ESBL organisms, carbapenems are preferred over piperacillin/tazobactam. They are shown to have a lower risk of mortality. Performed at [facility], [address] [place], [place] [address]    Report Status 08/01/2017 FINAL  Final   Organism ID, Bacteria ESCHERICHIA COLI  Final   Organism ID, Bacteria KLEBSIELLA PNEUMONIAE  Final      Susceptibility   Escherichia coli - MIC*    AMPICILLIN >=32 RESISTANT Resistant     CEFAZOLIN <=4 SENSITIVE Sensitive     CEFEPIME <=1 SENSITIVE Sensitive     CEFTAZIDIME <=1 SENSITIVE Sensitive     CEFTRIAXONE <=1 SENSITIVE Sensitive     CIPROFLOXACIN <=0.25 SENSITIVE Sensitive     GENTAMICIN <=1 SENSITIVE Sensitive     IMIPENEM <=0.25  SENSITIVE Sensitive     TRIMETH/SULFA >=320 RESISTANT Resistant     AMPICILLIN/SULBACTAM 16 INTERMEDIATE Intermediate     PIP/TAZO <=4 SENSITIVE Sensitive     Extended ESBL NEGATIVE Sensitive     * ESCHERICHIA COLI   Klebsiella pneumoniae - MIC*    AMPICILLIN >=32 RESISTANT Resistant     CEFAZOLIN >=64 RESISTANT Resistant     CEFEPIME 32 RESISTANT Resistant     CEFTAZIDIME RESISTANT Resistant     CEFTRIAXONE >=64 RESISTANT Resistant     CIPROFLOXACIN 2 INTERMEDIATE Intermediate     GENTAMICIN <=1 SENSITIVE Sensitive     IMIPENEM <=0.25  SENSITIVE Sensitive     TRIMETH/SULFA >=320 RESISTANT Resistant     AMPICILLIN/SULBACTAM >=32 RESISTANT Resistant     PIP/TAZO 32 INTERMEDIATE Intermediate     Extended ESBL POSITIVE Resistant     * KLEBSIELLA PNEUMONIAE  Culture, blood (routine x 2)     Status: Abnormal   Collection Time: 07/29/17 12:37 PM  Result Value Ref Range Status   Specimen Description BLOOD LEFT ANTECUBITAL  Final   Special Requests   Final    BOTTLES DRAWN AEROBIC ONLY Blood Culture results may not be optimal due to an inadequate volume of blood received in culture bottles   Culture  Setup Time   Final    AEROBIC BOTTLE ONLY GRAM NEGATIVE RODS CRITICAL VALUE NOTED.  VALUE IS CONSISTENT WITH PREVIOUSLY REPORTED AND CALLED VALUE.    Culture (A)  Final    KLEBSIELLA PNEUMONIAE SUSCEPTIBILITIES PERFORMED ON PREVIOUS CULTURE WITHIN THE LAST 5 DAYS. Performed at [facility], [address], [place], [place] [address]    Report Status 08/01/2017 FINAL  Final  Blood Culture ID Panel (Reflexed)     Status: Abnormal   Collection Time: 07/29/17 12:37 PM  Result Value Ref Range Status   Enterococcus species NOT DETECTED NOT DETECTED Final   Listeria monocytogenes NOT DETECTED NOT DETECTED Final   Staphylococcus species NOT DETECTED NOT DETECTED Final   Staphylococcus aureus NOT DETECTED NOT DETECTED Final   Streptococcus species NOT DETECTED NOT DETECTED Final    Streptococcus agalactiae NOT DETECTED NOT DETECTED Final   Streptococcus pneumoniae NOT DETECTED NOT DETECTED Final   Streptococcus pyogenes NOT DETECTED NOT DETECTED Final   Acinetobacter baumannii NOT DETECTED NOT DETECTED Final   Enterobacteriaceae species DETECTED (A) NOT DETECTED Final    Comment: CRITICAL RESULT CALLED TO, READ BACK BY AND VERIFIED WITH: Hartley Barefoot RN 07/30/17 0136 JDW    Enterobacter cloacae complex NOT DETECTED NOT DETECTED Final   Escherichia coli DETECTED (A) NOT DETECTED Final    Comment: CRITICAL RESULT CALLED TO, READ BACK BY AND VERIFIED WITH: Hartley Barefoot RN 07/30/17 0136 JDW    Klebsiella oxytoca NOT DETECTED NOT DETECTED Final   Klebsiella pneumoniae DETECTED (A) NOT DETECTED Final    Comment: CRITICAL RESULT CALLED TO, READ BACK BY AND VERIFIED WITH: Hartley Barefoot RN 07/30/17 0136 JDW    Proteus species NOT DETECTED NOT DETECTED Final   Serratia marcescens NOT DETECTED NOT DETECTED Final   Carbapenem resistance NOT DETECTED NOT DETECTED Final   Haemophilus influenzae NOT DETECTED NOT DETECTED Final   Neisseria meningitidis NOT DETECTED NOT DETECTED Final   Pseudomonas aeruginosa NOT DETECTED NOT DETECTED Final   Candida albicans NOT DETECTED NOT DETECTED Final   Candida glabrata NOT DETECTED NOT DETECTED Final   Candida krusei NOT DETECTED NOT DETECTED Final   Candida parapsilosis NOT DETECTED NOT DETECTED Final   Candida tropicalis NOT DETECTED NOT DETECTED Final    Comment: Performed at [facility], [address] [place], [place] [address]    Coagulation Studies: No results for input(s): LABPROT, INR in the last 72 hours.  Urinalysis: No results for input(s): COLORURINE, LABSPEC, PHURINE, GLUCOSEU, HGBUR, BILIRUBINUR, KETONESUR, PROTEINUR, UROBILINOGEN, NITRITE, LEUKOCYTESUR in the last 72 hours.  Invalid input(s): APPERANCEUR    Imaging: No results found.   Medications:     lidocaine (PF)  Assessment/ Plan:  62 y.o. 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   1.  Severe acute renal failure. 2.  Hyponatremia, Na 125 3.  Acute respiratory failure, currently on ventilator. 4.  Anemia unspecified, Hgb 7.3 5.  Severe protein calorie malnutrition with severe emaciation, albumin 1.8 6.  Hyperkalemia, K 4.9 7.  Multiorganism sepsis.  8.  Hypotension.   Plan: Patient will be due for hemodialysis later today.  Orders have been prepared.  Overall he remains critically ill.  We will plan for ultrafiltration target of 0.5 to 1 kg.  Thereafter next dialysis will be on Friday.  Continue to monitor hemoglobin as it is down to 7.3.  Consider blood transfusion for hemoglobin of 7 or less.  As before patient has a very guarded prognosis.   LOS: 0 Melvin Kelley 6/26/20198:27 AM

## 2017-08-12 NOTE — Progress Notes (Signed)
Pulmonary Critical Care Medicine [facility] [place]   PULMONARY SERVICE  PROGRESS NOTE  Date of Service: 08/12/2017  Melvin Kelley  HRC:[id number]  DOB: [birth year]   DOA: 05/21/2017  Referring Physician: Carron Curie, MD  HPI: Melvin Kelley is a 62 y.o. male seen for follow up of Acute on Chronic Respiratory Failure.  Continues on full vent support he has not been able to tolerate any weaning.  I do not believe that he is going to be able to come off of the ventilator secondary to multi-system involvement with renal failure along with respiratory failure and cardiac disease.  Medications: Reviewed on Rounds  Physical Exam:  Vitals: Temperature 97.4 pulse 87 respiratory 36 blood pressure 122/62 saturations 96%  Ventilator Settings mode of ventilation assist control FiO2 28% PEEP 5  . General: Comfortable at this time . Eyes: Grossly normal lids, irises & conjunctiva . ENT: grossly tongue is normal . Neck: no obvious mass . Cardiovascular: S1 S2 normal no gallop . Respiratory: Coarse breath sounds no rhonchi noted . Abdomen: soft . Skin: no rash seen on limited exam . Musculoskeletal: not rigid . Psychiatric:unable to assess . Neurologic: no seizure no involuntary movements         Lab Data:   Basic Metabolic Panel: Recent Labs  Lab 08/06/17 0513 08/07/17 0728 08/09/17 1028 08/10/17 0500 08/12/17 0711  NA 130* 129*  --  130* 125*  K 4.8 5.2*  --  4.9 4.9  CL 101 101  --  97* 92*  CO2 23 22  --  27 26  GLUCOSE 237* 184*  --  127* 195*  BUN 58* 63*  --  52* 50*  CREATININE 1.48* 1.48*  --  1.34* 1.80*  CALCIUM 8.4* 8.3*  --  8.4* 8.1*  MG 1.8  --  1.8  --  1.8  PHOS 4.6 5.0*  --  4.5 5.4*    Liver Function Tests: Recent Labs  Lab 08/06/17 0513 08/07/17 0728 08/10/17 0500 08/12/17 0711  ALBUMIN 1.9* 1.8* 1.7* 1.6*   No results for input(s): LIPASE, AMYLASE in the last 168 hours. No results for input(s): AMMONIA in the last 168  hours.  CBC: Recent Labs  Lab 08/06/17 0513 08/07/17 0913 08/10/17 0500 08/12/17 0711  WBC 11.9* 18.6* 10.4 9.1  HGB 7.7* 8.6* 7.1* 7.3*  HCT 24.7* 28.2* 22.4* 22.8*  MCV 87.6 89.0 86.2 88.0  PLT 41* 180 91* 52*    Cardiac Enzymes: No results for input(s): CKTOTAL, CKMB, CKMBINDEX, TROPONINI in the last 168 hours.  BNP (last 3 results) No results for input(s): BNP in the last 8760 hours.  ProBNP (last 3 results) No results for input(s): PROBNP in the last 8760 hours.  Radiological Exams: No results found.  Assessment/Plan Active Problems:   Cardiac arrest (HCC)   Acute on chronic respiratory failure (HCC)   CHF (congestive heart failure) (HCC)   Obstructive sleep apnea   Acute kidney injury (HCC)   1. Acute on chronic respiratory failure with hypoxia we will continue with full spent as noted patient is not able to wean off of the ventilator will be continued on assist control mode. 2. Congestive heart failure monitor fluid status closely. 3. Acute renal failure followed by nephrology will continue with supportive care. 4. Cardiac arrest at baseline 5. Sleep apnea on the ventilator   I have personally seen and evaluated the patient, evaluated laboratory and imaging results, formulated the assessment and plan and placed orders. The Patient requires high complexity  decision making for assessment and support.  Case was discussed on Rounds with the Respiratory Therapy Staff  Allyne Gee, MD [facility] Pulmonary Critical Care Medicine Sleep Medicine

## 2017-08-14 LAB — RENAL FUNCTION PANEL
ALBUMIN: 1.6 g/dL — AB (ref 3.5–5.0)
ANION GAP: 7 (ref 5–15)
BUN: 41 mg/dL — ABNORMAL HIGH (ref 6–20)
CO2: 28 mmol/L (ref 22–32)
Calcium: 8.2 mg/dL — ABNORMAL LOW (ref 8.9–10.3)
Chloride: 97 mmol/L — ABNORMAL LOW (ref 98–111)
Creatinine, Ser: 1.63 mg/dL — ABNORMAL HIGH (ref 0.61–1.24)
GFR calc Af Amer: 51 mL/min — ABNORMAL LOW (ref >60)
GFR, EST NON AFRICAN AMERICAN: 44 mL/min — AB (ref >60)
GLUCOSE: 158 mg/dL — AB (ref 70–99)
PHOSPHORUS: 5.3 mg/dL — AB (ref 2.5–4.6)
POTASSIUM: 4.8 mmol/L (ref 3.5–5.1)
Sodium: 132 mmol/L — ABNORMAL LOW (ref 135–145)

## 2017-08-14 LAB — CBC
HEMATOCRIT: 19.5 % — AB (ref 39.0–52.0)
HEMOGLOBIN: 6.1 g/dL — AB (ref 13.0–17.0)
MCH: 28.1 pg (ref 26.0–34.0)
MCHC: 31.3 g/dL (ref 30.0–36.0)
MCV: 89.9 fL (ref 78.0–100.0)
Platelets: 58 10*3/uL — ABNORMAL LOW (ref 150–400)
RBC: 2.17 MIL/uL — ABNORMAL LOW (ref 4.22–5.81)
RDW: 22.6 % — ABNORMAL HIGH (ref 11.5–15.5)
WBC: 7.4 10*3/uL (ref 4.0–10.5)

## 2017-08-14 LAB — URINE CULTURE

## 2017-08-14 LAB — PREPARE RBC (CROSSMATCH)

## 2017-08-14 NOTE — Progress Notes (Signed)
Pulmonary Critical Care Medicine [facility] [place]   PULMONARY SERVICE  PROGRESS NOTE  Date of Service: 08/14/2017  Melvin Kelley  YBW:[id number]  DOB: 07/13/1955   DOA: 05/28/2017  Referring Physician: Carron Curie, MD  HPI: Melvin Kelley is a 62 y.o. male seen for follow up of Acute on Chronic Respiratory Failure.  Patient remains on full support currently on assist control has been on 28% FiO2 with a PEEP of 5 he is not wean able off the ventilator  Medications: Reviewed on Rounds  Physical Exam:  Vitals: Temperature is 97.0 pulse 96 respiratory rate 30 blood pressure 115/68 saturations 98%  Ventilator Settings mode of ventilation assist control FiO2 20% tidal volume 453 PEEP 5  . General: Comfortable at this time . Eyes: Grossly normal lids, irises & conjunctiva . ENT: grossly tongue is normal . Neck: no obvious mass . Cardiovascular: S1 S2 normal no gallop . Respiratory: No rhonchi or rales are noted . Abdomen: soft . Skin: no rash seen on limited exam . Musculoskeletal: not rigid . Psychiatric:unable to assess . Neurologic: no seizure no involuntary movements         Lab Data:   Basic Metabolic Panel: Recent Labs  Lab 08/09/17 1028 08/10/17 0500 08/12/17 0711  NA  --  130* 125*  K  --  4.9 4.9  CL  --  97* 92*  CO2  --  27 26  GLUCOSE  --  127* 195*  BUN  --  52* 50*  CREATININE  --  1.34* 1.80*  CALCIUM  --  8.4* 8.1*  MG 1.8  --  1.8  PHOS  --  4.5 5.4*    Liver Function Tests: Recent Labs  Lab 08/10/17 0500 08/12/17 0711  ALBUMIN 1.7* 1.6*   No results for input(s): LIPASE, AMYLASE in the last 168 hours. No results for input(s): AMMONIA in the last 168 hours.  CBC: Recent Labs  Lab 08/10/17 0500 08/12/17 0711  WBC 10.4 9.1  HGB 7.1* 7.3*  HCT 22.4* 22.8*  MCV 86.2 88.0  PLT 91* 52*    Cardiac Enzymes: No results for input(s): CKTOTAL, CKMB, CKMBINDEX, TROPONINI in the last 168 hours.  BNP (last 3 results) No  results for input(s): BNP in the last 8760 hours.  ProBNP (last 3 results) No results for input(s): PROBNP in the last 8760 hours.  Radiological Exams: No results found.  Assessment/Plan Active Problems:   Cardiac arrest (HCC)   Acute on chronic respiratory failure (HCC)   CHF (congestive heart failure) (HCC)   Obstructive sleep apnea   Acute kidney injury (HCC)   1. Acute on chronic respiratory failure with hypoxia we will continue with full support on assist control mode patient is tolerating current settings.  As mentioned above patient is not weaning well has failed multiple attempts at being able to come off of the ventilator. 2. Congestive heart failure stable at this time monitor fluid status. 3. Acute renal failure patient is followed by nephrology for dialysis will continue 4. Sleep apnea nonissue 5. Cardiac arrest rhythm is stable   I have personally seen and evaluated the patient, evaluated laboratory and imaging results, formulated the assessment and plan and placed orders. The Patient requires high complexity decision making for assessment and support.  Case was discussed on Rounds with the Respiratory Therapy Staff  Yevonne Pax, MD [facility] Pulmonary Critical Care Medicine Sleep Medicine

## 2017-08-14 NOTE — Progress Notes (Signed)
[facility]  ROUNDING NOTE   Subjective:  Patient remains critically ill at the moment. Still on TPN. Continues to undergo dialysis 3 times per week.  Objective:  Vital signs in last 24 hours:  Temperature 97 pulse 96 respirations 30 blood pressure 115/68  Physical Exam: General: Critically ill appearing  Head: Severe temporal wasting, NG in place  Eyes: Anicteric  Neck: Tracheostomy in place  Lungs:  Bilateral rhonchi, vent assisted  Heart: S1S2 no rubs  Abdomen:  Soft, nontender, bowel sounds present  Extremities: 1+ peripheral edema.  Neurologic: lethargic  Skin: Ulcers on feet noted  Access: L IJ temporary dialysis catheter    Basic Metabolic Panel: Recent Labs  Lab 08/09/17 1028 08/10/17 0500 08/12/17 0711  NA  --  130* 125*  K  --  4.9 4.9  CL  --  97* 92*  CO2  --  27 26  GLUCOSE  --  127* 195*  BUN  --  52* 50*  CREATININE  --  1.34* 1.80*  CALCIUM  --  8.4* 8.1*  MG 1.8  --  1.8  PHOS  --  4.5 5.4*    Liver Function Tests: Recent Labs  Lab 08/10/17 0500 08/12/17 0711  ALBUMIN 1.7* 1.6*   No results for input(s): LIPASE, AMYLASE in the last 168 hours. No results for input(s): AMMONIA in the last 168 hours.  CBC: Recent Labs  Lab 08/07/17 0913 08/10/17 0500 08/12/17 0711  WBC 18.6* 10.4 9.1  HGB 8.6* 7.1* 7.3*  HCT 28.2* 22.4* 22.8*  MCV 89.0 86.2 88.0  PLT 180 91* 52*    Cardiac Enzymes: No results for input(s): CKTOTAL, CKMB, CKMBINDEX, TROPONINI in the last 168 hours.  BNP: Invalid input(s): POCBNP  CBG: No results for input(s): GLUCAP in the last 168 hours.  Microbiology: Results for orders placed or performed during the hospital encounter of 06-20-2017  Culture, Urine     Status: Abnormal   Collection Time: 07/29/17 12:20 PM  Result Value Ref Range Status   Specimen Description URINE, RANDOM  Final   Special Requests NONE  Final   Culture (A)  Final    >=100,000 COLONIES/mL ESCHERICHIA COLI >=100,000  COLONIES/mL KLEBSIELLA PNEUMONIAE Confirmed Extended Spectrum Beta-Lactamase Producer (ESBL).  In bloodstream infections from ESBL organisms, carbapenems are preferred over piperacillin/tazobactam. They are shown to have a lower risk of mortality. Performed at [facility], [address] [place], [place] [address]    Report Status 08/01/2017 FINAL  Final   Organism ID, Bacteria ESCHERICHIA COLI (A)  Final   Organism ID, Bacteria KLEBSIELLA PNEUMONIAE (A)  Final      Susceptibility   Escherichia coli - MIC*    AMPICILLIN >=32 RESISTANT Resistant     CEFAZOLIN <=4 SENSITIVE Sensitive     CEFTRIAXONE <=1 SENSITIVE Sensitive     CIPROFLOXACIN <=0.25 SENSITIVE Sensitive     GENTAMICIN <=1 SENSITIVE Sensitive     IMIPENEM <=0.25 SENSITIVE Sensitive     NITROFURANTOIN <=16 SENSITIVE Sensitive     TRIMETH/SULFA >=320 RESISTANT Resistant     AMPICILLIN/SULBACTAM 16 INTERMEDIATE Intermediate     PIP/TAZO <=4 SENSITIVE Sensitive     Extended ESBL NEGATIVE Sensitive     * >=100,000 COLONIES/mL ESCHERICHIA COLI   Klebsiella pneumoniae - MIC*    AMPICILLIN >=32 RESISTANT Resistant     CEFAZOLIN >=64 RESISTANT Resistant     CEFTRIAXONE >=64 RESISTANT Resistant     CIPROFLOXACIN 2 INTERMEDIATE Intermediate     GENTAMICIN <=1  SENSITIVE Sensitive     IMIPENEM <=0.25 SENSITIVE Sensitive     NITROFURANTOIN 32 SENSITIVE Sensitive     TRIMETH/SULFA >=320 RESISTANT Resistant     AMPICILLIN/SULBACTAM >=32 RESISTANT Resistant     PIP/TAZO 32 INTERMEDIATE Intermediate     Extended ESBL POSITIVE Resistant     * >=100,000 COLONIES/mL KLEBSIELLA PNEUMONIAE  C difficile quick scan w PCR reflex     Status: Abnormal   Collection Time: 07/29/17 12:24 PM  Result Value Ref Range Status   C Diff antigen POSITIVE (A) NEGATIVE Final   C Diff toxin NEGATIVE NEGATIVE Final   C Diff interpretation Results are indeterminate. See PCR results.  Final    Comment: Performed at [facility], [address], [place], [place] [address]  C. Diff by PCR, Reflexed     Status: None   Collection Time: 07/29/17 12:24 PM  Result Value Ref Range Status   Toxigenic C. Difficile by PCR NEGATIVE NEGATIVE Final    Comment: Patient is colonized with non toxigenic C. difficile. May not need treatment unless significant symptoms are present. Performed at [facility], [address], [place], [place] [address]   Culture, blood (routine x 2)     Status: Abnormal   Collection Time: 07/29/17 12:37 PM  Result Value Ref Range Status   Specimen Description BLOOD LEFT ANTECUBITAL  Final   Special Requests   Final    BOTTLES DRAWN AEROBIC ONLY Blood Culture results may not be optimal due to an inadequate volume of blood received in culture bottles   Culture  Setup Time   Final    AEROBIC BOTTLE ONLY GRAM NEGATIVE RODS CRITICAL RESULT CALLED TO, READ BACK BY AND VERIFIED WITH: Hartley Barefoot RN 07/30/17 0139 JDW    Culture (A)  Final    ESCHERICHIA COLI KLEBSIELLA PNEUMONIAE Confirmed Extended Spectrum Beta-Lactamase Producer (ESBL).  In bloodstream infections from ESBL organisms, carbapenems are preferred over piperacillin/tazobactam. They are shown to have a lower risk of mortality. Performed at [facility], [address] [place], [place] [address]    Report Status 08/01/2017 FINAL  Final   Organism ID, Bacteria ESCHERICHIA COLI  Final   Organism ID, Bacteria KLEBSIELLA PNEUMONIAE  Final      Susceptibility   Escherichia coli - MIC*    AMPICILLIN >=32 RESISTANT Resistant     CEFAZOLIN <=4 SENSITIVE Sensitive     CEFEPIME <=1 SENSITIVE Sensitive     CEFTAZIDIME <=1 SENSITIVE Sensitive     CEFTRIAXONE <=1 SENSITIVE Sensitive     CIPROFLOXACIN <=0.25 SENSITIVE Sensitive     GENTAMICIN <=1 SENSITIVE Sensitive     IMIPENEM <=0.25 SENSITIVE Sensitive     TRIMETH/SULFA >=320 RESISTANT Resistant     AMPICILLIN/SULBACTAM 16 INTERMEDIATE Intermediate     PIP/TAZO <=4 SENSITIVE Sensitive     Extended ESBL  NEGATIVE Sensitive     * ESCHERICHIA COLI   Klebsiella pneumoniae - MIC*    AMPICILLIN >=32 RESISTANT Resistant     CEFAZOLIN >=64 RESISTANT Resistant     CEFEPIME 32 RESISTANT Resistant     CEFTAZIDIME RESISTANT Resistant     CEFTRIAXONE >=64 RESISTANT Resistant     CIPROFLOXACIN 2 INTERMEDIATE Intermediate     GENTAMICIN <=1 SENSITIVE Sensitive     IMIPENEM <=0.25 SENSITIVE Sensitive     TRIMETH/SULFA >=320 RESISTANT Resistant     AMPICILLIN/SULBACTAM >=32 RESISTANT Resistant     PIP/TAZO 32 INTERMEDIATE Intermediate     Extended ESBL POSITIVE  Resistant     * KLEBSIELLA PNEUMONIAE  Culture, blood (routine x 2)     Status: Abnormal   Collection Time: 07/29/17 12:37 PM  Result Value Ref Range Status   Specimen Description BLOOD LEFT ANTECUBITAL  Final   Special Requests   Final    BOTTLES DRAWN AEROBIC ONLY Blood Culture results may not be optimal due to an inadequate volume of blood received in culture bottles   Culture  Setup Time   Final    AEROBIC BOTTLE ONLY GRAM NEGATIVE RODS CRITICAL VALUE NOTED.  VALUE IS CONSISTENT WITH PREVIOUSLY REPORTED AND CALLED VALUE.    Culture (A)  Final    KLEBSIELLA PNEUMONIAE SUSCEPTIBILITIES PERFORMED ON PREVIOUS CULTURE WITHIN THE LAST 5 DAYS. Performed at [facility], [address], [place], [place] [address]    Report Status 08/01/2017 FINAL  Final  Blood Culture ID Panel (Reflexed)     Status: Abnormal   Collection Time: 07/29/17 12:37 PM  Result Value Ref Range Status   Enterococcus species NOT DETECTED NOT DETECTED Final   Listeria monocytogenes NOT DETECTED NOT DETECTED Final   Staphylococcus species NOT DETECTED NOT DETECTED Final   Staphylococcus aureus NOT DETECTED NOT DETECTED Final   Streptococcus species NOT DETECTED NOT DETECTED Final   Streptococcus agalactiae NOT DETECTED NOT DETECTED Final   Streptococcus pneumoniae NOT DETECTED NOT DETECTED Final   Streptococcus pyogenes NOT DETECTED NOT DETECTED Final    Acinetobacter baumannii NOT DETECTED NOT DETECTED Final   Enterobacteriaceae species DETECTED (A) NOT DETECTED Final    Comment: CRITICAL RESULT CALLED TO, READ BACK BY AND VERIFIED WITH: Hartley Barefoot RN 07/30/17 0136 JDW    Enterobacter cloacae complex NOT DETECTED NOT DETECTED Final   Escherichia coli DETECTED (A) NOT DETECTED Final    Comment: CRITICAL RESULT CALLED TO, READ BACK BY AND VERIFIED WITH: Hartley Barefoot RN 07/30/17 0136 JDW    Klebsiella oxytoca NOT DETECTED NOT DETECTED Final   Klebsiella pneumoniae DETECTED (A) NOT DETECTED Final    Comment: CRITICAL RESULT CALLED TO, READ BACK BY AND VERIFIED WITH: Hartley Barefoot RN 07/30/17 0136 JDW    Proteus species NOT DETECTED NOT DETECTED Final   Serratia marcescens NOT DETECTED NOT DETECTED Final   Carbapenem resistance NOT DETECTED NOT DETECTED Final   Haemophilus influenzae NOT DETECTED NOT DETECTED Final   Neisseria meningitidis NOT DETECTED NOT DETECTED Final   Pseudomonas aeruginosa NOT DETECTED NOT DETECTED Final   Candida albicans NOT DETECTED NOT DETECTED Final   Candida glabrata NOT DETECTED NOT DETECTED Final   Candida krusei NOT DETECTED NOT DETECTED Final   Candida parapsilosis NOT DETECTED NOT DETECTED Final   Candida tropicalis NOT DETECTED NOT DETECTED Final    Comment: Performed at [facility], [address], [place], [place] [address]  C difficile quick scan w PCR reflex     Status: None   Collection Time: 08/12/17  9:30 AM  Result Value Ref Range Status   C Diff antigen NEGATIVE NEGATIVE Final   C Diff toxin NEGATIVE NEGATIVE Final   C Diff interpretation No C. difficile detected.  Final  Culture, Urine     Status: None (Preliminary result)   Collection Time: 08/12/17  1:43 PM  Result Value Ref Range Status   Specimen Description URINE, RANDOM  Final   Special Requests NONE  Final   Culture   Final    CULTURE REINCUBATED FOR BETTER GROWTH Performed at [facility], [address],  [place],  [place] [address]    Report Status PENDING  Incomplete  Culture, blood (routine x 2)     Status: None (Preliminary result)   Collection Time: 08/12/17  3:00 PM  Result Value Ref Range Status   Specimen Description BLOOD LEFT FOREARM  Final   Special Requests   Final    BOTTLES DRAWN AEROBIC AND ANAEROBIC Blood Culture adequate volume   Culture   Final    NO GROWTH < 24 HOURS Performed at [facility], [address] [place], [place] [address]    Report Status PENDING  Incomplete  Culture, blood (routine x 2)     Status: None (Preliminary result)   Collection Time: 08/12/17  3:10 PM  Result Value Ref Range Status   Specimen Description BLOOD LEFT HAND  Final   Special Requests   Final    BOTTLES DRAWN AEROBIC AND ANAEROBIC Blood Culture results may not be optimal due to an inadequate volume of blood received in culture bottles   Culture   Final    NO GROWTH < 24 HOURS Performed at [facility], [address] [place], [place] [address]    Report Status PENDING  Incomplete    Coagulation Studies: No results for input(s): LABPROT, INR in the last 72 hours.  Urinalysis: No results for input(s): COLORURINE, LABSPEC, PHURINE, GLUCOSEU, HGBUR, BILIRUBINUR, KETONESUR, PROTEINUR, UROBILINOGEN, NITRITE, LEUKOCYTESUR in the last 72 hours.  Invalid input(s): APPERANCEUR    Imaging: No results found.   Medications:     lidocaine (PF)  Assessment/ Plan:  62 y.o. 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   1.  Severe acute  renal failure. 2.  Hyponatremia, Na 125 3.  Acute respiratory failure, currently on ventilator. 4.  Anemia unspecified, Hgb 7.3 5.  Severe protein calorie malnutrition with severe emaciation, albumin 1.6 6.  Hyperkalemia, corrected with HD, K 4.9 7.  Multiorganism sepsis.  8.  Hypotension.   Plan: We plan to continue dialysis 3 times per week.  Therefore he is due for hemodialysis today.  Ultrafiltration target has remained low at 0.5-1.  Urine output was only 800 cc over the preceding 24 hours.  After completing dialysis today we will plan for dialysis again on Monday.  Continue to monitor hemoglobin closely as it is low at 7.3 at the moment.  Consider blood transfusion for hemoglobin of 7 or less.  Prognosis quite guarded.   LOS: 0 Melvin Kelley 6/28/20198:06 AM

## 2017-08-15 LAB — TYPE AND SCREEN
ABO/RH(D): B POS
Antibody Screen: NEGATIVE
Unit division: 0

## 2017-08-15 LAB — MAGNESIUM: Magnesium: 1.5 mg/dL — ABNORMAL LOW (ref 1.7–2.4)

## 2017-08-15 LAB — BPAM RBC
BLOOD PRODUCT EXPIRATION DATE: 201907262359
ISSUE DATE / TIME: 201906282136
UNIT TYPE AND RH: 7300

## 2017-08-15 NOTE — Progress Notes (Signed)
Pulmonary Critical Care Medicine [facility] [place]   PULMONARY SERVICE  PROGRESS NOTE  Date of Service: 08/15/2017  Melvin Kelley  URK:[id number]  DOB: 1955-04-14   DOA: 18-Jun-2017  Referring Physician: Carron Curie, MD  HPI: Melvin Kelley is a 62 y.o. male seen for follow up of Acute on Chronic Respiratory Failure.  Patient is on full support right now is been on assist-control mode patient is not weanable from the ventilator secondary to multiple factors.  At this time is on assist-control mode on 28% FiO2  Medications: Reviewed on Rounds  Physical Exam:  Vitals:  Temperature is 98.7 degrees pulse 107 respiratory 29 blood pressure 110/65 saturations 94%  Ventilator Settings  Mode of ventilation assist-control FiO2 28% tidal volume 466cc peep 5  . General: Comfortable at this time . Eyes: Grossly normal lids, irises & conjunctiva . ENT: grossly tongue is normal . Neck: no obvious mass . Cardiovascular: S1 S2 normal no gallop . Respiratory:  Good air entry noted bilaterally . Abdomen: soft . Skin: no rash seen on limited exam . Musculoskeletal: not rigid . Psychiatric:unable to assess . Neurologic: no seizure no involuntary movements         Lab Data:   Basic Metabolic Panel: Recent Labs  Lab 08/09/17 1028 08/10/17 0500 08/12/17 0711 08/14/17 1600 08/15/17 0534  NA  --  130* 125* 132*  --   K  --  4.9 4.9 4.8  --   CL  --  97* 92* 97*  --   CO2  --  27 26 28   --   GLUCOSE  --  127* 195* 158*  --   BUN  --  52* 50* 41*  --   CREATININE  --  1.34* 1.80* 1.63*  --   CALCIUM  --  8.4* 8.1* 8.2*  --   MG 1.8  --  1.8  --  1.5*  PHOS  --  4.5 5.4* 5.3*  --     Liver Function Tests: Recent Labs  Lab 08/10/17 0500 08/12/17 0711 08/14/17 1600  ALBUMIN 1.7* 1.6* 1.6*   No results for input(s): LIPASE, AMYLASE in the last 168 hours. No results for input(s): AMMONIA in the last 168 hours.  CBC: Recent Labs  Lab 08/10/17 0500 08/12/17 0711  08/14/17 1600  WBC 10.4 9.1 7.4  HGB 7.1* 7.3* 6.1*  HCT 22.4* 22.8* 19.5*  MCV 86.2 88.0 89.9  PLT 91* 52* 58*    Cardiac Enzymes: No results for input(s): CKTOTAL, CKMB, CKMBINDEX, TROPONINI in the last 168 hours.  BNP (last 3 results) No results for input(s): BNP in the last 8760 hours.  ProBNP (last 3 results) No results for input(s): PROBNP in the last 8760 hours.  Radiological Exams: No results found.  Assessment/Plan Active Problems:   Cardiac arrest (HCC)   Acute on chronic respiratory failure (HCC)   CHF (congestive heart failure) (HCC)   Obstructive sleep apnea   Acute kidney injury (HCC)   1.  acute on chronic Respiratory failure hypoxia patient is doing fine at this time with current vent settings and this will be continued.  As noted above patient is currently not weanable continue with supportive care prognosis is guarded at this time. 2. Cardiac arrest rhythm has been stable  3. CHF prognosis is poor  4. Acute renal failure followed by Nephrology for dialysis we will continue to monitor 5. Sleep apnea non issue   I have personally seen and evaluated the patient, evaluated laboratory and imaging  results, formulated the assessment and plan and placed orders. The Patient requires high complexity decision making for assessment and support.  Case was discussed on Rounds with the Respiratory Therapy Staff  Allyne Gee, MD [facility] Pulmonary Critical Care Medicine Sleep Medicine

## 2017-08-16 LAB — CBC
HEMATOCRIT: 23.4 % — AB (ref 39.0–52.0)
Hemoglobin: 7.8 g/dL — ABNORMAL LOW (ref 13.0–17.0)
MCH: 30.1 pg (ref 26.0–34.0)
MCHC: 33.3 g/dL (ref 30.0–36.0)
MCV: 90.3 fL (ref 78.0–100.0)
PLATELETS: 56 10*3/uL — AB (ref 150–400)
RBC: 2.59 MIL/uL — ABNORMAL LOW (ref 4.22–5.81)
RDW: 20.9 % — AB (ref 11.5–15.5)
WBC: 13.2 10*3/uL — AB (ref 4.0–10.5)

## 2017-08-16 LAB — MAGNESIUM: MAGNESIUM: 2 mg/dL (ref 1.7–2.4)

## 2017-08-16 NOTE — Progress Notes (Signed)
Pulmonary Critical Care Medicine [facility] [place]   PULMONARY SERVICE  PROGRESS NOTE  Date of Service: 08/16/2017  Melvin Kelley  EXB:[id number]  DOB: 07/29/1955   DOA: 06/04/2017  Referring Physician: Carron Curie, MD  HPI: Melvin Kelley is a 62 y.o. male seen for follow up of Acute on Chronic Respiratory Failure.  Patient is comfortable at this time remains on full vent support patient is not able to wean and pressure support at all at this time  Medications: Reviewed on Rounds  Physical Exam:  Vitals: Temperature 97.0 pulse 93 respiratory rate 37 blood pressure 113/64 saturations 93%  Ventilator Settings mode of ventilation assist control FiO2 20% tidal volume 466 PEEP 5  . General: Comfortable at this time . Eyes: Grossly normal lids, irises & conjunctiva . ENT: grossly tongue is normal . Neck: no obvious mass . Cardiovascular: S1 S2 normal no gallop . Respiratory: Coarse breath sounds few scattered rhonchi . Abdomen: soft . Skin: no rash seen on limited exam . Musculoskeletal: not rigid . Psychiatric:unable to assess . Neurologic: no seizure no involuntary movements         Lab Data:   Basic Metabolic Panel: Recent Labs  Lab 08/10/17 0500 08/12/17 0711 08/14/17 1600 08/15/17 0534 08/16/17 1049  NA 130* 125* 132*  --   --   K 4.9 4.9 4.8  --   --   CL 97* 92* 97*  --   --   CO2 27 26 28   --   --   GLUCOSE 127* 195* 158*  --   --   BUN 52* 50* 41*  --   --   CREATININE 1.34* 1.80* 1.63*  --   --   CALCIUM 8.4* 8.1* 8.2*  --   --   MG  --  1.8  --  1.5* 2.0  PHOS 4.5 5.4* 5.3*  --   --     Liver Function Tests: Recent Labs  Lab 08/10/17 0500 08/12/17 0711 08/14/17 1600  ALBUMIN 1.7* 1.6* 1.6*   No results for input(s): LIPASE, AMYLASE in the last 168 hours. No results for input(s): AMMONIA in the last 168 hours.  CBC: Recent Labs  Lab 08/10/17 0500 08/12/17 0711 08/14/17 1600  WBC 10.4 9.1 7.4  HGB 7.1* 7.3* 6.1*  HCT  22.4* 22.8* 19.5*  MCV 86.2 88.0 89.9  PLT 91* 52* 58*    Cardiac Enzymes: No results for input(s): CKTOTAL, CKMB, CKMBINDEX, TROPONINI in the last 168 hours.  BNP (last 3 results) No results for input(s): BNP in the last 8760 hours.  ProBNP (last 3 results) No results for input(s): PROBNP in the last 8760 hours.  Radiological Exams: No results found.  Assessment/Plan Active Problems:   Cardiac arrest (HCC)   Acute on chronic respiratory failure (HCC)   CHF (congestive heart failure) (HCC)   Obstructive sleep apnea   Acute kidney injury (HCC)   1. Acute on chronic respiratory failure with hypoxia remains on full vent support has failed numerous attempts at weaning.  Patient will need ongoing ventilatory support prognosis is poor 2. Acute renal failure patient is dialysis sized and is followed by nephrology. 3. Sleep apnea currently nonissue 4. CHF we will continue with monitoring fluid status prognosis poor 5. Cardiac arrest rhythm seem to be stable thus far   I have personally seen and evaluated the patient, evaluated laboratory and imaging results, formulated the assessment and plan and placed orders. The Patient requires high complexity decision making for assessment and  support.  Case was discussed on Rounds with the Respiratory Therapy Staff  Allyne Gee, MD [facility] Pulmonary Critical Care Medicine Sleep Medicine

## 2017-08-17 LAB — RENAL FUNCTION PANEL
ALBUMIN: 1.7 g/dL — AB (ref 3.5–5.0)
Anion gap: 11 (ref 5–15)
BUN: 50 mg/dL — AB (ref 6–20)
CALCIUM: 8.2 mg/dL — AB (ref 8.9–10.3)
CO2: 22 mmol/L (ref 22–32)
CREATININE: 2.11 mg/dL — AB (ref 0.61–1.24)
Chloride: 93 mmol/L — ABNORMAL LOW (ref 98–111)
GFR calc Af Amer: 38 mL/min — ABNORMAL LOW (ref >60)
GFR, EST NON AFRICAN AMERICAN: 32 mL/min — AB (ref >60)
GLUCOSE: 135 mg/dL — AB (ref 70–99)
PHOSPHORUS: 6.8 mg/dL — AB (ref 2.5–4.6)
Potassium: 5.2 mmol/L — ABNORMAL HIGH (ref 3.5–5.1)
SODIUM: 126 mmol/L — AB (ref 135–145)

## 2017-08-17 LAB — CBC
HCT: 26.6 % — ABNORMAL LOW (ref 39.0–52.0)
HEMOGLOBIN: 8.4 g/dL — AB (ref 13.0–17.0)
MCH: 29 pg (ref 26.0–34.0)
MCHC: 31.6 g/dL (ref 30.0–36.0)
MCV: 91.7 fL (ref 78.0–100.0)
PLATELETS: 45 10*3/uL — AB (ref 150–400)
RBC: 2.9 MIL/uL — AB (ref 4.22–5.81)
RDW: 20.9 % — AB (ref 11.5–15.5)
WBC: 10.6 10*3/uL — AB (ref 4.0–10.5)

## 2017-08-17 LAB — CULTURE, BLOOD (ROUTINE X 2)
CULTURE: NO GROWTH
CULTURE: NO GROWTH
Special Requests: ADEQUATE

## 2017-08-17 NOTE — Progress Notes (Addendum)
Pulmonary Critical Care Medicine [facility] [place]   PULMONARY SERVICE  PROGRESS NOTE  Date of Service: 08/17/2017  Melvin Kelley  DHW:[id number]  DOB: 1955-12-25   DOA: 06/08/2017  Referring Physician: Carron Curie, MD  HPI: Melvin Kelley is a 62 y.o. male seen for follow up of Acute on Chronic Respiratory Failure.  Patient was getting dialyzed today remains on the ventilator full support blood pressures been somewhat tenuous  Medications: Reviewed on Rounds  Physical Exam:  Vitals: Temperature 97.5 pulse 93 respiratory 24 blood pressure 88/58 saturations 98%  Ventilator Settings mode of ventilation assist control FiO2 28% tidal volume 475 PEEP 5  . General: Comfortable at this time . Eyes: Grossly normal lids, irises & conjunctiva . ENT: grossly tongue is normal . Neck: no obvious mass . Cardiovascular: S1 S2 normal no gallop . Respiratory: No rhonchi expansion is equal . Abdomen: soft . Skin: no rash seen on limited exam . Musculoskeletal: not rigid . Psychiatric:unable to assess . Neurologic: no seizure no involuntary movements         Lab Data:   Basic Metabolic Panel: Recent Labs  Lab 08/12/17 0711 08/14/17 1600 08/15/17 0534 08/16/17 1049 08/17/17 0637  NA 125* 132*  --   --  126*  K 4.9 4.8  --   --  5.2*  CL 92* 97*  --   --  93*  CO2 26 28  --   --  22  GLUCOSE 195* 158*  --   --  135*  BUN 50* 41*  --   --  50*  CREATININE 1.80* 1.63*  --   --  2.11*  CALCIUM 8.1* 8.2*  --   --  8.2*  MG 1.8  --  1.5* 2.0  --   PHOS 5.4* 5.3*  --   --  6.8*    Liver Function Tests: Recent Labs  Lab 08/12/17 0711 08/14/17 1600 08/17/17 0637  ALBUMIN 1.6* 1.6* 1.7*   No results for input(s): LIPASE, AMYLASE in the last 168 hours. No results for input(s): AMMONIA in the last 168 hours.  CBC: Recent Labs  Lab 08/12/17 0711 08/14/17 1600 08/16/17 2025 08/17/17 0637  WBC 9.1 7.4 13.2* 10.6*  HGB 7.3* 6.1* 7.8* 8.4*  HCT 22.8* 19.5*  23.4* 26.6*  MCV 88.0 89.9 90.3 91.7  PLT 52* 58* 56* 45*    Cardiac Enzymes: No results for input(s): CKTOTAL, CKMB, CKMBINDEX, TROPONINI in the last 168 hours.  BNP (last 3 results) No results for input(s): BNP in the last 8760 hours.  ProBNP (last 3 results) No results for input(s): PROBNP in the last 8760 hours.  Radiological Exams: No results found.  Assessment/Plan Active Problems:   Cardiac arrest (HCC)   Acute on chronic respiratory failure (HCC)   CHF (congestive heart failure) (HCC)   Obstructive sleep apnea   Acute kidney injury (HCC)   1. Acute on chronic respiratory failure with hypoxia we will continue with full support on the ventilator.  Patient is not amenable. 2. Congestive heart failure at baseline we will continue supportive care 3. Acute renal failure on dialysis continue with present management 4. Sleep apnea nonissue 5. Cardiac arrest rhythm has been stable but blood pressure is tenuous   I have personally seen and evaluated the patient, evaluated laboratory and imaging results, formulated the assessment and plan and placed orders. The Patient requires high complexity decision making for assessment and support.  Case was discussed on Rounds with the Respiratory Therapy Staff  Allyne Gee, MD [facility] Pulmonary Critical Care Medicine Sleep Medicine

## 2017-08-17 NOTE — Progress Notes (Signed)
[facility]  ROUNDING NOTE   Subjective:  Patient seen at bedside. He remains on the ventilator at this time. Due for dialysis today.  Objective:  Vital signs in last 24 hours:  Temperature 97.5 pulse 95 respirations 24 blood pressure 127/80  Physical Exam: General: Critically ill appearing  Head: Severe temporal wasting, NG in place  Eyes: Anicteric  Neck: Tracheostomy in place  Lungs:  Bilateral rhonchi, vent assisted  Heart: S1S2 no rubs  Abdomen:  Soft, nontender, bowel sounds present  Extremities: 1+ peripheral edema.  Neurologic: Awake, not following commands  Skin: Ulcers on feet noted  Access: L IJ temporary dialysis catheter    Basic Metabolic Panel: Recent Labs  Lab 08/12/17 0711 08/14/17 1600 08/15/17 0534 08/16/17 1049  NA 125* 132*  --   --   K 4.9 4.8  --   --   CL 92* 97*  --   --   CO2 26 28  --   --   GLUCOSE 195* 158*  --   --   BUN 50* 41*  --   --   CREATININE 1.80* 1.63*  --   --   CALCIUM 8.1* 8.2*  --   --   MG 1.8  --  1.5* 2.0  PHOS 5.4* 5.3*  --   --     Liver Function Tests: Recent Labs  Lab 08/12/17 0711 08/14/17 1600  ALBUMIN 1.6* 1.6*   No results for input(s): LIPASE, AMYLASE in the last 168 hours. No results for input(s): AMMONIA in the last 168 hours.  CBC: Recent Labs  Lab 08/12/17 0711 08/14/17 1600 08/16/17 2025 08/17/17 0637  WBC 9.1 7.4 13.2* 10.6*  HGB 7.3* 6.1* 7.8* 8.4*  HCT 22.8* 19.5* 23.4* 26.6*  MCV 88.0 89.9 90.3 91.7  PLT 52* 58* 56* 45*    Cardiac Enzymes: No results for input(s): CKTOTAL, CKMB, CKMBINDEX, TROPONINI in the last 168 hours.  BNP: Invalid input(s): POCBNP  CBG: No results for input(s): GLUCAP in the last 168 hours.  Microbiology: Results for orders placed or performed during the hospital encounter of 06/05/2017  Culture, Urine     Status: Abnormal   Collection Time: 07/29/17 12:20 PM  Result Value Ref Range Status   Specimen Description URINE, RANDOM  Final   Special Requests NONE  Final   Culture (A)  Final    >=100,000 COLONIES/mL ESCHERICHIA COLI >=100,000 COLONIES/mL KLEBSIELLA PNEUMONIAE Confirmed Extended Spectrum Beta-Lactamase Producer (ESBL).  In bloodstream infections from ESBL organisms, carbapenems are preferred over piperacillin/tazobactam. They are shown to have a lower risk of mortality. Performed at [facility], [address] [place], [place] [address]    Report Status 08/01/2017 FINAL  Final   Organism ID, Bacteria ESCHERICHIA COLI (A)  Final   Organism ID, Bacteria KLEBSIELLA PNEUMONIAE (A)  Final      Susceptibility   Escherichia coli - MIC*    AMPICILLIN >=32 RESISTANT Resistant     CEFAZOLIN <=4 SENSITIVE Sensitive     CEFTRIAXONE <=1 SENSITIVE Sensitive     CIPROFLOXACIN <=0.25 SENSITIVE Sensitive     GENTAMICIN <=1 SENSITIVE Sensitive     IMIPENEM <=0.25 SENSITIVE Sensitive     NITROFURANTOIN <=16 SENSITIVE Sensitive     TRIMETH/SULFA >=320 RESISTANT Resistant     AMPICILLIN/SULBACTAM 16 INTERMEDIATE Intermediate     PIP/TAZO <=4 SENSITIVE Sensitive     Extended ESBL NEGATIVE Sensitive     * >=100,000 COLONIES/mL ESCHERICHIA COLI   Klebsiella pneumoniae - MIC*  AMPICILLIN >=32 RESISTANT Resistant     CEFAZOLIN >=64 RESISTANT Resistant     CEFTRIAXONE >=64 RESISTANT Resistant     CIPROFLOXACIN 2 INTERMEDIATE Intermediate     GENTAMICIN <=1 SENSITIVE Sensitive     IMIPENEM <=0.25 SENSITIVE Sensitive     NITROFURANTOIN 32 SENSITIVE Sensitive     TRIMETH/SULFA >=320 RESISTANT Resistant     AMPICILLIN/SULBACTAM >=32 RESISTANT Resistant     PIP/TAZO 32 INTERMEDIATE Intermediate     Extended ESBL POSITIVE Resistant     * >=100,000 COLONIES/mL KLEBSIELLA PNEUMONIAE  C difficile quick scan w PCR reflex     Status: Abnormal   Collection Time: 07/29/17 12:24 PM  Result Value Ref Range Status   C Diff antigen POSITIVE (A) NEGATIVE Final   C Diff toxin NEGATIVE NEGATIVE Final   C Diff interpretation Results  are indeterminate. See PCR results.  Final    Comment: Performed at [facility], [address] [place], [place] [address]  C. Diff by PCR, Reflexed     Status: None   Collection Time: 07/29/17 12:24 PM  Result Value Ref Range Status   Toxigenic C. Difficile by PCR NEGATIVE NEGATIVE Final    Comment: Patient is colonized with non toxigenic C. difficile. May not need treatment unless significant symptoms are present. Performed at [facility], [address], [place], [place] [address]   Culture, blood (routine x 2)     Status: Abnormal   Collection Time: 07/29/17 12:37 PM  Result Value Ref Range Status   Specimen Description BLOOD LEFT ANTECUBITAL  Final   Special Requests   Final    BOTTLES DRAWN AEROBIC ONLY Blood Culture results may not be optimal due to an inadequate volume of blood received in culture bottles   Culture  Setup Time   Final    AEROBIC BOTTLE ONLY GRAM NEGATIVE RODS CRITICAL RESULT CALLED TO, READ BACK BY AND VERIFIED WITH: Hartley Barefoot RN 07/30/17 0139 JDW    Culture (A)  Final    ESCHERICHIA COLI KLEBSIELLA PNEUMONIAE Confirmed Extended Spectrum Beta-Lactamase Producer (ESBL).  In bloodstream infections from ESBL organisms, carbapenems are preferred over piperacillin/tazobactam. They are shown to have a lower risk of mortality. Performed at [facility], [address] [place], [place] [address]    Report Status 08/01/2017 FINAL  Final   Organism ID, Bacteria ESCHERICHIA COLI  Final   Organism ID, Bacteria KLEBSIELLA PNEUMONIAE  Final      Susceptibility   Escherichia coli - MIC*    AMPICILLIN >=32 RESISTANT Resistant     CEFAZOLIN <=4 SENSITIVE Sensitive     CEFEPIME <=1 SENSITIVE Sensitive     CEFTAZIDIME <=1 SENSITIVE Sensitive     CEFTRIAXONE <=1 SENSITIVE Sensitive     CIPROFLOXACIN <=0.25 SENSITIVE Sensitive     GENTAMICIN <=1 SENSITIVE Sensitive     IMIPENEM <=0.25 SENSITIVE Sensitive     TRIMETH/SULFA >=320 RESISTANT Resistant      AMPICILLIN/SULBACTAM 16 INTERMEDIATE Intermediate     PIP/TAZO <=4 SENSITIVE Sensitive     Extended ESBL NEGATIVE Sensitive     * ESCHERICHIA COLI   Klebsiella pneumoniae - MIC*    AMPICILLIN >=32 RESISTANT Resistant     CEFAZOLIN >=64 RESISTANT Resistant     CEFEPIME 32 RESISTANT Resistant     CEFTAZIDIME RESISTANT Resistant     CEFTRIAXONE >=64 RESISTANT Resistant     CIPROFLOXACIN 2 INTERMEDIATE Intermediate     GENTAMICIN <=1 SENSITIVE Sensitive     IMIPENEM <=  0.25 SENSITIVE Sensitive     TRIMETH/SULFA >=320 RESISTANT Resistant     AMPICILLIN/SULBACTAM >=32 RESISTANT Resistant     PIP/TAZO 32 INTERMEDIATE Intermediate     Extended ESBL POSITIVE Resistant     * KLEBSIELLA PNEUMONIAE  Culture, blood (routine x 2)     Status: Abnormal   Collection Time: 07/29/17 12:37 PM  Result Value Ref Range Status   Specimen Description BLOOD LEFT ANTECUBITAL  Final   Special Requests   Final    BOTTLES DRAWN AEROBIC ONLY Blood Culture results may not be optimal due to an inadequate volume of blood received in culture bottles   Culture  Setup Time   Final    AEROBIC BOTTLE ONLY GRAM NEGATIVE RODS CRITICAL VALUE NOTED.  VALUE IS CONSISTENT WITH PREVIOUSLY REPORTED AND CALLED VALUE.    Culture (A)  Final    KLEBSIELLA PNEUMONIAE SUSCEPTIBILITIES PERFORMED ON PREVIOUS CULTURE WITHIN THE LAST 5 DAYS. Performed at [facility], [address], [place], [place] [address]    Report Status 08/01/2017 FINAL  Final  Blood Culture ID Panel (Reflexed)     Status: Abnormal   Collection Time: 07/29/17 12:37 PM  Result Value Ref Range Status   Enterococcus species NOT DETECTED NOT DETECTED Final   Listeria monocytogenes NOT DETECTED NOT DETECTED Final   Staphylococcus species NOT DETECTED NOT DETECTED Final   Staphylococcus aureus NOT DETECTED NOT DETECTED Final   Streptococcus species NOT DETECTED NOT DETECTED Final   Streptococcus agalactiae NOT DETECTED NOT DETECTED Final   Streptococcus  pneumoniae NOT DETECTED NOT DETECTED Final   Streptococcus pyogenes NOT DETECTED NOT DETECTED Final   Acinetobacter baumannii NOT DETECTED NOT DETECTED Final   Enterobacteriaceae species DETECTED (A) NOT DETECTED Final    Comment: CRITICAL RESULT CALLED TO, READ BACK BY AND VERIFIED WITH: Hartley Barefoot RN 07/30/17 0136 JDW    Enterobacter cloacae complex NOT DETECTED NOT DETECTED Final   Escherichia coli DETECTED (A) NOT DETECTED Final    Comment: CRITICAL RESULT CALLED TO, READ BACK BY AND VERIFIED WITH: Hartley Barefoot RN 07/30/17 0136 JDW    Klebsiella oxytoca NOT DETECTED NOT DETECTED Final   Klebsiella pneumoniae DETECTED (A) NOT DETECTED Final    Comment: CRITICAL RESULT CALLED TO, READ BACK BY AND VERIFIED WITH: Hartley Barefoot RN 07/30/17 0136 JDW    Proteus species NOT DETECTED NOT DETECTED Final   Serratia marcescens NOT DETECTED NOT DETECTED Final   Carbapenem resistance NOT DETECTED NOT DETECTED Final   Haemophilus influenzae NOT DETECTED NOT DETECTED Final   Neisseria meningitidis NOT DETECTED NOT DETECTED Final   Pseudomonas aeruginosa NOT DETECTED NOT DETECTED Final   Candida albicans NOT DETECTED NOT DETECTED Final   Candida glabrata NOT DETECTED NOT DETECTED Final   Candida krusei NOT DETECTED NOT DETECTED Final   Candida parapsilosis NOT DETECTED NOT DETECTED Final   Candida tropicalis NOT DETECTED NOT DETECTED Final    Comment: Performed at [facility], [address], [place], [place] [address]  C difficile quick scan w PCR reflex     Status: None   Collection Time: 08/12/17  9:30 AM  Result Value Ref Range Status   C Diff antigen NEGATIVE NEGATIVE Final   C Diff toxin NEGATIVE NEGATIVE Final   C Diff interpretation No C. difficile detected.  Final  Culture, Urine     Status: Abnormal   Collection Time: 08/12/17  1:43 PM  Result Value Ref Range Status   Specimen Description URINE, RANDOM  Final   Special Requests   Final    NONE Performed at [facility]  Lab, [address], [place], [place] [address]    Culture >=100,000 COLONIES/mL KLEBSIELLA PNEUMONIAE (A)  Final   Report Status 08/14/2017 FINAL  Final   Organism ID, Bacteria KLEBSIELLA PNEUMONIAE (A)  Final      Susceptibility   Klebsiella pneumoniae - MIC*    AMPICILLIN >=32 RESISTANT Resistant     CEFAZOLIN >=64 RESISTANT Resistant     CEFTRIAXONE >=64 RESISTANT Resistant     CIPROFLOXACIN >=4 RESISTANT Resistant     GENTAMICIN 2 SENSITIVE Sensitive     IMIPENEM 0.5 SENSITIVE Sensitive     NITROFURANTOIN 256 RESISTANT Resistant     TRIMETH/SULFA >=320 RESISTANT Resistant     AMPICILLIN/SULBACTAM >=32 RESISTANT Resistant     PIP/TAZO >=128 RESISTANT Resistant     Extended ESBL POSITIVE Resistant     * >=100,000 COLONIES/mL KLEBSIELLA PNEUMONIAE  Culture, blood (routine x 2)     Status: None (Preliminary result)   Collection Time: 08/12/17  3:00 PM  Result Value Ref Range Status   Specimen Description BLOOD LEFT FOREARM  Final   Special Requests   Final    BOTTLES DRAWN AEROBIC AND ANAEROBIC Blood Culture adequate volume   Culture   Final    NO GROWTH 4 DAYS Performed at [facility], [address], [place], [place] [address]    Report Status PENDING  Incomplete  Culture, blood (routine x 2)     Status: None (Preliminary result)   Collection Time: 08/12/17  3:10 PM  Result Value Ref Range Status   Specimen Description BLOOD LEFT HAND  Final   Special Requests   Final    BOTTLES DRAWN AEROBIC AND ANAEROBIC Blood Culture results may not be optimal due to an inadequate volume of blood received in culture bottles   Culture   Final    NO GROWTH 4 DAYS Performed at [facility], [address] [place], [place] [address]    Report Status PENDING  Incomplete    Coagulation Studies: No results for input(s): LABPROT, INR in the last 72 hours.  Urinalysis: No results for input(s): COLORURINE, LABSPEC, PHURINE, GLUCOSEU, HGBUR, BILIRUBINUR, KETONESUR, PROTEINUR,  UROBILINOGEN, NITRITE, LEUKOCYTESUR in the last 72 hours.  Invalid input(s): APPERANCEUR    Imaging: No results found.   Medications:     lidocaine (PF)  Assessment/ Plan:  62 y.o. 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   1.  Severe acute renal failure. 2.  Hyponatremia, Na 132 3.  Acute respiratory failure, currently on ventilator. 4.  Anemia unspecified, Hgb up to 8.4 5.  Severe protein calorie malnutrition with severe emaciation, albumin 1.6 6.  Hyperkalemia, corrected with HD, K 4.8 7.  Multiorganism sepsis.  8.  Hypotension.   Plan: The patient is due for hemodialysis today.  Orders have been prepared.  Thereafter next dialysis will be on Wednesday.  In regards to his anemia patient was transfused last week.  Hemoglobin currently up to 8.4.  Respiratory failure persists and the patient remains on the ventilator at this time.  Weaning as  per pulmonary/critical care.  We will continue to monitor.   LOS: 0 Alyxis Grippi 7/1/20198:18 AM

## 2017-08-18 LAB — PROTIME-INR
INR: 1.42
PROTHROMBIN TIME: 17.2 s — AB (ref 11.4–15.2)

## 2017-08-18 LAB — CBC
HCT: 22.4 % — ABNORMAL LOW (ref 39.0–52.0)
Hemoglobin: 7.1 g/dL — ABNORMAL LOW (ref 13.0–17.0)
MCH: 29.8 pg (ref 26.0–34.0)
MCHC: 31.7 g/dL (ref 30.0–36.0)
MCV: 94.1 fL (ref 78.0–100.0)
Platelets: 24 10*3/uL — CL (ref 150–400)
RBC: 2.38 MIL/uL — AB (ref 4.22–5.81)
RDW: 20.8 % — ABNORMAL HIGH (ref 11.5–15.5)
WBC: 7.9 10*3/uL (ref 4.0–10.5)

## 2017-08-18 LAB — OCCULT BLOOD X 1 CARD TO LAB, STOOL: Fecal Occult Bld: POSITIVE — AB

## 2017-08-18 LAB — MAGNESIUM: Magnesium: 1.7 mg/dL (ref 1.7–2.4)

## 2017-08-18 LAB — APTT: aPTT: 68 seconds — ABNORMAL HIGH (ref 24–36)

## 2017-08-18 NOTE — Progress Notes (Signed)
Pulmonary Critical Care Medicine [facility]   PULMONARY SERVICE  PROGRESS NOTE  Date of Service: 08/18/2017  Melvin Kelley  GYJ:[id number]  DOB: [birth year]   DOA: 05/31/2017  Referring Physician: Carron Curie, MD  HPI: Melvin Kelley is a 62 y.o. male seen for follow up of Acute on Chronic Respiratory Failure.  Patient is on full vent support.  Not able to wean.  Continues on dialysis has been having issues with bleeding GI bleed  Medications: Reviewed on Rounds  Physical Exam:  Vitals:  Temperature 97.6 degrees pulse 88 respiratory rate 17 blood pressure 125/82 saturations 98%  Ventilator Settings  Mode of ventilation assist-control FiO2 28% tidal volume 450 peep 5  . General: Comfortable at this time . Eyes: Grossly normal lids, irises & conjunctiva . ENT: grossly tongue is normal . Neck: no obvious mass . Cardiovascular: S1 S2 normal no gallop . Respiratory:  No rhonchi or rales are noted at this time . Abdomen: soft . Skin: no rash seen on limited exam . Musculoskeletal: not rigid . Psychiatric:unable to assess . Neurologic: no seizure no involuntary movements         Lab Data:   Basic Metabolic Panel: Recent Labs  Lab 08/12/17 0711 08/14/17 1600 08/15/17 0534 08/16/17 1049 08/17/17 0637 08/17/17 2358  NA 125* 132*  --   --  126*  --   K 4.9 4.8  --   --  5.2*  --   CL 92* 97*  --   --  93*  --   CO2 26 28  --   --  22  --   GLUCOSE 195* 158*  --   --  135*  --   BUN 50* 41*  --   --  50*  --   CREATININE 1.80* 1.63*  --   --  2.11*  --   CALCIUM 8.1* 8.2*  --   --  8.2*  --   MG 1.8  --  1.5* 2.0  --  1.7  PHOS 5.4* 5.3*  --   --  6.8*  --     Liver Function Tests: Recent Labs  Lab 08/12/17 0711 08/14/17 1600 08/17/17 0637  ALBUMIN 1.6* 1.6* 1.7*   No results for input(s): LIPASE, AMYLASE in the last 168 hours. No results for input(s): AMMONIA in the last 168 hours.  CBC: Recent Labs  Lab 08/12/17 0711 08/14/17 1600  08/16/17 2025 08/17/17 0637 08/18/17 1127  WBC 9.1 7.4 13.2* 10.6* 7.9  HGB 7.3* 6.1* 7.8* 8.4* 7.1*  HCT 22.8* 19.5* 23.4* 26.6* 22.4*  MCV 88.0 89.9 90.3 91.7 94.1  PLT 52* 58* 56* 45* 24*    Cardiac Enzymes: No results for input(s): CKTOTAL, CKMB, CKMBINDEX, TROPONINI in the last 168 hours.  BNP (last 3 results) No results for input(s): BNP in the last 8760 hours.  ProBNP (last 3 results) No results for input(s): PROBNP in the last 8760 hours.  Radiological Exams: No results found.  Assessment/Plan Active Problems:   Cardiac arrest (HCC)   Acute on chronic respiratory failure (HCC)   CHF (congestive heart failure) (HCC)   Obstructive sleep apnea   Acute kidney injury (HCC)   1.  acute on chronic Respiratory failure with hypoxia continue with full supportive care.  Continue assist-control mode on 28% FiO2.  Weaning attempts a failed numerous times.   2. Congestive heart failure we will continue present management stable at this time  3. Sleep apnea grossly unchanged  4. Acute renal  failure on dialysis 5. Cardiac arrest rhythm is been stable but his overall status is tenuous   I have personally seen and evaluated the patient, evaluated laboratory and imaging results, formulated the assessment and plan and placed orders. The Patient requires high complexity decision making for assessment and support.  Case was discussed on Rounds with the Respiratory Therapy Staff  Yevonne Pax, MD [facility] Pulmonary Critical Care Medicine Sleep Medicine

## 2017-08-19 LAB — RENAL FUNCTION PANEL
ANION GAP: 6 (ref 5–15)
Albumin: 1.6 g/dL — ABNORMAL LOW (ref 3.5–5.0)
BUN: 44 mg/dL — ABNORMAL HIGH (ref 6–20)
CALCIUM: 7.5 mg/dL — AB (ref 8.9–10.3)
CO2: 27 mmol/L (ref 22–32)
Chloride: 93 mmol/L — ABNORMAL LOW (ref 98–111)
Creatinine, Ser: 1.87 mg/dL — ABNORMAL HIGH (ref 0.61–1.24)
GFR calc non Af Amer: 37 mL/min — ABNORMAL LOW (ref >60)
GFR, EST AFRICAN AMERICAN: 43 mL/min — AB (ref >60)
Glucose, Bld: 193 mg/dL — ABNORMAL HIGH (ref 70–99)
Phosphorus: 5.9 mg/dL — ABNORMAL HIGH (ref 2.5–4.6)
Potassium: 4.8 mmol/L (ref 3.5–5.1)
SODIUM: 126 mmol/L — AB (ref 135–145)

## 2017-08-19 LAB — CBC
HCT: 22.2 % — ABNORMAL LOW (ref 39.0–52.0)
HEMATOCRIT: 21.9 % — AB (ref 39.0–52.0)
HEMOGLOBIN: 7.1 g/dL — AB (ref 13.0–17.0)
HEMOGLOBIN: 7.1 g/dL — AB (ref 13.0–17.0)
MCH: 29.8 pg (ref 26.0–34.0)
MCH: 29.3 pg (ref 26.0–34.0)
MCHC: 32.4 g/dL (ref 30.0–36.0)
MCHC: 32 g/dL (ref 30.0–36.0)
MCV: 92 fL (ref 78.0–100.0)
MCV: 91.7 fL (ref 78.0–100.0)
Platelets: 51 10*3/uL — ABNORMAL LOW (ref 150–400)
Platelets: 44 10*3/uL — ABNORMAL LOW (ref 150–400)
RBC: 2.38 MIL/uL — AB (ref 4.22–5.81)
RBC: 2.42 MIL/uL — AB (ref 4.22–5.81)
RDW: 20.7 % — ABNORMAL HIGH (ref 11.5–15.5)
RDW: 20.6 % — ABNORMAL HIGH (ref 11.5–15.5)
WBC: 11 10*3/uL — ABNORMAL HIGH (ref 4.0–10.5)
WBC: 11.4 10*3/uL — ABNORMAL HIGH (ref 4.0–10.5)

## 2017-08-19 LAB — BPAM PLATELET PHERESIS
BLOOD PRODUCT EXPIRATION DATE: 201907042359
Blood Product Expiration Date: 201907032359
Blood Product Expiration Date: 201907042359
ISSUE DATE / TIME: 201907021345
ISSUE DATE / TIME: 201907021613
UNIT TYPE AND RH: 6200
UNIT TYPE AND RH: 7300
Unit Type and Rh: 7300

## 2017-08-19 LAB — PREPARE PLATELET PHERESIS
UNIT DIVISION: 0
UNIT DIVISION: 0
Unit division: 0

## 2017-08-19 NOTE — Progress Notes (Signed)
[facility]  ROUNDING NOTE   Subjective:  Patient remains critically ill at the moment. Urine output was only 300 cc over the preceding 24 hours. Due for dialysis today.  Objective:  Vital signs in last 24 hours:  Temperature 97.4 pulse 99 respirations 26 blood pressure 91/77  Physical Exam: General: Critically ill appearing  Head: Severe temporal wasting, NG in place  Eyes: Anicteric  Neck: Tracheostomy in place  Lungs:  Bilateral rhonchi, vent assisted  Heart: S1S2 no rubs  Abdomen:  Soft, nontender, bowel sounds present  Extremities: 1+ peripheral edema.  Neurologic: Awake, not following commands  Skin: Ulcers on feet noted  Access: L IJ temporary dialysis catheter    Basic Metabolic Panel: Recent Labs  Lab 08/14/17 1600 08/15/17 0534 08/16/17 1049 08/17/17 0637 08/17/17 2358 08/19/17 0647  NA 132*  --   --  126*  --  126*  K 4.8  --   --  5.2*  --  4.8  CL 97*  --   --  93*  --  93*  CO2 28  --   --  22  --  27  GLUCOSE 158*  --   --  135*  --  193*  BUN 41*  --   --  50*  --  44*  CREATININE 1.63*  --   --  2.11*  --  1.87*  CALCIUM 8.2*  --   --  8.2*  --  7.5*  MG  --  1.5* 2.0  --  1.7  --   PHOS 5.3*  --   --  6.8*  --  5.9*    Liver Function Tests: Recent Labs  Lab 08/14/17 1600 08/17/17 0637 08/19/17 0647  ALBUMIN 1.6* 1.7* 1.6*   No results for input(s): LIPASE, AMYLASE in the last 168 hours. No results for input(s): AMMONIA in the last 168 hours.  CBC: Recent Labs  Lab 08/14/17 1600 08/16/17 2025 08/17/17 0637 08/18/17 1127 08/19/17 0647  WBC 7.4 13.2* 10.6* 7.9 11.0*  HGB 6.1* 7.8* 8.4* 7.1* 7.1*  HCT 19.5* 23.4* 26.6* 22.4* 21.9*  MCV 89.9 90.3 91.7 94.1 92.0  PLT 58* 56* 45* 24* 51*    Cardiac Enzymes: No results for input(s): CKTOTAL, CKMB, CKMBINDEX, TROPONINI in the last 168 hours.  BNP: Invalid input(s): POCBNP  CBG: No results for input(s): GLUCAP in the last 168 hours.  Microbiology: Results for  orders placed or performed during the hospital encounter of 06-27-17  Culture, Urine     Status: Abnormal   Collection Time: 07/29/17 12:20 PM  Result Value Ref Range Status   Specimen Description URINE, RANDOM  Final   Special Requests NONE  Final   Culture (A)  Final    >=100,000 COLONIES/mL ESCHERICHIA COLI >=100,000 COLONIES/mL KLEBSIELLA PNEUMONIAE Confirmed Extended Spectrum Beta-Lactamase Producer (ESBL).  In bloodstream infections from ESBL organisms, carbapenems are preferred over piperacillin/tazobactam. They are shown to have a lower risk of mortality. Performed at [facility], [address], [place], [place] [address]    Report Status 08/01/2017 FINAL  Final   Organism ID, Bacteria ESCHERICHIA COLI (A)  Final   Organism ID, Bacteria KLEBSIELLA PNEUMONIAE (A)  Final      Susceptibility   Escherichia coli - MIC*    AMPICILLIN >=32 RESISTANT Resistant     CEFAZOLIN <=4 SENSITIVE Sensitive     CEFTRIAXONE <=1 SENSITIVE Sensitive     CIPROFLOXACIN <=0.25 SENSITIVE Sensitive     GENTAMICIN <=1 SENSITIVE Sensitive  IMIPENEM <=0.25 SENSITIVE Sensitive     NITROFURANTOIN <=16 SENSITIVE Sensitive     TRIMETH/SULFA >=320 RESISTANT Resistant     AMPICILLIN/SULBACTAM 16 INTERMEDIATE Intermediate     PIP/TAZO <=4 SENSITIVE Sensitive     Extended ESBL NEGATIVE Sensitive     * >=100,000 COLONIES/mL ESCHERICHIA COLI   Klebsiella pneumoniae - MIC*    AMPICILLIN >=32 RESISTANT Resistant     CEFAZOLIN >=64 RESISTANT Resistant     CEFTRIAXONE >=64 RESISTANT Resistant     CIPROFLOXACIN 2 INTERMEDIATE Intermediate     GENTAMICIN <=1 SENSITIVE Sensitive     IMIPENEM <=0.25 SENSITIVE Sensitive     NITROFURANTOIN 32 SENSITIVE Sensitive     TRIMETH/SULFA >=320 RESISTANT Resistant     AMPICILLIN/SULBACTAM >=32 RESISTANT Resistant     PIP/TAZO 32 INTERMEDIATE Intermediate     Extended ESBL POSITIVE Resistant     * >=100,000 COLONIES/mL KLEBSIELLA PNEUMONIAE  C difficile quick scan  w PCR reflex     Status: Abnormal   Collection Time: 07/29/17 12:24 PM  Result Value Ref Range Status   C Diff antigen POSITIVE (A) NEGATIVE Final   C Diff toxin NEGATIVE NEGATIVE Final   C Diff interpretation Results are indeterminate. See PCR results.  Final    Comment: Performed at [facility], [address], [place], [place] [address]  C. Diff by PCR, Reflexed     Status: None   Collection Time: 07/29/17 12:24 PM  Result Value Ref Range Status   Toxigenic C. Difficile by PCR NEGATIVE NEGATIVE Final    Comment: Patient is colonized with non toxigenic C. difficile. May not need treatment unless significant symptoms are present. Performed at [facility], [address] [place], [place] [address]   Culture, blood (routine x 2)     Status: Abnormal   Collection Time: 07/29/17 12:37 PM  Result Value Ref Range Status   Specimen Description BLOOD LEFT ANTECUBITAL  Final   Special Requests   Final    BOTTLES DRAWN AEROBIC ONLY Blood Culture results may not be optimal due to an inadequate volume of blood received in culture bottles   Culture  Setup Time   Final    AEROBIC BOTTLE ONLY GRAM NEGATIVE RODS CRITICAL RESULT CALLED TO, READ BACK BY AND VERIFIED WITH: Hartley Barefoot RN 07/30/17 0139 JDW    Culture (A)  Final    ESCHERICHIA COLI KLEBSIELLA PNEUMONIAE Confirmed Extended Spectrum Beta-Lactamase Producer (ESBL).  In bloodstream infections from ESBL organisms, carbapenems are preferred over piperacillin/tazobactam. They are shown to have a lower risk of mortality. Performed at [facility], [address], [place], [place] [address]    Report Status 08/01/2017 FINAL  Final   Organism ID, Bacteria ESCHERICHIA COLI  Final   Organism ID, Bacteria KLEBSIELLA PNEUMONIAE  Final      Susceptibility   Escherichia coli - MIC*    AMPICILLIN >=32 RESISTANT Resistant     CEFAZOLIN <=4 SENSITIVE Sensitive     CEFEPIME <=1 SENSITIVE Sensitive     CEFTAZIDIME <=1 SENSITIVE  Sensitive     CEFTRIAXONE <=1 SENSITIVE Sensitive     CIPROFLOXACIN <=0.25 SENSITIVE Sensitive     GENTAMICIN <=1 SENSITIVE Sensitive     IMIPENEM <=0.25 SENSITIVE Sensitive     TRIMETH/SULFA >=320 RESISTANT Resistant     AMPICILLIN/SULBACTAM 16 INTERMEDIATE Intermediate     PIP/TAZO <=4 SENSITIVE Sensitive     Extended ESBL NEGATIVE Sensitive     * ESCHERICHIA COLI   Klebsiella  pneumoniae - MIC*    AMPICILLIN >=32 RESISTANT Resistant     CEFAZOLIN >=64 RESISTANT Resistant     CEFEPIME 32 RESISTANT Resistant     CEFTAZIDIME RESISTANT Resistant     CEFTRIAXONE >=64 RESISTANT Resistant     CIPROFLOXACIN 2 INTERMEDIATE Intermediate     GENTAMICIN <=1 SENSITIVE Sensitive     IMIPENEM <=0.25 SENSITIVE Sensitive     TRIMETH/SULFA >=320 RESISTANT Resistant     AMPICILLIN/SULBACTAM >=32 RESISTANT Resistant     PIP/TAZO 32 INTERMEDIATE Intermediate     Extended ESBL POSITIVE Resistant     * KLEBSIELLA PNEUMONIAE  Culture, blood (routine x 2)     Status: Abnormal   Collection Time: 07/29/17 12:37 PM  Result Value Ref Range Status   Specimen Description BLOOD LEFT ANTECUBITAL  Final   Special Requests   Final    BOTTLES DRAWN AEROBIC ONLY Blood Culture results may not be optimal due to an inadequate volume of blood received in culture bottles   Culture  Setup Time   Final    AEROBIC BOTTLE ONLY GRAM NEGATIVE RODS CRITICAL VALUE NOTED.  VALUE IS CONSISTENT WITH PREVIOUSLY REPORTED AND CALLED VALUE.    Culture (A)  Final    KLEBSIELLA PNEUMONIAE SUSCEPTIBILITIES PERFORMED ON PREVIOUS CULTURE WITHIN THE LAST 5 DAYS. Performed at [facility], [address], [place], [place] [address]    Report Status 08/01/2017 FINAL  Final  Blood Culture ID Panel (Reflexed)     Status: Abnormal   Collection Time: 07/29/17 12:37 PM  Result Value Ref Range Status   Enterococcus species NOT DETECTED NOT DETECTED Final   Listeria monocytogenes NOT DETECTED NOT DETECTED Final   Staphylococcus  species NOT DETECTED NOT DETECTED Final   Staphylococcus aureus NOT DETECTED NOT DETECTED Final   Streptococcus species NOT DETECTED NOT DETECTED Final   Streptococcus agalactiae NOT DETECTED NOT DETECTED Final   Streptococcus pneumoniae NOT DETECTED NOT DETECTED Final   Streptococcus pyogenes NOT DETECTED NOT DETECTED Final   Acinetobacter baumannii NOT DETECTED NOT DETECTED Final   Enterobacteriaceae species DETECTED (A) NOT DETECTED Final    Comment: CRITICAL RESULT CALLED TO, READ BACK BY AND VERIFIED WITH: Hartley Barefoot RN 07/30/17 0136 JDW    Enterobacter cloacae complex NOT DETECTED NOT DETECTED Final   Escherichia coli DETECTED (A) NOT DETECTED Final    Comment: CRITICAL RESULT CALLED TO, READ BACK BY AND VERIFIED WITH: Hartley Barefoot RN 07/30/17 0136 JDW    Klebsiella oxytoca NOT DETECTED NOT DETECTED Final   Klebsiella pneumoniae DETECTED (A) NOT DETECTED Final    Comment: CRITICAL RESULT CALLED TO, READ BACK BY AND VERIFIED WITH: Hartley Barefoot RN 07/30/17 0136 JDW    Proteus species NOT DETECTED NOT DETECTED Final   Serratia marcescens NOT DETECTED NOT DETECTED Final   Carbapenem resistance NOT DETECTED NOT DETECTED Final   Haemophilus influenzae NOT DETECTED NOT DETECTED Final   Neisseria meningitidis NOT DETECTED NOT DETECTED Final   Pseudomonas aeruginosa NOT DETECTED NOT DETECTED Final   Candida albicans NOT DETECTED NOT DETECTED Final   Candida glabrata NOT DETECTED NOT DETECTED Final   Candida krusei NOT DETECTED NOT DETECTED Final   Candida parapsilosis NOT DETECTED NOT DETECTED Final   Candida tropicalis NOT DETECTED NOT DETECTED Final    Comment: Performed at [facility], [address], [place], [place] [address]  C difficile quick scan w PCR reflex     Status: None   Collection Time: 08/12/17  9:30 AM  Result Value Ref Range Status   C Diff antigen NEGATIVE NEGATIVE Final   C Diff toxin NEGATIVE NEGATIVE Final   C Diff interpretation No C. difficile detected.   Final  Culture, Urine     Status: Abnormal   Collection Time: 08/12/17  1:43 PM  Result Value Ref Range Status   Specimen Description URINE, RANDOM  Final   Special Requests   Final    NONE Performed at [facility], [address] [place], [place] [address]    Culture >=100,000 COLONIES/mL KLEBSIELLA PNEUMONIAE (A)  Final   Report Status 08/14/2017 FINAL  Final   Organism ID, Bacteria KLEBSIELLA PNEUMONIAE (A)  Final      Susceptibility   Klebsiella pneumoniae - MIC*    AMPICILLIN >=32 RESISTANT Resistant     CEFAZOLIN >=64 RESISTANT Resistant     CEFTRIAXONE >=64 RESISTANT Resistant     CIPROFLOXACIN >=4 RESISTANT Resistant     GENTAMICIN 2 SENSITIVE Sensitive     IMIPENEM 0.5 SENSITIVE Sensitive     NITROFURANTOIN 256 RESISTANT Resistant     TRIMETH/SULFA >=320 RESISTANT Resistant     AMPICILLIN/SULBACTAM >=32 RESISTANT Resistant     PIP/TAZO >=128 RESISTANT Resistant     Extended ESBL POSITIVE Resistant     * >=100,000 COLONIES/mL KLEBSIELLA PNEUMONIAE  Culture, blood (routine x 2)     Status: None   Collection Time: 08/12/17  3:00 PM  Result Value Ref Range Status   Specimen Description BLOOD LEFT FOREARM  Final   Special Requests   Final    BOTTLES DRAWN AEROBIC AND ANAEROBIC Blood Culture adequate volume   Culture   Final    NO GROWTH 5 DAYS Performed at [facility], [address] [place], [place] [address]    Report Status 08/17/2017 FINAL  Final  Culture, blood (routine x 2)     Status: None   Collection Time: 08/12/17  3:10 PM  Result Value Ref Range Status   Specimen Description BLOOD LEFT HAND  Final   Special Requests   Final    BOTTLES DRAWN AEROBIC AND ANAEROBIC Blood Culture results may not be optimal due to an inadequate volume of blood received in culture bottles   Culture   Final    NO GROWTH 5 DAYS Performed at [facility], [address] [place], [place] [address]    Report Status 08/17/2017 FINAL  Final    Coagulation  Studies: Recent Labs    08/18/17 1127  LABPROT 17.2*  INR 1.42    Urinalysis: No results for input(s): COLORURINE, LABSPEC, PHURINE, GLUCOSEU, HGBUR, BILIRUBINUR, KETONESUR, PROTEINUR, UROBILINOGEN, NITRITE, LEUKOCYTESUR in the last 72 hours.  Invalid input(s): APPERANCEUR    Imaging: No results found.   Medications:     lidocaine (PF)  Assessment/ Plan:  62 y.o. 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   1.  Severe acute renal failure. 2.  Hyponatremia, Na 126 3.  Acute respiratory failure, currently on ventilator. 4.  Anemia unspecified, Hgb down 7.1 5.  Severe protein calorie malnutrition with severe emaciation, albumin 1.6 6.  Hyperkalemia, corrected with HD, K 4.8 7.  Multiorganism sepsis.  8.  Hypotension.   Plan: Patient remains oliguric at this time.  He cannot generate a high creatinine given severe emaciation.  We plan for hemodialysis today.  We will continue to use albumin for blood pressure support.  Hemoglobin low at 7.1.  Consider blood transfusion if hemoglobin falls any further.  Patient remains on the ventilator at this time with FiO2 of 28%.  We will continue to monitor her progress.   LOS: 0 Jinx Gilden 7/3/201911:10 AM

## 2017-08-19 NOTE — Progress Notes (Signed)
Pulmonary Critical Care Medicine [facility]   PULMONARY SERVICE  PROGRESS NOTE  Date of Service: 08/19/2017  Melvin Kelley  GNO:[id number]  DOB: 01-Aug-1955   DOA: 06/04/2017  Referring Physician: Carron Curie, MD  HPI: Melvin Kelley is a 62 y.o. male seen for follow up of Acute on Chronic Respiratory Failure.  Remains on full vent support patient is not weaning well right now is comfortable without distress.  Medications: Reviewed on Rounds  Physical Exam:  Vitals: Temperature 97.4 pulse 99 respiratory rate 26 blood pressure 91/77 saturations 96%  Ventilator Settings mode of ventilation assist control FiO2 20% tidal volume 454 PEEP 5  . General: Comfortable at this time . Eyes: Grossly normal lids, irises & conjunctiva . ENT: grossly tongue is normal . Neck: no obvious mass . Cardiovascular: S1 S2 normal no gallop . Respiratory: No rhonchi or rales are noted . Abdomen: soft . Skin: no rash seen on limited exam . Musculoskeletal: not rigid . Psychiatric:unable to assess . Neurologic: no seizure no involuntary movements         Lab Data:   Basic Metabolic Panel: Recent Labs  Lab 08/14/17 1600 08/15/17 0534 08/16/17 1049 08/17/17 0637 08/17/17 2358 08/19/17 0647  NA 132*  --   --  126*  --  126*  K 4.8  --   --  5.2*  --  4.8  CL 97*  --   --  93*  --  93*  CO2 28  --   --  22  --  27  GLUCOSE 158*  --   --  135*  --  193*  BUN 41*  --   --  50*  --  44*  CREATININE 1.63*  --   --  2.11*  --  1.87*  CALCIUM 8.2*  --   --  8.2*  --  7.5*  MG  --  1.5* 2.0  --  1.7  --   PHOS 5.3*  --   --  6.8*  --  5.9*    Liver Function Tests: Recent Labs  Lab 08/14/17 1600 08/17/17 0637 08/19/17 0647  ALBUMIN 1.6* 1.7* 1.6*   No results for input(s): LIPASE, AMYLASE in the last 168 hours. No results for input(s): AMMONIA in the last 168 hours.  CBC: Recent Labs  Lab 08/16/17 2025 08/17/17 0637 08/18/17 1127 08/19/17 0647 08/19/17 1042   WBC 13.2* 10.6* 7.9 11.0* 11.4*  HGB 7.8* 8.4* 7.1* 7.1* 7.1*  HCT 23.4* 26.6* 22.4* 21.9* 22.2*  MCV 90.3 91.7 94.1 92.0 91.7  PLT 56* 45* 24* 51* 44*    Cardiac Enzymes: No results for input(s): CKTOTAL, CKMB, CKMBINDEX, TROPONINI in the last 168 hours.  BNP (last 3 results) No results for input(s): BNP in the last 8760 hours.  ProBNP (last 3 results) No results for input(s): PROBNP in the last 8760 hours.  Radiological Exams: No results found.  Assessment/Plan Active Problems:   Cardiac arrest (HCC)   Acute on chronic respiratory failure (HCC)   CHF (congestive heart failure) (HCC)   Obstructive sleep apnea   Acute kidney injury (HCC)   1. Acute on chronic respiratory failure with hypoxia patient is not weaning will continue with full vent support.  Continue secretion management pulmonary toilet prognosis guarded. 2. Congestive heart failure at baseline we will continue to follow 3. Acute renal failure on dialysis followed by nephrology 4. Cardiac arrest rhythm is stable 5. Sleep apnea nonissue   I have personally seen and evaluated  the patient, evaluated laboratory and imaging results, formulated the assessment and plan and placed orders. The Patient requires high complexity decision making for assessment and support.  Case was discussed on Rounds with the Respiratory Therapy Staff  Allyne Gee, MD [facility] Pulmonary Critical Care Medicine Sleep Medicine

## 2017-08-20 LAB — CBC
HCT: 28.3 % — ABNORMAL LOW (ref 39.0–52.0)
HEMATOCRIT: 21 % — AB (ref 39.0–52.0)
HEMATOCRIT: 22.2 % — AB (ref 39.0–52.0)
HEMOGLOBIN: 6.5 g/dL — AB (ref 13.0–17.0)
Hemoglobin: 7.2 g/dL — ABNORMAL LOW (ref 13.0–17.0)
Hemoglobin: 9 g/dL — ABNORMAL LOW (ref 13.0–17.0)
MCH: 29.4 pg (ref 26.0–34.0)
MCH: 29.8 pg (ref 26.0–34.0)
MCH: 28.7 pg (ref 26.0–34.0)
MCHC: 31 g/dL (ref 30.0–36.0)
MCHC: 32.4 g/dL (ref 30.0–36.0)
MCHC: 31.8 g/dL (ref 30.0–36.0)
MCV: 95 fL (ref 78.0–100.0)
MCV: 91.7 fL (ref 78.0–100.0)
MCV: 90.1 fL (ref 78.0–100.0)
PLATELETS: 43 10*3/uL — AB (ref 150–400)
Platelets: 41 10*3/uL — ABNORMAL LOW (ref 150–400)
Platelets: 40 10*3/uL — ABNORMAL LOW (ref 150–400)
RBC: 2.21 MIL/uL — ABNORMAL LOW (ref 4.22–5.81)
RBC: 2.42 MIL/uL — AB (ref 4.22–5.81)
RBC: 3.14 MIL/uL — ABNORMAL LOW (ref 4.22–5.81)
RDW: 21 % — ABNORMAL HIGH (ref 11.5–15.5)
RDW: 20.8 % — ABNORMAL HIGH (ref 11.5–15.5)
RDW: 23.8 % — AB (ref 11.5–15.5)
WBC: 13 10*3/uL — ABNORMAL HIGH (ref 4.0–10.5)
WBC: 14.1 10*3/uL — ABNORMAL HIGH (ref 4.0–10.5)
WBC: 15.2 10*3/uL — ABNORMAL HIGH (ref 4.0–10.5)

## 2017-08-20 LAB — PREPARE RBC (CROSSMATCH)

## 2017-08-21 LAB — CBC
HCT: 26.5 % — ABNORMAL LOW (ref 39.0–52.0)
HCT: 27.5 % — ABNORMAL LOW (ref 39.0–52.0)
Hemoglobin: 8.2 g/dL — ABNORMAL LOW (ref 13.0–17.0)
Hemoglobin: 8.6 g/dL — ABNORMAL LOW (ref 13.0–17.0)
MCH: 28.2 pg (ref 26.0–34.0)
MCH: 28.4 pg (ref 26.0–34.0)
MCHC: 30.9 g/dL (ref 30.0–36.0)
MCHC: 31.3 g/dL (ref 30.0–36.0)
MCV: 91.1 fL (ref 78.0–100.0)
MCV: 90.8 fL (ref 78.0–100.0)
PLATELETS: 28 10*3/uL — AB (ref 150–400)
PLATELETS: 29 10*3/uL — AB (ref 150–400)
RBC: 2.91 MIL/uL — ABNORMAL LOW (ref 4.22–5.81)
RBC: 3.03 MIL/uL — AB (ref 4.22–5.81)
RDW: 24.4 % — ABNORMAL HIGH (ref 11.5–15.5)
RDW: 24.9 % — ABNORMAL HIGH (ref 11.5–15.5)
WBC: 13.3 10*3/uL — AB (ref 4.0–10.5)
WBC: 12.3 10*3/uL — AB (ref 4.0–10.5)

## 2017-08-21 LAB — BPAM RBC
Blood Product Expiration Date: 201907102359
ISSUE DATE / TIME: 201907041215
Unit Type and Rh: 7300

## 2017-08-21 LAB — RENAL FUNCTION PANEL
ANION GAP: 6 (ref 5–15)
Albumin: 1.6 g/dL — ABNORMAL LOW (ref 3.5–5.0)
BUN: 37 mg/dL — ABNORMAL HIGH (ref 6–20)
CALCIUM: 8.1 mg/dL — AB (ref 8.9–10.3)
CHLORIDE: 95 mmol/L — AB (ref 98–111)
CO2: 26 mmol/L (ref 22–32)
CREATININE: 1.81 mg/dL — AB (ref 0.61–1.24)
GFR, EST AFRICAN AMERICAN: 45 mL/min — AB (ref >60)
GFR, EST NON AFRICAN AMERICAN: 39 mL/min — AB (ref >60)
Glucose, Bld: 230 mg/dL — ABNORMAL HIGH (ref 70–99)
Phosphorus: 4.1 mg/dL (ref 2.5–4.6)
Potassium: 4 mmol/L (ref 3.5–5.1)
Sodium: 127 mmol/L — ABNORMAL LOW (ref 135–145)

## 2017-08-21 LAB — TYPE AND SCREEN
ABO/RH(D): B POS
Antibody Screen: NEGATIVE
UNIT DIVISION: 0

## 2017-08-21 LAB — FIBRINOGEN: FIBRINOGEN: 350 mg/dL (ref 210–475)

## 2017-08-21 LAB — MAGNESIUM: MAGNESIUM: 1.9 mg/dL (ref 1.7–2.4)

## 2017-08-21 NOTE — Progress Notes (Signed)
[facility]  ROUNDING NOTE   Subjective:  Patient seen at bedside.  Worse today.   Fio2 up to 35%.   Awake but not following commands.  Still TPN.   Objective:  Vital signs in last 24 hours:  Temperature 98 pulse 74 respirations 35 blood pressure 100/53  Physical Exam: General: Critically ill appearing  Head: Severe temporal wasting, NG in place  Eyes: Anicteric  Neck: Tracheostomy in place  Lungs:  Bilateral rhonchi, vent assisted  Heart: S1S2 no rubs  Abdomen:  Soft, nontender, bowel sounds present  Extremities: 1+ peripheral edema.  Neurologic: Awake, not following commands  Skin: Ulcers on feet noted  Access: L IJ temporary dialysis catheter    Basic Metabolic Panel: Recent Labs  Lab 08/14/17 1600 08/15/17 0534 08/16/17 1049 08/17/17 0637 08/17/17 2358 08/19/17 0647 08/21/17 0000 08/21/17 0559  NA 132*  --   --  126*  --  126*  --  127*  K 4.8  --   --  5.2*  --  4.8  --  4.0  CL 97*  --   --  93*  --  93*  --  95*  CO2 28  --   --  22  --  27  --  26  GLUCOSE 158*  --   --  135*  --  193*  --  230*  BUN 41*  --   --  50*  --  44*  --  37*  CREATININE 1.63*  --   --  2.11*  --  1.87*  --  1.81*  CALCIUM 8.2*  --   --  8.2*  --  7.5*  --  8.1*  MG  --  1.5* 2.0  --  1.7  --  1.9  --   PHOS 5.3*  --   --  6.8*  --  5.9*  --  4.1    Liver Function Tests: Recent Labs  Lab 08/14/17 1600 08/17/17 0637 08/19/17 0647 08/21/17 0559  ALBUMIN 1.6* 1.7* 1.6* 1.6*   No results for input(s): LIPASE, AMYLASE in the last 168 hours. No results for input(s): AMMONIA in the last 168 hours.  CBC: Recent Labs  Lab 08/19/17 1042 08/20/17 0645 08/20/17 1032 08/20/17 2157 08/21/17 0559  WBC 11.4* 13.0* 14.1* 15.2* 13.3*  HGB 7.1* 6.5* 7.2* 9.0* 8.2*  HCT 22.2* 21.0* 22.2* 28.3* 26.5*  MCV 91.7 95.0 91.7 90.1 91.1  PLT 44* 41* 43* 40* 28*    Cardiac Enzymes: No results for input(s): CKTOTAL, CKMB, CKMBINDEX, TROPONINI in the last 168  hours.  BNP: Invalid input(s): POCBNP  CBG: No results for input(s): GLUCAP in the last 168 hours.  Microbiology: Results for orders placed or performed during the hospital encounter of 2017/06/19  Culture, Urine     Status: Abnormal   Collection Time: 07/29/17 12:20 PM  Result Value Ref Range Status   Specimen Description URINE, RANDOM  Final   Special Requests NONE  Final   Culture (A)  Final    >=100,000 COLONIES/mL ESCHERICHIA COLI >=100,000 COLONIES/mL KLEBSIELLA PNEUMONIAE Confirmed Extended Spectrum Beta-Lactamase Producer (ESBL).  In bloodstream infections from ESBL organisms, carbapenems are preferred over piperacillin/tazobactam. They are shown to have a lower risk of mortality. Performed at [facility], [address] [place], [place] [address]    Report Status 08/01/2017 FINAL  Final   Organism ID, Bacteria ESCHERICHIA COLI (A)  Final   Organism ID, Bacteria KLEBSIELLA PNEUMONIAE (A)  Final  Susceptibility   Escherichia coli - MIC*    AMPICILLIN >=32 RESISTANT Resistant     CEFAZOLIN <=4 SENSITIVE Sensitive     CEFTRIAXONE <=1 SENSITIVE Sensitive     CIPROFLOXACIN <=0.25 SENSITIVE Sensitive     GENTAMICIN <=1 SENSITIVE Sensitive     IMIPENEM <=0.25 SENSITIVE Sensitive     NITROFURANTOIN <=16 SENSITIVE Sensitive     TRIMETH/SULFA >=320 RESISTANT Resistant     AMPICILLIN/SULBACTAM 16 INTERMEDIATE Intermediate     PIP/TAZO <=4 SENSITIVE Sensitive     Extended ESBL NEGATIVE Sensitive     * >=100,000 COLONIES/mL ESCHERICHIA COLI   Klebsiella pneumoniae - MIC*    AMPICILLIN >=32 RESISTANT Resistant     CEFAZOLIN >=64 RESISTANT Resistant     CEFTRIAXONE >=64 RESISTANT Resistant     CIPROFLOXACIN 2 INTERMEDIATE Intermediate     GENTAMICIN <=1 SENSITIVE Sensitive     IMIPENEM <=0.25 SENSITIVE Sensitive     NITROFURANTOIN 32 SENSITIVE Sensitive     TRIMETH/SULFA >=320 RESISTANT Resistant     AMPICILLIN/SULBACTAM >=32 RESISTANT Resistant     PIP/TAZO 32  INTERMEDIATE Intermediate     Extended ESBL POSITIVE Resistant     * >=100,000 COLONIES/mL KLEBSIELLA PNEUMONIAE  C difficile quick scan w PCR reflex     Status: Abnormal   Collection Time: 07/29/17 12:24 PM  Result Value Ref Range Status   C Diff antigen POSITIVE (A) NEGATIVE Final   C Diff toxin NEGATIVE NEGATIVE Final   C Diff interpretation Results are indeterminate. See PCR results.  Final    Comment: Performed at [facility], [address], [place], [place] [address]  C. Diff by PCR, Reflexed     Status: None   Collection Time: 07/29/17 12:24 PM  Result Value Ref Range Status   Toxigenic C. Difficile by PCR NEGATIVE NEGATIVE Final    Comment: Patient is colonized with non toxigenic C. difficile. May not need treatment unless significant symptoms are present. Performed at [facility], [address] [place], [place] [address]   Culture, blood (routine x 2)     Status: Abnormal   Collection Time: 07/29/17 12:37 PM  Result Value Ref Range Status   Specimen Description BLOOD LEFT ANTECUBITAL  Final   Special Requests   Final    BOTTLES DRAWN AEROBIC ONLY Blood Culture results may not be optimal due to an inadequate volume of blood received in culture bottles   Culture  Setup Time   Final    AEROBIC BOTTLE ONLY GRAM NEGATIVE RODS CRITICAL RESULT CALLED TO, READ BACK BY AND VERIFIED WITH: Hartley Barefoot RN 07/30/17 0139 JDW    Culture (A)  Final    ESCHERICHIA COLI KLEBSIELLA PNEUMONIAE Confirmed Extended Spectrum Beta-Lactamase Producer (ESBL).  In bloodstream infections from ESBL organisms, carbapenems are preferred over piperacillin/tazobactam. They are shown to have a lower risk of mortality. Performed at [facility], [address] [place], [place] [address]    Report Status 08/01/2017 FINAL  Final   Organism ID, Bacteria ESCHERICHIA COLI  Final   Organism ID, Bacteria KLEBSIELLA PNEUMONIAE  Final      Susceptibility   Escherichia coli - MIC*     AMPICILLIN >=32 RESISTANT Resistant     CEFAZOLIN <=4 SENSITIVE Sensitive     CEFEPIME <=1 SENSITIVE Sensitive     CEFTAZIDIME <=1 SENSITIVE Sensitive     CEFTRIAXONE <=1 SENSITIVE Sensitive     CIPROFLOXACIN <=0.25 SENSITIVE Sensitive     GENTAMICIN <=1 SENSITIVE Sensitive  IMIPENEM <=0.25 SENSITIVE Sensitive     TRIMETH/SULFA >=320 RESISTANT Resistant     AMPICILLIN/SULBACTAM 16 INTERMEDIATE Intermediate     PIP/TAZO <=4 SENSITIVE Sensitive     Extended ESBL NEGATIVE Sensitive     * ESCHERICHIA COLI   Klebsiella pneumoniae - MIC*    AMPICILLIN >=32 RESISTANT Resistant     CEFAZOLIN >=64 RESISTANT Resistant     CEFEPIME 32 RESISTANT Resistant     CEFTAZIDIME RESISTANT Resistant     CEFTRIAXONE >=64 RESISTANT Resistant     CIPROFLOXACIN 2 INTERMEDIATE Intermediate     GENTAMICIN <=1 SENSITIVE Sensitive     IMIPENEM <=0.25 SENSITIVE Sensitive     TRIMETH/SULFA >=320 RESISTANT Resistant     AMPICILLIN/SULBACTAM >=32 RESISTANT Resistant     PIP/TAZO 32 INTERMEDIATE Intermediate     Extended ESBL POSITIVE Resistant     * KLEBSIELLA PNEUMONIAE  Culture, blood (routine x 2)     Status: Abnormal   Collection Time: 07/29/17 12:37 PM  Result Value Ref Range Status   Specimen Description BLOOD LEFT ANTECUBITAL  Final   Special Requests   Final    BOTTLES DRAWN AEROBIC ONLY Blood Culture results may not be optimal due to an inadequate volume of blood received in culture bottles   Culture  Setup Time   Final    AEROBIC BOTTLE ONLY GRAM NEGATIVE RODS CRITICAL VALUE NOTED.  VALUE IS CONSISTENT WITH PREVIOUSLY REPORTED AND CALLED VALUE.    Culture (A)  Final    KLEBSIELLA PNEUMONIAE SUSCEPTIBILITIES PERFORMED ON PREVIOUS CULTURE WITHIN THE LAST 5 DAYS. Performed at [facility], [address], [place], [place] [address]    Report Status 08/01/2017 FINAL  Final  Blood Culture ID Panel (Reflexed)     Status: Abnormal   Collection Time: 07/29/17 12:37 PM  Result Value Ref Range  Status   Enterococcus species NOT DETECTED NOT DETECTED Final   Listeria monocytogenes NOT DETECTED NOT DETECTED Final   Staphylococcus species NOT DETECTED NOT DETECTED Final   Staphylococcus aureus NOT DETECTED NOT DETECTED Final   Streptococcus species NOT DETECTED NOT DETECTED Final   Streptococcus agalactiae NOT DETECTED NOT DETECTED Final   Streptococcus pneumoniae NOT DETECTED NOT DETECTED Final   Streptococcus pyogenes NOT DETECTED NOT DETECTED Final   Acinetobacter baumannii NOT DETECTED NOT DETECTED Final   Enterobacteriaceae species DETECTED (A) NOT DETECTED Final    Comment: CRITICAL RESULT CALLED TO, READ BACK BY AND VERIFIED WITH: Hartley Barefoot RN 07/30/17 0136 JDW    Enterobacter cloacae complex NOT DETECTED NOT DETECTED Final   Escherichia coli DETECTED (A) NOT DETECTED Final    Comment: CRITICAL RESULT CALLED TO, READ BACK BY AND VERIFIED WITH: Hartley Barefoot RN 07/30/17 0136 JDW    Klebsiella oxytoca NOT DETECTED NOT DETECTED Final   Klebsiella pneumoniae DETECTED (A) NOT DETECTED Final    Comment: CRITICAL RESULT CALLED TO, READ BACK BY AND VERIFIED WITH: Hartley Barefoot RN 07/30/17 0136 JDW    Proteus species NOT DETECTED NOT DETECTED Final   Serratia marcescens NOT DETECTED NOT DETECTED Final   Carbapenem resistance NOT DETECTED NOT DETECTED Final   Haemophilus influenzae NOT DETECTED NOT DETECTED Final   Neisseria meningitidis NOT DETECTED NOT DETECTED Final   Pseudomonas aeruginosa NOT DETECTED NOT DETECTED Final   Candida albicans NOT DETECTED NOT DETECTED Final   Candida glabrata NOT DETECTED NOT DETECTED Final   Candida krusei NOT DETECTED NOT DETECTED Final   Candida parapsilosis NOT DETECTED NOT DETECTED Final   Candida  tropicalis NOT DETECTED NOT DETECTED Final    Comment: Performed at [facility], [address] [place], [place] [address]  C difficile quick scan w PCR reflex     Status: None   Collection Time: 08/12/17  9:30 AM  Result Value Ref Range  Status   C Diff antigen NEGATIVE NEGATIVE Final   C Diff toxin NEGATIVE NEGATIVE Final   C Diff interpretation No C. difficile detected.  Final  Culture, Urine     Status: Abnormal   Collection Time: 08/12/17  1:43 PM  Result Value Ref Range Status   Specimen Description URINE, RANDOM  Final   Special Requests   Final    NONE Performed at [facility], [address], [place], [place] [address]    Culture >=100,000 COLONIES/mL KLEBSIELLA PNEUMONIAE (A)  Final   Report Status 08/14/2017 FINAL  Final   Organism ID, Bacteria KLEBSIELLA PNEUMONIAE (A)  Final      Susceptibility   Klebsiella pneumoniae - MIC*    AMPICILLIN >=32 RESISTANT Resistant     CEFAZOLIN >=64 RESISTANT Resistant     CEFTRIAXONE >=64 RESISTANT Resistant     CIPROFLOXACIN >=4 RESISTANT Resistant     GENTAMICIN 2 SENSITIVE Sensitive     IMIPENEM 0.5 SENSITIVE Sensitive     NITROFURANTOIN 256 RESISTANT Resistant     TRIMETH/SULFA >=320 RESISTANT Resistant     AMPICILLIN/SULBACTAM >=32 RESISTANT Resistant     PIP/TAZO >=128 RESISTANT Resistant     Extended ESBL POSITIVE Resistant     * >=100,000 COLONIES/mL KLEBSIELLA PNEUMONIAE  Culture, blood (routine x 2)     Status: None   Collection Time: 08/12/17  3:00 PM  Result Value Ref Range Status   Specimen Description BLOOD LEFT FOREARM  Final   Special Requests   Final    BOTTLES DRAWN AEROBIC AND ANAEROBIC Blood Culture adequate volume   Culture   Final    NO GROWTH 5 DAYS Performed at [facility], [address], [place], [place] [address]    Report Status 08/17/2017 FINAL  Final  Culture, blood (routine x 2)     Status: None   Collection Time: 08/12/17  3:10 PM  Result Value Ref Range Status   Specimen Description BLOOD LEFT HAND  Final   Special Requests   Final    BOTTLES DRAWN AEROBIC AND ANAEROBIC Blood Culture results may not be optimal due to an inadequate volume of blood received in culture bottles   Culture   Final    NO GROWTH 5  DAYS Performed at [facility], [address], [place], [place] [address]    Report Status 08/17/2017 FINAL  Final    Coagulation Studies: Recent Labs    08/18/17 1127  LABPROT 17.2*  INR 1.42    Urinalysis: No results for input(s): COLORURINE, LABSPEC, PHURINE, GLUCOSEU, HGBUR, BILIRUBINUR, KETONESUR, PROTEINUR, UROBILINOGEN, NITRITE, LEUKOCYTESUR in the last 72 hours.  Invalid input(s): APPERANCEUR    Imaging: No results found.   Medications:     lidocaine (PF)  Assessment/ Plan:  62 y.o. 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   1.  Severe acute renal failure. 2.  Hyponatremia, Na 127 3.  Acute respiratory failure, currently on ventilator, fio2 up to 35% 4.  Anemia unspecified, Hgb up to 8.2 post transfusion 5.  Severe protein calorie malnutrition with severe emaciation, albumin 1.6 6.  Hyperkalemia, corrected with HD, K 4 7.  Multiorganism sepsis.  8.  Hypotension.   Plan: Patient remains dialysis dependent.  We will plan for HD today, orders have been prepared.  Thereafter we will plan for HD again on Monday.  Continue to perform UF with HD as tolerated, we will continue to use albumin for BP support during treatments.  Hgb up to 8.2 post transfusion, continue to monitor hgb.  Respiratory failure worse today with fio2 up to 35%.  Hopefully UF will help to bring this down again.  Continue TPN for severe malnutrition.  Prognosis remains guarded.    LOS: 0 Melvin Kelley 7/5/20197:50 AM

## 2017-08-22 LAB — PREPARE PLATELET PHERESIS
Unit division: 0
Unit division: 0

## 2017-08-22 LAB — BPAM PLATELET PHERESIS
Blood Product Expiration Date: 201907052359
Blood Product Expiration Date: 201907062359
ISSUE DATE / TIME: 201907051358
ISSUE DATE / TIME: 201907051239
Unit Type and Rh: 6200
Unit Type and Rh: 9500

## 2017-08-22 LAB — CBC
HEMATOCRIT: 25.5 % — AB (ref 39.0–52.0)
Hemoglobin: 7.8 g/dL — ABNORMAL LOW (ref 13.0–17.0)
MCH: 28.4 pg (ref 26.0–34.0)
MCHC: 30.6 g/dL (ref 30.0–36.0)
MCV: 92.7 fL (ref 78.0–100.0)
Platelets: 60 10*3/uL — ABNORMAL LOW (ref 150–400)
RBC: 2.75 MIL/uL — ABNORMAL LOW (ref 4.22–5.81)
RDW: 24.9 % — AB (ref 11.5–15.5)
WBC: 21.4 10*3/uL — ABNORMAL HIGH (ref 4.0–10.5)

## 2017-08-22 LAB — HEPARIN INDUCED PLATELET AB (HIT ANTIBODY): HEPARIN INDUCED PLT AB: 0.467 {OD_unit} — AB (ref 0.000–0.400)

## 2017-08-23 LAB — BLOOD CULTURE ID PANEL (REFLEXED)
ACINETOBACTER BAUMANNII: NOT DETECTED
CANDIDA ALBICANS: NOT DETECTED
CANDIDA PARAPSILOSIS: NOT DETECTED
Candida glabrata: NOT DETECTED
Candida krusei: NOT DETECTED
Candida tropicalis: NOT DETECTED
ENTEROCOCCUS SPECIES: NOT DETECTED
ESCHERICHIA COLI: NOT DETECTED
Enterobacter cloacae complex: NOT DETECTED
Enterobacteriaceae species: NOT DETECTED
HAEMOPHILUS INFLUENZAE: NOT DETECTED
Klebsiella oxytoca: NOT DETECTED
Klebsiella pneumoniae: NOT DETECTED
Listeria monocytogenes: NOT DETECTED
Methicillin resistance: DETECTED — AB
NEISSERIA MENINGITIDIS: NOT DETECTED
PROTEUS SPECIES: NOT DETECTED
PSEUDOMONAS AERUGINOSA: NOT DETECTED
STAPHYLOCOCCUS AUREUS BCID: NOT DETECTED
STREPTOCOCCUS PNEUMONIAE: NOT DETECTED
STREPTOCOCCUS PYOGENES: NOT DETECTED
STREPTOCOCCUS SPECIES: NOT DETECTED
Serratia marcescens: NOT DETECTED
Staphylococcus species: DETECTED — AB
Streptococcus agalactiae: NOT DETECTED

## 2017-08-24 ENCOUNTER — Other Ambulatory Visit (HOSPITAL_COMMUNITY): Payer: Self-pay

## 2017-08-24 DIAGNOSIS — I959 Hypotension, unspecified: Secondary | ICD-10-CM

## 2017-08-24 LAB — RENAL FUNCTION PANEL
ALBUMIN: 1.4 g/dL — AB (ref 3.5–5.0)
ANION GAP: 7 (ref 5–15)
BUN: 62 mg/dL — ABNORMAL HIGH (ref 6–20)
CO2: 22 mmol/L (ref 22–32)
Calcium: 7.7 mg/dL — ABNORMAL LOW (ref 8.9–10.3)
Chloride: 95 mmol/L — ABNORMAL LOW (ref 98–111)
Creatinine, Ser: 2.99 mg/dL — ABNORMAL HIGH (ref 0.61–1.24)
GFR, EST AFRICAN AMERICAN: 25 mL/min — AB (ref >60)
GFR, EST NON AFRICAN AMERICAN: 21 mL/min — AB (ref >60)
Glucose, Bld: 287 mg/dL — ABNORMAL HIGH (ref 70–99)
PHOSPHORUS: 7.3 mg/dL — AB (ref 2.5–4.6)
POTASSIUM: 6 mmol/L — AB (ref 3.5–5.1)
Sodium: 124 mmol/L — ABNORMAL LOW (ref 135–145)

## 2017-08-24 LAB — CBC
HEMATOCRIT: 23.1 % — AB (ref 39.0–52.0)
HEMOGLOBIN: 7.3 g/dL — AB (ref 13.0–17.0)
MCH: 28.4 pg (ref 26.0–34.0)
MCHC: 31.6 g/dL (ref 30.0–36.0)
MCV: 89.9 fL (ref 78.0–100.0)
Platelets: 36 10*3/uL — ABNORMAL LOW (ref 150–400)
RBC: 2.57 MIL/uL — AB (ref 4.22–5.81)
RDW: 23.4 % — ABNORMAL HIGH (ref 11.5–15.5)
WBC: 22.3 10*3/uL — AB (ref 4.0–10.5)

## 2017-08-24 LAB — MAGNESIUM: Magnesium: 1.9 mg/dL (ref 1.7–2.4)

## 2017-08-24 NOTE — Progress Notes (Signed)
[facility]  ROUNDING NOTE   Subjective:  Patient completed hemodialysis earlier today. Patient has significant increase in swelling. Remains critically ill.  Objective:  Vital signs in last 24 hours:  Temperature 96.8 pulse 94 respirations 22 blood pressure 112/58  Physical Exam: General: Critically ill appearing  Head: Severe temporal wasting, NG in place  Eyes: Anicteric  Neck: Tracheostomy in place  Lungs:  Bilateral rhonchi, vent assisted  Heart: S1S2 no rubs  Abdomen:  Soft, nontender, bowel sounds present  Extremities: 3+ peripheral edema.  Neurologic: Awake, not following commands  Skin: Ulcers on feet noted  Access: L IJ temporary dialysis catheter    Basic Metabolic Panel: Recent Labs  Lab 08/17/17 2358 08/19/17 0647 08/21/17 0000 08/21/17 0559 08/23/17 2343 08/24/17 0618  NA  --  126*  --  127*  --  124*  K  --  4.8  --  4.0  --  6.0*  CL  --  93*  --  95*  --  95*  CO2  --  27  --  26  --  22  GLUCOSE  --  193*  --  230*  --  287*  BUN  --  44*  --  37*  --  62*  CREATININE  --  1.87*  --  1.81*  --  2.99*  CALCIUM  --  7.5*  --  8.1*  --  7.7*  MG 1.7  --  1.9  --  1.9  --   PHOS  --  5.9*  --  4.1  --  7.3*    Liver Function Tests: Recent Labs  Lab 08/19/17 0647 08/21/17 0559 08/24/17 0618  ALBUMIN 1.6* 1.6* 1.4*   No results for input(s): LIPASE, AMYLASE in the last 168 hours. No results for input(s): AMMONIA in the last 168 hours.  CBC: Recent Labs  Lab 08/20/17 2157 08/21/17 0559 08/21/17 1035 08/22/17 1251 08/24/17 0618  WBC 15.2* 13.3* 12.3* 21.4* 22.3*  HGB 9.0* 8.2* 8.6* 7.8* 7.3*  HCT 28.3* 26.5* 27.5* 25.5* 23.1*  MCV 90.1 91.1 90.8 92.7 89.9  PLT 40* 28* 29* 60* 36*    Cardiac Enzymes: No results for input(s): CKTOTAL, CKMB, CKMBINDEX, TROPONINI in the last 168 hours.  BNP: Invalid input(s): POCBNP  CBG: No results for input(s): GLUCAP in the last 168 hours.  Microbiology: Results for orders  placed or performed during the hospital encounter of 06/07/2017  Culture, Urine     Status: Abnormal   Collection Time: 07/29/17 12:20 PM  Result Value Ref Range Status   Specimen Description URINE, RANDOM  Final   Special Requests NONE  Final   Culture (A)  Final    >=100,000 COLONIES/mL ESCHERICHIA COLI >=100,000 COLONIES/mL KLEBSIELLA PNEUMONIAE Confirmed Extended Spectrum Beta-Lactamase Producer (ESBL).  In bloodstream infections from ESBL organisms, carbapenems are preferred over piperacillin/tazobactam. They are shown to have a lower risk of mortality. Performed at [facility], [address] [place], [place] [address]    Report Status 08/01/2017 FINAL  Final   Organism ID, Bacteria ESCHERICHIA COLI (A)  Final   Organism ID, Bacteria KLEBSIELLA PNEUMONIAE (A)  Final      Susceptibility   Escherichia coli - MIC*    AMPICILLIN >=32 RESISTANT Resistant     CEFAZOLIN <=4 SENSITIVE Sensitive     CEFTRIAXONE <=1 SENSITIVE Sensitive     CIPROFLOXACIN <=0.25 SENSITIVE Sensitive     GENTAMICIN <=1 SENSITIVE Sensitive     IMIPENEM <=0.25 SENSITIVE Sensitive  NITROFURANTOIN <=16 SENSITIVE Sensitive     TRIMETH/SULFA >=320 RESISTANT Resistant     AMPICILLIN/SULBACTAM 16 INTERMEDIATE Intermediate     PIP/TAZO <=4 SENSITIVE Sensitive     Extended ESBL NEGATIVE Sensitive     * >=100,000 COLONIES/mL ESCHERICHIA COLI   Klebsiella pneumoniae - MIC*    AMPICILLIN >=32 RESISTANT Resistant     CEFAZOLIN >=64 RESISTANT Resistant     CEFTRIAXONE >=64 RESISTANT Resistant     CIPROFLOXACIN 2 INTERMEDIATE Intermediate     GENTAMICIN <=1 SENSITIVE Sensitive     IMIPENEM <=0.25 SENSITIVE Sensitive     NITROFURANTOIN 32 SENSITIVE Sensitive     TRIMETH/SULFA >=320 RESISTANT Resistant     AMPICILLIN/SULBACTAM >=32 RESISTANT Resistant     PIP/TAZO 32 INTERMEDIATE Intermediate     Extended ESBL POSITIVE Resistant     * >=100,000 COLONIES/mL KLEBSIELLA PNEUMONIAE  C difficile quick scan w PCR  reflex     Status: Abnormal   Collection Time: 07/29/17 12:24 PM  Result Value Ref Range Status   C Diff antigen POSITIVE (A) NEGATIVE Final   C Diff toxin NEGATIVE NEGATIVE Final   C Diff interpretation Results are indeterminate. See PCR results.  Final    Comment: Performed at [facility], [address] [place], [place] [address]  C. Diff by PCR, Reflexed     Status: None   Collection Time: 07/29/17 12:24 PM  Result Value Ref Range Status   Toxigenic C. Difficile by PCR NEGATIVE NEGATIVE Final    Comment: Patient is colonized with non toxigenic C. difficile. May not need treatment unless significant symptoms are present. Performed at [facility], [address] [place], [place] [address]   Culture, blood (routine x 2)     Status: Abnormal   Collection Time: 07/29/17 12:37 PM  Result Value Ref Range Status   Specimen Description BLOOD LEFT ANTECUBITAL  Final   Special Requests   Final    BOTTLES DRAWN AEROBIC ONLY Blood Culture results may not be optimal due to an inadequate volume of blood received in culture bottles   Culture  Setup Time   Final    AEROBIC BOTTLE ONLY GRAM NEGATIVE RODS CRITICAL RESULT CALLED TO, READ BACK BY AND VERIFIED WITH: Hartley Barefoot RN 07/30/17 0139 JDW    Culture (A)  Final    ESCHERICHIA COLI KLEBSIELLA PNEUMONIAE Confirmed Extended Spectrum Beta-Lactamase Producer (ESBL).  In bloodstream infections from ESBL organisms, carbapenems are preferred over piperacillin/tazobactam. They are shown to have a lower risk of mortality. Performed at [facility], [address] [place], [place] [address]    Report Status 08/01/2017 FINAL  Final   Organism ID, Bacteria ESCHERICHIA COLI  Final   Organism ID, Bacteria KLEBSIELLA PNEUMONIAE  Final      Susceptibility   Escherichia coli - MIC*    AMPICILLIN >=32 RESISTANT Resistant     CEFAZOLIN <=4 SENSITIVE Sensitive     CEFEPIME <=1 SENSITIVE Sensitive     CEFTAZIDIME <=1 SENSITIVE Sensitive      CEFTRIAXONE <=1 SENSITIVE Sensitive     CIPROFLOXACIN <=0.25 SENSITIVE Sensitive     GENTAMICIN <=1 SENSITIVE Sensitive     IMIPENEM <=0.25 SENSITIVE Sensitive     TRIMETH/SULFA >=320 RESISTANT Resistant     AMPICILLIN/SULBACTAM 16 INTERMEDIATE Intermediate     PIP/TAZO <=4 SENSITIVE Sensitive     Extended ESBL NEGATIVE Sensitive     * ESCHERICHIA COLI   Klebsiella pneumoniae - MIC*    AMPICILLIN >=32  RESISTANT Resistant     CEFAZOLIN >=64 RESISTANT Resistant     CEFEPIME 32 RESISTANT Resistant     CEFTAZIDIME RESISTANT Resistant     CEFTRIAXONE >=64 RESISTANT Resistant     CIPROFLOXACIN 2 INTERMEDIATE Intermediate     GENTAMICIN <=1 SENSITIVE Sensitive     IMIPENEM <=0.25 SENSITIVE Sensitive     TRIMETH/SULFA >=320 RESISTANT Resistant     AMPICILLIN/SULBACTAM >=32 RESISTANT Resistant     PIP/TAZO 32 INTERMEDIATE Intermediate     Extended ESBL POSITIVE Resistant     * KLEBSIELLA PNEUMONIAE  Culture, blood (routine x 2)     Status: Abnormal   Collection Time: 07/29/17 12:37 PM  Result Value Ref Range Status   Specimen Description BLOOD LEFT ANTECUBITAL  Final   Special Requests   Final    BOTTLES DRAWN AEROBIC ONLY Blood Culture results may not be optimal due to an inadequate volume of blood received in culture bottles   Culture  Setup Time   Final    AEROBIC BOTTLE ONLY GRAM NEGATIVE RODS CRITICAL VALUE NOTED.  VALUE IS CONSISTENT WITH PREVIOUSLY REPORTED AND CALLED VALUE.    Culture (A)  Final    KLEBSIELLA PNEUMONIAE SUSCEPTIBILITIES PERFORMED ON PREVIOUS CULTURE WITHIN THE LAST 5 DAYS. Performed at [facility], [address], [place], [place] [address]    Report Status 08/01/2017 FINAL  Final  Blood Culture ID Panel (Reflexed)     Status: Abnormal   Collection Time: 07/29/17 12:37 PM  Result Value Ref Range Status   Enterococcus species NOT DETECTED NOT DETECTED Final   Listeria monocytogenes NOT DETECTED NOT DETECTED Final   Staphylococcus species NOT  DETECTED NOT DETECTED Final   Staphylococcus aureus NOT DETECTED NOT DETECTED Final   Streptococcus species NOT DETECTED NOT DETECTED Final   Streptococcus agalactiae NOT DETECTED NOT DETECTED Final   Streptococcus pneumoniae NOT DETECTED NOT DETECTED Final   Streptococcus pyogenes NOT DETECTED NOT DETECTED Final   Acinetobacter baumannii NOT DETECTED NOT DETECTED Final   Enterobacteriaceae species DETECTED (A) NOT DETECTED Final    Comment: CRITICAL RESULT CALLED TO, READ BACK BY AND VERIFIED WITH: Hartley Barefoot RN 07/30/17 0136 JDW    Enterobacter cloacae complex NOT DETECTED NOT DETECTED Final   Escherichia coli DETECTED (A) NOT DETECTED Final    Comment: CRITICAL RESULT CALLED TO, READ BACK BY AND VERIFIED WITH: Hartley Barefoot RN 07/30/17 0136 JDW    Klebsiella oxytoca NOT DETECTED NOT DETECTED Final   Klebsiella pneumoniae DETECTED (A) NOT DETECTED Final    Comment: CRITICAL RESULT CALLED TO, READ BACK BY AND VERIFIED WITH: Hartley Barefoot RN 07/30/17 0136 JDW    Proteus species NOT DETECTED NOT DETECTED Final   Serratia marcescens NOT DETECTED NOT DETECTED Final   Carbapenem resistance NOT DETECTED NOT DETECTED Final   Haemophilus influenzae NOT DETECTED NOT DETECTED Final   Neisseria meningitidis NOT DETECTED NOT DETECTED Final   Pseudomonas aeruginosa NOT DETECTED NOT DETECTED Final   Candida albicans NOT DETECTED NOT DETECTED Final   Candida glabrata NOT DETECTED NOT DETECTED Final   Candida krusei NOT DETECTED NOT DETECTED Final   Candida parapsilosis NOT DETECTED NOT DETECTED Final   Candida tropicalis NOT DETECTED NOT DETECTED Final    Comment: Performed at [facility], [address] [place], [place] [address]  C difficile quick scan w PCR reflex     Status: None   Collection Time: 08/12/17  9:30 AM  Result Value Ref Range Status   C  Diff antigen NEGATIVE NEGATIVE Final   C Diff toxin NEGATIVE NEGATIVE Final   C Diff interpretation No C. difficile detected.  Final   Culture, Urine     Status: Abnormal   Collection Time: 08/12/17  1:43 PM  Result Value Ref Range Status   Specimen Description URINE, RANDOM  Final   Special Requests   Final    NONE Performed at [facility], [address] [place], [place] [address]    Culture >=100,000 COLONIES/mL KLEBSIELLA PNEUMONIAE (A)  Final   Report Status 08/14/2017 FINAL  Final   Organism ID, Bacteria KLEBSIELLA PNEUMONIAE (A)  Final      Susceptibility   Klebsiella pneumoniae - MIC*    AMPICILLIN >=32 RESISTANT Resistant     CEFAZOLIN >=64 RESISTANT Resistant     CEFTRIAXONE >=64 RESISTANT Resistant     CIPROFLOXACIN >=4 RESISTANT Resistant     GENTAMICIN 2 SENSITIVE Sensitive     IMIPENEM 0.5 SENSITIVE Sensitive     NITROFURANTOIN 256 RESISTANT Resistant     TRIMETH/SULFA >=320 RESISTANT Resistant     AMPICILLIN/SULBACTAM >=32 RESISTANT Resistant     PIP/TAZO >=128 RESISTANT Resistant     Extended ESBL POSITIVE Resistant     * >=100,000 COLONIES/mL KLEBSIELLA PNEUMONIAE  Culture, blood (routine x 2)     Status: None   Collection Time: 08/12/17  3:00 PM  Result Value Ref Range Status   Specimen Description BLOOD LEFT FOREARM  Final   Special Requests   Final    BOTTLES DRAWN AEROBIC AND ANAEROBIC Blood Culture adequate volume   Culture   Final    NO GROWTH 5 DAYS Performed at [facility], [address] [place], [place] [address]    Report Status 08/17/2017 FINAL  Final  Culture, blood (routine x 2)     Status: None   Collection Time: 08/12/17  3:10 PM  Result Value Ref Range Status   Specimen Description BLOOD LEFT HAND  Final   Special Requests   Final    BOTTLES DRAWN AEROBIC AND ANAEROBIC Blood Culture results may not be optimal due to an inadequate volume of blood received in culture bottles   Culture   Final    NO GROWTH 5 DAYS Performed at [facility], [address], [place], [place] [address]    Report Status 08/17/2017 FINAL  Final  Culture, blood (routine x  2)     Status: None (Preliminary result)   Collection Time: 08/22/17 12:51 PM  Result Value Ref Range Status   Specimen Description BLOOD BLOOD LEFT HAND  Final   Special Requests   Final    BOTTLES DRAWN AEROBIC AND ANAEROBIC Blood Culture adequate volume   Culture  Setup Time   Final    GRAM POSITIVE COCCI IN BOTH AEROBIC AND ANAEROBIC BOTTLES CRITICAL VALUE NOTED.  VALUE IS CONSISTENT WITH PREVIOUSLY REPORTED AND CALLED VALUE. Performed at [facility], [address] [place], [place] [address]    Culture GRAM POSITIVE COCCI  Final   Report Status PENDING  Incomplete  Culture, blood (routine x 2)     Status: Abnormal (Preliminary result)   Collection Time: 08/22/17  1:04 PM  Result Value Ref Range Status   Specimen Description BLOOD OUTSIDE SERVICE OPLAB  Final   Special Requests   Final    BOTTLES DRAWN AEROBIC AND ANAEROBIC Blood Culture adequate volume   Culture  Setup Time   Final    GRAM POSITIVE  COCCI IN BOTH AEROBIC AND ANAEROBIC BOTTLES CRITICAL RESULT CALLED TO, READ BACK BY AND VERIFIED WITH: RN Chalmers Cater [phone] MLM Performed at [facility], [address] [place], [place] [address]    Culture STAPHYLOCOCCUS SPECIES (COAGULASE NEGATIVE) (A)  Final   Report Status PENDING  Incomplete  Blood Culture ID Panel (Reflexed)     Status: Abnormal   Collection Time: 08/22/17  1:04 PM  Result Value Ref Range Status   Enterococcus species NOT DETECTED NOT DETECTED Final   Listeria monocytogenes NOT DETECTED NOT DETECTED Final   Staphylococcus species DETECTED (A) NOT DETECTED Final    Comment: Methicillin (oxacillin) resistant coagulase negative staphylococcus. Possible blood culture contaminant (unless isolated from more than one blood culture draw or clinical case suggests pathogenicity). No antibiotic treatment is indicated for blood  culture contaminants. CRITICAL RESULT CALLED TO, READ BACK BY AND VERIFIED WITH: RN K CADET [phone] MLM     Staphylococcus aureus NOT DETECTED NOT DETECTED Final   Methicillin resistance DETECTED (A) NOT DETECTED Final    Comment: CRITICAL RESULT CALLED TO, READ BACK BY AND VERIFIED WITH: RN K CADET [phone] MLM    Streptococcus species NOT DETECTED NOT DETECTED Final   Streptococcus agalactiae NOT DETECTED NOT DETECTED Final   Streptococcus pneumoniae NOT DETECTED NOT DETECTED Final   Streptococcus pyogenes NOT DETECTED NOT DETECTED Final   Acinetobacter baumannii NOT DETECTED NOT DETECTED Final   Enterobacteriaceae species NOT DETECTED NOT DETECTED Final   Enterobacter cloacae complex NOT DETECTED NOT DETECTED Final   Escherichia coli NOT DETECTED NOT DETECTED Final   Klebsiella oxytoca NOT DETECTED NOT DETECTED Final   Klebsiella pneumoniae NOT DETECTED NOT DETECTED Final   Proteus species NOT DETECTED NOT DETECTED Final   Serratia marcescens NOT DETECTED NOT DETECTED Final   Haemophilus influenzae NOT DETECTED NOT DETECTED Final   Neisseria meningitidis NOT DETECTED NOT DETECTED Final   Pseudomonas aeruginosa NOT DETECTED NOT DETECTED Final   Candida albicans NOT DETECTED NOT DETECTED Final   Candida glabrata NOT DETECTED NOT DETECTED Final   Candida krusei NOT DETECTED NOT DETECTED Final   Candida parapsilosis NOT DETECTED NOT DETECTED Final   Candida tropicalis NOT DETECTED NOT DETECTED Final    Comment: Performed at [facility], [address], [place], [place] [address]    Coagulation Studies: No results for input(s): LABPROT, INR in the last 72 hours.  Urinalysis: No results for input(s): COLORURINE, LABSPEC, PHURINE, GLUCOSEU, HGBUR, BILIRUBINUR, KETONESUR, PROTEINUR, UROBILINOGEN, NITRITE, LEUKOCYTESUR in the last 72 hours.  Invalid input(s): APPERANCEUR    Imaging: No results found.   Medications:     lidocaine (PF)  Assessment/ Plan:  62 y.o. 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   1.  Severe acute renal failure. 2.  Hyponatremia, Na 124 3.  Acute respiratory failure, currently on ventilator 4.  Anemia unspecified, Hgb down to 7.3 5.  Severe protein calorie malnutrition with severe emaciation, albumin 1.4 6.  Hyperkalemia, k up to 6. 7.  Multiorganism sepsis.  8.  Hypotension.   Plan: Patient remains critically ill at the moment.  Serum sodium down to  124 and potassium up to 6.0.  He continues to require ventilatory support.  He underwent dialysis today therefore suspect that serum potassium has improved.  In addition his malnutrition appears to be worse with an albumin down to 1.4 despite ongoing TPN infusion.  Patient also required pressor therapy during dialysis to help maintain blood pressure.  He continues to have a very guarded prognosis.   LOS: 0 Anjalee Cope 7/8/20192:47 PM

## 2017-08-24 NOTE — Progress Notes (Signed)
Pulmonary Critical Care Medicine [facility] [place]   PULMONARY SERVICE  PROGRESS NOTE  Date of Service: 08/24/2017  Melvin Kelley  JYN:[id number]  DOB: 10/29/1955   DOA: 06/05/2017  Referring Physician: Carron Curie, MD  HPI: Melvin Kelley is a 62 y.o. male seen for follow up of Acute on Chronic Respiratory Failure.  Patient has been having some issues with blood pressure.  Was started on levo fed and this is being titrated to maintain his blood pressure.  Remains on the ventilator and full support currently is on assist control mode has been on 40% oxygen.  Continues to require dialysis  Medications: Reviewed on Rounds  Physical Exam:  Vitals: Temperature 97.3 pulse 87 respiratory rate 16 blood pressure 113/51 saturations 94%  Ventilator Settings mode of ventilation assist control FiO2 40% tidal volume 471 PEEP 5  . General: Comfortable at this time . Eyes: Grossly normal lids, irises & conjunctiva . ENT: grossly tongue is normal . Neck: no obvious mass . Cardiovascular: S1 S2 normal no gallop . Respiratory: No rhonchi or rales are noted . Abdomen: soft . Skin: no rash seen on limited exam . Musculoskeletal: not rigid . Psychiatric:unable to assess . Neurologic: no seizure no involuntary movements         Lab Data:   Basic Metabolic Panel: Recent Labs  Lab 08/17/17 2358 08/19/17 0647 08/21/17 0000 08/21/17 0559 08/23/17 2343 08/24/17 0618  NA  --  126*  --  127*  --  124*  K  --  4.8  --  4.0  --  6.0*  CL  --  93*  --  95*  --  95*  CO2  --  27  --  26  --  22  GLUCOSE  --  193*  --  230*  --  287*  BUN  --  44*  --  37*  --  62*  CREATININE  --  1.87*  --  1.81*  --  2.99*  CALCIUM  --  7.5*  --  8.1*  --  7.7*  MG 1.7  --  1.9  --  1.9  --   PHOS  --  5.9*  --  4.1  --  7.3*    Liver Function Tests: Recent Labs  Lab 08/19/17 0647 08/21/17 0559 08/24/17 0618  ALBUMIN 1.6* 1.6* 1.4*   No results for input(s): LIPASE, AMYLASE in the  last 168 hours. No results for input(s): AMMONIA in the last 168 hours.  CBC: Recent Labs  Lab 08/20/17 2157 08/21/17 0559 08/21/17 1035 08/22/17 1251 08/24/17 0618  WBC 15.2* 13.3* 12.3* 21.4* 22.3*  HGB 9.0* 8.2* 8.6* 7.8* 7.3*  HCT 28.3* 26.5* 27.5* 25.5* 23.1*  MCV 90.1 91.1 90.8 92.7 89.9  PLT 40* 28* 29* 60* 36*    Cardiac Enzymes: No results for input(s): CKTOTAL, CKMB, CKMBINDEX, TROPONINI in the last 168 hours.  BNP (last 3 results) No results for input(s): BNP in the last 8760 hours.  ProBNP (last 3 results) No results for input(s): PROBNP in the last 8760 hours.  Radiological Exams: No results found.  Assessment/Plan Active Problems:   Cardiac arrest (HCC)   Acute on chronic respiratory failure (HCC)   CHF (congestive heart failure) (HCC)   Obstructive sleep apnea   Acute kidney injury (HCC)   1. Acute on chronic respiratory failure with hypoxia obviously at this point patient is not weaning well we will continue with full support continue pulmonary toilet secretion management. 2. Hypotension patient  is on levo fed will titrate as tolerated. 3. End-stage renal disease on renal dialysis replacement therapy.  Followed by nephrology will continue supportive care 4. Congestive heart failure prognosis guarded we will monitor 5. Status post cardiac arrest at baseline   I have personally seen and evaluated the patient, evaluated laboratory and imaging results, formulated the assessment and plan and placed orders. The Patient requires high complexity decision making for assessment and support.  Case was discussed on Rounds with the Respiratory Therapy Staff total time 35 minutes review of the medical record and management of titration of the drips  Yevonne Pax, MD [facility] Pulmonary Critical Care Medicine Sleep Medicine

## 2017-08-25 LAB — CULTURE, BLOOD (ROUTINE X 2)
SPECIAL REQUESTS: ADEQUATE
Special Requests: ADEQUATE

## 2017-08-25 NOTE — Progress Notes (Signed)
Pulmonary Critical Care Medicine [facility]   PULMONARY SERVICE  PROGRESS NOTE  Date of Service: 08/25/2017  Melvin Kelley  GNF:[id number]  DOB: 06-16-1955   DOA: 05/28/2017  Referring Physician: Carron Curie, MD  HPI: Melvin Kelley is a 62 y.o. male seen for follow up of Acute on Chronic Respiratory Failure.  Remains on full support currently is on assist control mode on 40% oxygen  Medications: Reviewed on Rounds  Physical Exam:  Vitals: Temperature 96.4 pulse 79 respiratory rate 12 blood pressure 95/67 saturation 96%  Ventilator Settings on assist control FiO2 40% tidal volume 491 PEEP 5  . General: Comfortable at this time . Eyes: Grossly normal lids, irises & conjunctiva . ENT: grossly tongue is normal . Neck: no obvious mass . Cardiovascular: S1 S2 normal no gallop . Respiratory: No rhonchi or rales . Abdomen: soft . Skin: no rash seen on limited exam . Musculoskeletal: not rigid . Psychiatric:unable to assess . Neurologic: no seizure no involuntary movements         Lab Data:   Basic Metabolic Panel: Recent Labs  Lab 08/19/17 0647 08/21/17 0000 08/21/17 0559 08/23/17 2343 08/24/17 0618  NA 126*  --  127*  --  124*  K 4.8  --  4.0  --  6.0*  CL 93*  --  95*  --  95*  CO2 27  --  26  --  22  GLUCOSE 193*  --  230*  --  287*  BUN 44*  --  37*  --  62*  CREATININE 1.87*  --  1.81*  --  2.99*  CALCIUM 7.5*  --  8.1*  --  7.7*  MG  --  1.9  --  1.9  --   PHOS 5.9*  --  4.1  --  7.3*    Liver Function Tests: Recent Labs  Lab 08/19/17 0647 08/21/17 0559 08/24/17 0618  ALBUMIN 1.6* 1.6* 1.4*   No results for input(s): LIPASE, AMYLASE in the last 168 hours. No results for input(s): AMMONIA in the last 168 hours.  CBC: Recent Labs  Lab 08/20/17 2157 08/21/17 0559 08/21/17 1035 08/22/17 1251 08/24/17 0618  WBC 15.2* 13.3* 12.3* 21.4* 22.3*  HGB 9.0* 8.2* 8.6* 7.8* 7.3*  HCT 28.3* 26.5* 27.5* 25.5* 23.1*  MCV 90.1 91.1  90.8 92.7 89.9  PLT 40* 28* 29* 60* 36*    Cardiac Enzymes: No results for input(s): CKTOTAL, CKMB, CKMBINDEX, TROPONINI in the last 168 hours.  BNP (last 3 results) No results for input(s): BNP in the last 8760 hours.  ProBNP (last 3 results) No results for input(s): PROBNP in the last 8760 hours.  Radiological Exams: Dg Abd Portable 1v  Result Date: 08/24/2017 CLINICAL DATA:  Central catheter placement via femoral approach EXAM: PORTABLE ABDOMEN - 1 VIEW COMPARISON:  August 08, 2017 FINDINGS: Femoral catheter tip is at the T9 level, presumably in the inferior vena cava. There is generalized bowel dilatation. No free air evident. There is postoperative change in the abdomen. There is a 6 mm calcification in the mid right abdomen, a likely right renal calculus. IMPRESSION: Catheter tip is at the level of T9, presumably in the inferior vena cava. Persistent generalized bowel dilatation. No free air. Probable calculus in right kidney measuring approximately 1 cm. Electronically Signed   By: Bretta Bang III M.D.   On: 08/24/2017 15:13    Assessment/Plan Active Problems:   Cardiac arrest (HCC)   Acute on chronic respiratory failure (HCC)  CHF (congestive heart failure) (HCC)   Obstructive sleep apnea   Acute kidney injury (HCC)   1. Acute on chronic respiratory failure with hypoxia remains on the ventilator not we will continue secretion management pulmonary toilet. 2. Hypotension on Levophed patient blood pressure has been tenuous. 3. Obstructive sleep apnea nonissue 4. Acute renal failure followed by nephrology for dialysis 5. Cardiac arrest rhythm has been stable prognosis poor   I have personally seen and evaluated the patient, evaluated laboratory and imaging results, formulated the assessment and plan and placed orders. The Patient requires high complexity decision making for assessment and support.  Case was discussed on Rounds with the Respiratory Therapy Staff  Yevonne Pax, MD [facility] Pulmonary Critical Care Medicine Sleep Medicine

## 2017-08-26 ENCOUNTER — Other Ambulatory Visit (HOSPITAL_COMMUNITY): Payer: Self-pay

## 2017-08-26 LAB — RENAL FUNCTION PANEL
ALBUMIN: 1.4 g/dL — AB (ref 3.5–5.0)
Anion gap: 8 (ref 5–15)
BUN: 57 mg/dL — AB (ref 6–20)
CO2: 22 mmol/L (ref 22–32)
CREATININE: 3.08 mg/dL — AB (ref 0.61–1.24)
Calcium: 7.5 mg/dL — ABNORMAL LOW (ref 8.9–10.3)
Chloride: 92 mmol/L — ABNORMAL LOW (ref 98–111)
GFR calc Af Amer: 24 mL/min — ABNORMAL LOW (ref >60)
GFR calc non Af Amer: 20 mL/min — ABNORMAL LOW (ref >60)
GLUCOSE: 245 mg/dL — AB (ref 70–99)
Phosphorus: 7.1 mg/dL — ABNORMAL HIGH (ref 2.5–4.6)
Potassium: 6.7 mmol/L (ref 3.5–5.1)
SODIUM: 122 mmol/L — AB (ref 135–145)

## 2017-08-26 LAB — PREPARE RBC (CROSSMATCH)

## 2017-08-26 LAB — VANCOMYCIN, TROUGH
Vancomycin Tr: 17 ug/mL (ref 15–20)
Vancomycin Tr: 16 ug/mL (ref 15–20)

## 2017-08-26 LAB — CBC
HCT: 19.7 % — ABNORMAL LOW (ref 39.0–52.0)
HEMOGLOBIN: 6.1 g/dL — AB (ref 13.0–17.0)
MCH: 29 pg (ref 26.0–34.0)
MCHC: 31 g/dL (ref 30.0–36.0)
MCV: 93.8 fL (ref 78.0–100.0)
PLATELETS: 30 10*3/uL — AB (ref 150–400)
RBC: 2.1 MIL/uL — ABNORMAL LOW (ref 4.22–5.81)
RDW: 23.5 % — AB (ref 11.5–15.5)
WBC: 21 10*3/uL — ABNORMAL HIGH (ref 4.0–10.5)

## 2017-08-26 NOTE — Progress Notes (Signed)
[facility]  ROUNDING NOTE   Subjective:  Patient seen and evaluated during hemodialysis. Patient has significant right-sided neck swelling.  This appears to be new finding.  Objective:  Vital signs in last 24 hours:  Temperature 96.8 pulse 94 respirations 22 blood pressure 112/58  Physical Exam: General: Critically ill appearing  Head: Severe temporal wasting, NG in place  Eyes: Anicteric  Neck: Tracheostomy in place, right sided neck swelling  Lungs:  Bilateral rhonchi, vent assisted  Heart: S1S2 no rubs  Abdomen:  Soft, nontender, bowel sounds present  Extremities: 3+ peripheral edema.  Neurologic: Awake, not following commands  Skin: Ulcers on feet noted  Access: L IJ temporary dialysis catheter    Basic Metabolic Panel: Recent Labs  Lab 08/21/17 0000 08/21/17 0559 08/23/17 2343 08/24/17 0618 08/26/17 0646  NA  --  127*  --  124* 122*  K  --  4.0  --  6.0* 6.7*  CL  --  95*  --  95* 92*  CO2  --  26  --  22 22  GLUCOSE  --  230*  --  287* 245*  BUN  --  37*  --  62* 57*  CREATININE  --  1.81*  --  2.99* 3.08*  CALCIUM  --  8.1*  --  7.7* 7.5*  MG 1.9  --  1.9  --   --   PHOS  --  4.1  --  7.3* 7.1*    Liver Function Tests: Recent Labs  Lab 08/21/17 0559 08/24/17 0618 08/26/17 0646  ALBUMIN 1.6* 1.4* 1.4*   No results for input(s): LIPASE, AMYLASE in the last 168 hours. No results for input(s): AMMONIA in the last 168 hours.  CBC: Recent Labs  Lab 08/21/17 0559 08/21/17 1035 08/22/17 1251 08/24/17 0618 08/26/17 0646  WBC 13.3* 12.3* 21.4* 22.3* 21.0*  HGB 8.2* 8.6* 7.8* 7.3* 6.1*  HCT 26.5* 27.5* 25.5* 23.1* 19.7*  MCV 91.1 90.8 92.7 89.9 93.8  PLT 28* 29* 60* 36* 30*    Cardiac Enzymes: No results for input(s): CKTOTAL, CKMB, CKMBINDEX, TROPONINI in the last 168 hours.  BNP: Invalid input(s): POCBNP  CBG: No results for input(s): GLUCAP in the last 168 hours.  Microbiology: Results for orders placed or performed during  the hospital encounter of 06/08/2017  Culture, Urine     Status: Abnormal   Collection Time: 07/29/17 12:20 PM  Result Value Ref Range Status   Specimen Description URINE, RANDOM  Final   Special Requests NONE  Final   Culture (A)  Final    >=100,000 COLONIES/mL ESCHERICHIA COLI >=100,000 COLONIES/mL KLEBSIELLA PNEUMONIAE Confirmed Extended Spectrum Beta-Lactamase Producer (ESBL).  In bloodstream infections from ESBL organisms, carbapenems are preferred over piperacillin/tazobactam. They are shown to have a lower risk of mortality. Performed at [facility], [address], [place], [place] [address]    Report Status 08/01/2017 FINAL  Final   Organism ID, Bacteria ESCHERICHIA COLI (A)  Final   Organism ID, Bacteria KLEBSIELLA PNEUMONIAE (A)  Final      Susceptibility   Escherichia coli - MIC*    AMPICILLIN >=32 RESISTANT Resistant     CEFAZOLIN <=4 SENSITIVE Sensitive     CEFTRIAXONE <=1 SENSITIVE Sensitive     CIPROFLOXACIN <=0.25 SENSITIVE Sensitive     GENTAMICIN <=1 SENSITIVE Sensitive     IMIPENEM <=0.25 SENSITIVE Sensitive     NITROFURANTOIN <=16 SENSITIVE Sensitive     TRIMETH/SULFA >=320 RESISTANT Resistant     AMPICILLIN/SULBACTAM  16 INTERMEDIATE Intermediate     PIP/TAZO <=4 SENSITIVE Sensitive     Extended ESBL NEGATIVE Sensitive     * >=100,000 COLONIES/mL ESCHERICHIA COLI   Klebsiella pneumoniae - MIC*    AMPICILLIN >=32 RESISTANT Resistant     CEFAZOLIN >=64 RESISTANT Resistant     CEFTRIAXONE >=64 RESISTANT Resistant     CIPROFLOXACIN 2 INTERMEDIATE Intermediate     GENTAMICIN <=1 SENSITIVE Sensitive     IMIPENEM <=0.25 SENSITIVE Sensitive     NITROFURANTOIN 32 SENSITIVE Sensitive     TRIMETH/SULFA >=320 RESISTANT Resistant     AMPICILLIN/SULBACTAM >=32 RESISTANT Resistant     PIP/TAZO 32 INTERMEDIATE Intermediate     Extended ESBL POSITIVE Resistant     * >=100,000 COLONIES/mL KLEBSIELLA PNEUMONIAE  C difficile quick scan w PCR reflex     Status: Abnormal    Collection Time: 07/29/17 12:24 PM  Result Value Ref Range Status   C Diff antigen POSITIVE (A) NEGATIVE Final   C Diff toxin NEGATIVE NEGATIVE Final   C Diff interpretation Results are indeterminate. See PCR results.  Final    Comment: Performed at [facility], [address] [place], [place] [address]  C. Diff by PCR, Reflexed     Status: None   Collection Time: 07/29/17 12:24 PM  Result Value Ref Range Status   Toxigenic C. Difficile by PCR NEGATIVE NEGATIVE Final    Comment: Patient is colonized with non toxigenic C. difficile. May not need treatment unless significant symptoms are present. Performed at [facility], [address], [place], [place] [address]   Culture, blood (routine x 2)     Status: Abnormal   Collection Time: 07/29/17 12:37 PM  Result Value Ref Range Status   Specimen Description BLOOD LEFT ANTECUBITAL  Final   Special Requests   Final    BOTTLES DRAWN AEROBIC ONLY Blood Culture results may not be optimal due to an inadequate volume of blood received in culture bottles   Culture  Setup Time   Final    AEROBIC BOTTLE ONLY GRAM NEGATIVE RODS CRITICAL RESULT CALLED TO, READ BACK BY AND VERIFIED WITH: Hartley Barefoot RN 07/30/17 0139 JDW    Culture (A)  Final    ESCHERICHIA COLI KLEBSIELLA PNEUMONIAE Confirmed Extended Spectrum Beta-Lactamase Producer (ESBL).  In bloodstream infections from ESBL organisms, carbapenems are preferred over piperacillin/tazobactam. They are shown to have a lower risk of mortality. Performed at [facility], [address], [place], [place] [address]    Report Status 08/01/2017 FINAL  Final   Organism ID, Bacteria ESCHERICHIA COLI  Final   Organism ID, Bacteria KLEBSIELLA PNEUMONIAE  Final      Susceptibility   Escherichia coli - MIC*    AMPICILLIN >=32 RESISTANT Resistant     CEFAZOLIN <=4 SENSITIVE Sensitive     CEFEPIME <=1 SENSITIVE Sensitive     CEFTAZIDIME <=1 SENSITIVE Sensitive     CEFTRIAXONE <=1  SENSITIVE Sensitive     CIPROFLOXACIN <=0.25 SENSITIVE Sensitive     GENTAMICIN <=1 SENSITIVE Sensitive     IMIPENEM <=0.25 SENSITIVE Sensitive     TRIMETH/SULFA >=320 RESISTANT Resistant     AMPICILLIN/SULBACTAM 16 INTERMEDIATE Intermediate     PIP/TAZO <=4 SENSITIVE Sensitive     Extended ESBL NEGATIVE Sensitive     * ESCHERICHIA COLI   Klebsiella pneumoniae - MIC*    AMPICILLIN >=32 RESISTANT Resistant     CEFAZOLIN >=64 RESISTANT Resistant     CEFEPIME 32 RESISTANT  Resistant     CEFTAZIDIME RESISTANT Resistant     CEFTRIAXONE >=64 RESISTANT Resistant     CIPROFLOXACIN 2 INTERMEDIATE Intermediate     GENTAMICIN <=1 SENSITIVE Sensitive     IMIPENEM <=0.25 SENSITIVE Sensitive     TRIMETH/SULFA >=320 RESISTANT Resistant     AMPICILLIN/SULBACTAM >=32 RESISTANT Resistant     PIP/TAZO 32 INTERMEDIATE Intermediate     Extended ESBL POSITIVE Resistant     * KLEBSIELLA PNEUMONIAE  Culture, blood (routine x 2)     Status: Abnormal   Collection Time: 07/29/17 12:37 PM  Result Value Ref Range Status   Specimen Description BLOOD LEFT ANTECUBITAL  Final   Special Requests   Final    BOTTLES DRAWN AEROBIC ONLY Blood Culture results may not be optimal due to an inadequate volume of blood received in culture bottles   Culture  Setup Time   Final    AEROBIC BOTTLE ONLY GRAM NEGATIVE RODS CRITICAL VALUE NOTED.  VALUE IS CONSISTENT WITH PREVIOUSLY REPORTED AND CALLED VALUE.    Culture (A)  Final    KLEBSIELLA PNEUMONIAE SUSCEPTIBILITIES PERFORMED ON PREVIOUS CULTURE WITHIN THE LAST 5 DAYS. Performed at [facility], [address] [place], [place] [address]    Report Status 08/01/2017 FINAL  Final  Blood Culture ID Panel (Reflexed)     Status: Abnormal   Collection Time: 07/29/17 12:37 PM  Result Value Ref Range Status   Enterococcus species NOT DETECTED NOT DETECTED Final   Listeria monocytogenes NOT DETECTED NOT DETECTED Final   Staphylococcus species NOT DETECTED NOT DETECTED  Final   Staphylococcus aureus NOT DETECTED NOT DETECTED Final   Streptococcus species NOT DETECTED NOT DETECTED Final   Streptococcus agalactiae NOT DETECTED NOT DETECTED Final   Streptococcus pneumoniae NOT DETECTED NOT DETECTED Final   Streptococcus pyogenes NOT DETECTED NOT DETECTED Final   Acinetobacter baumannii NOT DETECTED NOT DETECTED Final   Enterobacteriaceae species DETECTED (A) NOT DETECTED Final    Comment: CRITICAL RESULT CALLED TO, READ BACK BY AND VERIFIED WITH: Hartley Barefoot RN 07/30/17 0136 JDW    Enterobacter cloacae complex NOT DETECTED NOT DETECTED Final   Escherichia coli DETECTED (A) NOT DETECTED Final    Comment: CRITICAL RESULT CALLED TO, READ BACK BY AND VERIFIED WITH: Hartley Barefoot RN 07/30/17 0136 JDW    Klebsiella oxytoca NOT DETECTED NOT DETECTED Final   Klebsiella pneumoniae DETECTED (A) NOT DETECTED Final    Comment: CRITICAL RESULT CALLED TO, READ BACK BY AND VERIFIED WITH: Hartley Barefoot RN 07/30/17 0136 JDW    Proteus species NOT DETECTED NOT DETECTED Final   Serratia marcescens NOT DETECTED NOT DETECTED Final   Carbapenem resistance NOT DETECTED NOT DETECTED Final   Haemophilus influenzae NOT DETECTED NOT DETECTED Final   Neisseria meningitidis NOT DETECTED NOT DETECTED Final   Pseudomonas aeruginosa NOT DETECTED NOT DETECTED Final   Candida albicans NOT DETECTED NOT DETECTED Final   Candida glabrata NOT DETECTED NOT DETECTED Final   Candida krusei NOT DETECTED NOT DETECTED Final   Candida parapsilosis NOT DETECTED NOT DETECTED Final   Candida tropicalis NOT DETECTED NOT DETECTED Final    Comment: Performed at [facility], [address] [place], [place] [address]  C difficile quick scan w PCR reflex     Status: None   Collection Time: 08/12/17  9:30 AM  Result Value Ref Range Status   C Diff antigen NEGATIVE NEGATIVE Final   C Diff toxin NEGATIVE NEGATIVE Final   C Diff  interpretation No C. difficile detected.  Final  Culture, Urine     Status:  Abnormal   Collection Time: 08/12/17  1:43 PM  Result Value Ref Range Status   Specimen Description URINE, RANDOM  Final   Special Requests   Final    NONE Performed at [facility], [address] [place], [place] [address]    Culture >=100,000 COLONIES/mL KLEBSIELLA PNEUMONIAE (A)  Final   Report Status 08/14/2017 FINAL  Final   Organism ID, Bacteria KLEBSIELLA PNEUMONIAE (A)  Final      Susceptibility   Klebsiella pneumoniae - MIC*    AMPICILLIN >=32 RESISTANT Resistant     CEFAZOLIN >=64 RESISTANT Resistant     CEFTRIAXONE >=64 RESISTANT Resistant     CIPROFLOXACIN >=4 RESISTANT Resistant     GENTAMICIN 2 SENSITIVE Sensitive     IMIPENEM 0.5 SENSITIVE Sensitive     NITROFURANTOIN 256 RESISTANT Resistant     TRIMETH/SULFA >=320 RESISTANT Resistant     AMPICILLIN/SULBACTAM >=32 RESISTANT Resistant     PIP/TAZO >=128 RESISTANT Resistant     Extended ESBL POSITIVE Resistant     * >=100,000 COLONIES/mL KLEBSIELLA PNEUMONIAE  Culture, blood (routine x 2)     Status: None   Collection Time: 08/12/17  3:00 PM  Result Value Ref Range Status   Specimen Description BLOOD LEFT FOREARM  Final   Special Requests   Final    BOTTLES DRAWN AEROBIC AND ANAEROBIC Blood Culture adequate volume   Culture   Final    NO GROWTH 5 DAYS Performed at [facility], [address] [place], [place] [address]    Report Status 08/17/2017 FINAL  Final  Culture, blood (routine x 2)     Status: None   Collection Time: 08/12/17  3:10 PM  Result Value Ref Range Status   Specimen Description BLOOD LEFT HAND  Final   Special Requests   Final    BOTTLES DRAWN AEROBIC AND ANAEROBIC Blood Culture results may not be optimal due to an inadequate volume of blood received in culture bottles   Culture   Final    NO GROWTH 5 DAYS Performed at [facility], [address], [place], [place] [address]    Report Status 08/17/2017 FINAL  Final  Culture, blood (routine x 2)     Status: Abnormal    Collection Time: 08/22/17 12:51 PM  Result Value Ref Range Status   Specimen Description BLOOD BLOOD LEFT HAND  Final   Special Requests   Final    BOTTLES DRAWN AEROBIC AND ANAEROBIC Blood Culture adequate volume   Culture  Setup Time   Final    GRAM POSITIVE COCCI IN BOTH AEROBIC AND ANAEROBIC BOTTLES CRITICAL VALUE NOTED.  VALUE IS CONSISTENT WITH PREVIOUSLY REPORTED AND CALLED VALUE.    Culture (A)  Final    STAPHYLOCOCCUS SPECIES (COAGULASE NEGATIVE) SUSCEPTIBILITIES PERFORMED ON PREVIOUS CULTURE WITHIN THE LAST 5 DAYS. Performed at [facility], [address] [place], [place] [address]    Report Status 08/25/2017 FINAL  Final  Culture, blood (routine x 2)     Status: Abnormal   Collection Time: 08/22/17  1:04 PM  Result Value Ref Range Status   Specimen Description BLOOD OUTSIDE SERVICE OPLAB  Final   Special Requests   Final    BOTTLES DRAWN AEROBIC AND ANAEROBIC Blood Culture adequate volume   Culture  Setup Time   Final    GRAM POSITIVE COCCI IN BOTH AEROBIC  AND ANAEROBIC BOTTLES CRITICAL RESULT CALLED TO, READ BACK BY AND VERIFIED WITH: RN Chalmers Cater [phone] MLM Performed at [facility], [address], [place], [place] [address]    Culture STAPHYLOCOCCUS SPECIES (COAGULASE NEGATIVE) (A)  Final   Report Status 08/25/2017 FINAL  Final   Organism ID, Bacteria STAPHYLOCOCCUS SPECIES (COAGULASE NEGATIVE)  Final      Susceptibility   Staphylococcus species (coagulase negative) - MIC*    CIPROFLOXACIN >=8 RESISTANT Resistant     ERYTHROMYCIN >=8 RESISTANT Resistant     GENTAMICIN >=16 RESISTANT Resistant     OXACILLIN >=4 RESISTANT Resistant     TETRACYCLINE <=1 SENSITIVE Sensitive     VANCOMYCIN 2 SENSITIVE Sensitive     TRIMETH/SULFA <=10 SENSITIVE Sensitive     CLINDAMYCIN >=8 RESISTANT Resistant     RIFAMPIN <=0.5 SENSITIVE Sensitive     Inducible Clindamycin NEGATIVE Sensitive     * STAPHYLOCOCCUS SPECIES (COAGULASE NEGATIVE)  Blood Culture ID  Panel (Reflexed)     Status: Abnormal   Collection Time: 08/22/17  1:04 PM  Result Value Ref Range Status   Enterococcus species NOT DETECTED NOT DETECTED Final   Listeria monocytogenes NOT DETECTED NOT DETECTED Final   Staphylococcus species DETECTED (A) NOT DETECTED Final    Comment: Methicillin (oxacillin) resistant coagulase negative staphylococcus. Possible blood culture contaminant (unless isolated from more than one blood culture draw or clinical case suggests pathogenicity). No antibiotic treatment is indicated for blood  culture contaminants. CRITICAL RESULT CALLED TO, READ BACK BY AND VERIFIED WITH: RN K CADET [phone] MLM    Staphylococcus aureus NOT DETECTED NOT DETECTED Final   Methicillin resistance DETECTED (A) NOT DETECTED Final    Comment: CRITICAL RESULT CALLED TO, READ BACK BY AND VERIFIED WITH: RN K CADET [phone] MLM    Streptococcus species NOT DETECTED NOT DETECTED Final   Streptococcus agalactiae NOT DETECTED NOT DETECTED Final   Streptococcus pneumoniae NOT DETECTED NOT DETECTED Final   Streptococcus pyogenes NOT DETECTED NOT DETECTED Final   Acinetobacter baumannii NOT DETECTED NOT DETECTED Final   Enterobacteriaceae species NOT DETECTED NOT DETECTED Final   Enterobacter cloacae complex NOT DETECTED NOT DETECTED Final   Escherichia coli NOT DETECTED NOT DETECTED Final   Klebsiella oxytoca NOT DETECTED NOT DETECTED Final   Klebsiella pneumoniae NOT DETECTED NOT DETECTED Final   Proteus species NOT DETECTED NOT DETECTED Final   Serratia marcescens NOT DETECTED NOT DETECTED Final   Haemophilus influenzae NOT DETECTED NOT DETECTED Final   Neisseria meningitidis NOT DETECTED NOT DETECTED Final   Pseudomonas aeruginosa NOT DETECTED NOT DETECTED Final   Candida albicans NOT DETECTED NOT DETECTED Final   Candida glabrata NOT DETECTED NOT DETECTED Final   Candida krusei NOT DETECTED NOT DETECTED Final   Candida parapsilosis NOT DETECTED NOT DETECTED Final    Candida tropicalis NOT DETECTED NOT DETECTED Final    Comment: Performed at [facility], [address] [place], [place] [address]    Coagulation Studies: No results for input(s): LABPROT, INR in the last 72 hours.  Urinalysis: No results for input(s): COLORURINE, LABSPEC, PHURINE, GLUCOSEU, HGBUR, BILIRUBINUR, KETONESUR, PROTEINUR, UROBILINOGEN, NITRITE, LEUKOCYTESUR in the last 72 hours.  Invalid input(s): APPERANCEUR    Imaging: No results found.   Medications:     lidocaine (PF)  Assessment/ Plan:  62 y.o. male with a PMHx of hypercarbic respiratory, restrictive lung disease, obstructive sleep apnea, congestive heart failure ejection fraction 30-35%, dual-chamber ICD placement, Crohn's disease, short  bowel syndrome with history of TPN administration, hypertension, hypothyroidism, anemia, stage IV decubitus ulcer with osteomyelitis, multiple cardiac arrests who was admitted to [facility] on 06/07/17 for ongoing treatment of acute respiratory failure severe malnutrition, and acute renal failure.   1.  Severe acute renal failure. 2.  Hyponatremia, Na 122 3.  Acute respiratory failure, currently on ventilator 4.  Anemia unspecified, Hgb down to 6.1 5.  Severe protein calorie malnutrition with severe emaciation, albumin 1.4 6.  Hyperkalemia, k up to 6.7 7.  Multiorganism sepsis.  8.  Hypotension.   Plan: Patient seen and evaluated during hemodialysis.  He appears to be actively declining.  He has significant right-sided neck swelling and puffiness.  This was reviewed with hospitalist.  Patient has prior history of pneumomediastinum.  Defer further evaluation of this to Dr. Sharyon Medicus.  We plan to complete dialysis today.  He did receive blood transfusion during the dialysis treatment.  She is being dialyzed against a 1K bath as potassium quite high at 6.7.  Patient had very high risk for eminent deterioration.   LOS: 0 Melvin Kelley 7/10/20194:44 PM

## 2017-08-26 NOTE — Progress Notes (Signed)
Pulmonary Critical Care Medicine [facility] [place]   PULMONARY SERVICE  PROGRESS NOTE  Date of Service: 08/26/2017  Melvin Kelley  DZH:[id number]  DOB: May 12, 1955   DOA: 06/01/17  Referring Physician: Carron Curie, MD  HPI: Melvin Kelley is a 62 y.o. male seen for follow up of Acute on Chronic Respiratory Failure.  Patient continues to do poorly remains on assist control mode currently on 40% oxygen.  Patient PEEP is at 5 has also been on Levophed still for his blood pressure  Medications: Reviewed on Rounds  Physical Exam:  Vitals: Temperature 97.4 pulse 96 respiratory rate 14 blood pressure 103/64 saturations are 84%  Ventilator Settings mode of ventilation assist control FiO2 40% tidal volume 458 PEEP 5  . General: Comfortable at this time . Eyes: Grossly normal lids, irises & conjunctiva . ENT: grossly tongue is normal . Neck: no obvious mass . Cardiovascular: S1 S2 normal no gallop . Respiratory: Coarse with scattered rhonchi noted . Abdomen: soft . Skin: no rash seen on limited exam . Musculoskeletal: not rigid . Psychiatric:unable to assess . Neurologic: no seizure no involuntary movements         Lab Data:   Basic Metabolic Panel: Recent Labs  Lab 08/21/17 0000 08/21/17 0559 08/23/17 2343 08/24/17 0618 08/26/17 0646  NA  --  127*  --  124* 122*  K  --  4.0  --  6.0* 6.7*  CL  --  95*  --  95* 92*  CO2  --  26  --  22 22  GLUCOSE  --  230*  --  287* 245*  BUN  --  37*  --  62* 57*  CREATININE  --  1.81*  --  2.99* 3.08*  CALCIUM  --  8.1*  --  7.7* 7.5*  MG 1.9  --  1.9  --   --   PHOS  --  4.1  --  7.3* 7.1*    Liver Function Tests: Recent Labs  Lab 08/21/17 0559 08/24/17 0618 08/26/17 0646  ALBUMIN 1.6* 1.4* 1.4*   No results for input(s): LIPASE, AMYLASE in the last 168 hours. No results for input(s): AMMONIA in the last 168 hours.  CBC: Recent Labs  Lab 08/21/17 0559 08/21/17 1035 08/22/17 1251 08/24/17 0618  08/26/17 0646  WBC 13.3* 12.3* 21.4* 22.3* 21.0*  HGB 8.2* 8.6* 7.8* 7.3* 6.1*  HCT 26.5* 27.5* 25.5* 23.1* 19.7*  MCV 91.1 90.8 92.7 89.9 93.8  PLT 28* 29* 60* 36* 30*    Cardiac Enzymes: No results for input(s): CKTOTAL, CKMB, CKMBINDEX, TROPONINI in the last 168 hours.  BNP (last 3 results) No results for input(s): BNP in the last 8760 hours.  ProBNP (last 3 results) No results for input(s): PROBNP in the last 8760 hours.  Radiological Exams: No results found.  Assessment/Plan Active Problems:   Cardiac arrest (HCC)   Acute on chronic respiratory failure (HCC)   CHF (congestive heart failure) (HCC)   Obstructive sleep apnea   Acute kidney injury (HCC)   1. Acute on chronic respiratory failure with hypoxia patient's oxygen requirements are going up.  Will titrate oxygen to maintain sats greater than 88%.  Continue pulmonary toilet supportive care. 2. Hypotension remains on Levophed at this time continue supportive care patient's prognosis guarded. 3. Acute renal failure patient is on dialysis followed by nephrology. 4. Status post cardiac arrest grossly unchanged rhythm has been stable. 5. Congestive heart failure monitor fluid status as per nephrology recommendations.   I  have personally seen and evaluated the patient, evaluated laboratory and imaging results, formulated the assessment and plan and placed orders. The Patient requires high complexity decision making for assessment and support.  Case was discussed on Rounds with the Respiratory Therapy Staff  Allyne Gee, MD [facility] Pulmonary Critical Care Medicine Sleep Medicine

## 2017-08-27 LAB — BPAM RBC
Blood Product Expiration Date: 201908012359
Blood Product Expiration Date: 201908012359
ISSUE DATE / TIME: 201907101449
ISSUE DATE / TIME: 201907101545
UNIT TYPE AND RH: 7300
Unit Type and Rh: 7300

## 2017-08-27 LAB — BASIC METABOLIC PANEL
Anion gap: 10 (ref 5–15)
BUN: 39 mg/dL — ABNORMAL HIGH (ref 6–20)
CHLORIDE: 93 mmol/L — AB (ref 98–111)
CO2: 23 mmol/L (ref 22–32)
CREATININE: 2.32 mg/dL — AB (ref 0.61–1.24)
Calcium: 7.8 mg/dL — ABNORMAL LOW (ref 8.9–10.3)
GFR calc non Af Amer: 29 mL/min — ABNORMAL LOW (ref >60)
GFR, EST AFRICAN AMERICAN: 33 mL/min — AB (ref >60)
Glucose, Bld: 293 mg/dL — ABNORMAL HIGH (ref 70–99)
Potassium: 4.3 mmol/L (ref 3.5–5.1)
Sodium: 126 mmol/L — ABNORMAL LOW (ref 135–145)

## 2017-08-27 LAB — TYPE AND SCREEN
ABO/RH(D): B POS
ANTIBODY SCREEN: NEGATIVE
UNIT DIVISION: 0
Unit division: 0

## 2017-08-27 LAB — CBC
HEMATOCRIT: 28.2 % — AB (ref 39.0–52.0)
HEMOGLOBIN: 9.1 g/dL — AB (ref 13.0–17.0)
MCH: 29.2 pg (ref 26.0–34.0)
MCHC: 32.3 g/dL (ref 30.0–36.0)
MCV: 90.4 fL (ref 78.0–100.0)
Platelets: 29 10*3/uL — CL (ref 150–400)
RBC: 3.12 MIL/uL — AB (ref 4.22–5.81)
RDW: 20.8 % — AB (ref 11.5–15.5)
WBC: 19.5 10*3/uL — AB (ref 4.0–10.5)

## 2017-08-27 LAB — MAGNESIUM: Magnesium: 1.9 mg/dL (ref 1.7–2.4)

## 2017-08-27 NOTE — Progress Notes (Signed)
Pulmonary Critical Care Medicine [facility] [place]   PULMONARY SERVICE  PROGRESS NOTE  Date of Service: 08/27/2017  Melvin Kelley  TLX:[id number]  DOB: [birth year]   DOA: 06/01/2017  Referring Physician: Carron Curie, MD  HPI: Melvin Kelley is a 62 y.o. male seen for follow up of Acute on Chronic Respiratory Failure.  Patient remains on full vent support right now is on assist-control mode has been on 40% oxygen.  Secretions are copious.  Patient is not weaning at this time.  Still requiring Levophed for blood pressure issues.  Medications: Reviewed on Rounds  Physical Exam:  Vitals:   Temperature 97.3 degrees pulse of  84 respiratory rate 31 blood pressure 116/44  Ventilator Settings  Mode of ventilation assist-control FiO2 40% tidal volume 447 peep 5  . General: Comfortable at this time . Eyes: Grossly normal lids, irises & conjunctiva . ENT: grossly tongue is normal . Neck: no obvious mass . Cardiovascular: S1 S2 normal no gallop . Respiratory:  Coarse rhonchi noted bilaterally . Abdomen: soft . Skin: no rash seen on limited exam . Musculoskeletal: not rigid . Psychiatric:unable to assess . Neurologic: no seizure no involuntary movements         Lab Data:   Basic Metabolic Panel: Recent Labs  Lab 08/21/17 0000 08/21/17 0559 08/23/17 2343 08/24/17 0618 08/26/17 0646 08/27/17 1115  NA  --  127*  --  124* 122* 126*  K  --  4.0  --  6.0* 6.7* 4.3  CL  --  95*  --  95* 92* 93*  CO2  --  26  --  22 22 23   GLUCOSE  --  230*  --  287* 245* 293*  BUN  --  37*  --  62* 57* 39*  CREATININE  --  1.81*  --  2.99* 3.08* 2.32*  CALCIUM  --  8.1*  --  7.7* 7.5* 7.8*  MG 1.9  --  1.9  --   --  1.9  PHOS  --  4.1  --  7.3* 7.1*  --     Liver Function Tests: Recent Labs  Lab 08/21/17 0559 08/24/17 0618 08/26/17 0646  ALBUMIN 1.6* 1.4* 1.4*   No results for input(s): LIPASE, AMYLASE in the last 168 hours. No results for input(s): AMMONIA in the last  168 hours.  CBC: Recent Labs  Lab 08/21/17 1035 08/22/17 1251 08/24/17 0618 08/26/17 0646 08/27/17 1115  WBC 12.3* 21.4* 22.3* 21.0* 19.5*  HGB 8.6* 7.8* 7.3* 6.1* 9.1*  HCT 27.5* 25.5* 23.1* 19.7* 28.2*  MCV 90.8 92.7 89.9 93.8 90.4  PLT 29* 60* 36* 30* 29*    Cardiac Enzymes: No results for input(s): CKTOTAL, CKMB, CKMBINDEX, TROPONINI in the last 168 hours.  BNP (last 3 results) No results for input(s): BNP in the last 8760 hours.  ProBNP (last 3 results) No results for input(s): PROBNP in the last 8760 hours.  Radiological Exams: Dg Cervical Spine 1 View  Result Date: 08/26/2017 CLINICAL DATA:  Neck swelling EXAM: DG CERVICAL SPINE - 1 VIEW COMPARISON:  None. FINDINGS: Limited single AP view of the cervical spine. The upper cervical spine is excluded from this radiograph. Tracheostomy tube tip overlies the left tracheal air column at the thoracic inlet. Left internal jugular central venous catheter enters the SVC with the tip not seen on this image. Partially visualized 2 lead left subclavian ICD. No pneumothorax at the lung apices. No displaced fracture or focal osseous lesion. IMPRESSION: Limited single AP view  of the cervical spine. The upper cervical spine is excluded from this radiograph. Support structures as detailed. No displaced fracture or focal osseous lesion. Electronically Signed   By: Delbert Phenix M.D.   On: 08/26/2017 18:02   Dg Chest Port 1 View  Result Date: 08/26/2017 CLINICAL DATA:  Pneumothorax EXAM: PORTABLE CHEST 1 VIEW COMPARISON:  07/29/2017 chest radiograph. FINDINGS: Tracheostomy tube tip overlies left tracheal air column at the thoracic inlet. Enteric tube terminates in the body of the stomach. Stable configuration of 2 lead left subclavian ICD. Left internal jugular central venous catheter terminates in lower third of the SVC. Inferior approach catheter terminates in the medial right upper abdomen. Stable cardiomediastinal silhouette with normal heart  size. No pneumothorax. No pleural effusion. Low lung volumes. Patchy bibasilar lung opacities. Gaseous distention of the colon in the left upper quadrant. IMPRESSION: 1. Support structures as detailed.  No pneumothorax. 2. Low lung volumes with patchy bibasilar lung opacities, which could represent pneumonia, aspiration, scarring and/or atelectasis. Electronically Signed   By: Delbert Phenix M.D.   On: 08/26/2017 18:00    Assessment/Plan Active Problems:   Cardiac arrest (HCC)   Acute on chronic respiratory failure (HCC)   CHF (congestive heart failure) (HCC)   Obstructive sleep apnea   Acute kidney injury (HCC)   1.  acute on chronic Respiratory failure with hypoxia will continue with full vent support on assist-control ordered titrate oxygen as tolerated continue with aggressive pulmonary toilet prognosis guarded 2. Acute renal failure on hemodialysis continue with present management.   3. Obstructive sleep apnea grossly unchanged  4. Status post cardiac arrest at baseline 5. Congestive heart failure continue with supportive care monitor fluid status   I have personally seen and evaluated the patient, evaluated laboratory and imaging results, formulated the assessment and plan and placed orders. The Patient requires high complexity decision making for assessment and support.  Case was discussed on Rounds with the Respiratory Therapy Staff  Yevonne Pax, MD [facility] Pulmonary Critical Care Medicine Sleep Medicine

## 2017-08-28 LAB — RENAL FUNCTION PANEL
Albumin: 1.5 g/dL — ABNORMAL LOW (ref 3.5–5.0)
Anion gap: 9 (ref 5–15)
BUN: 62 mg/dL — ABNORMAL HIGH (ref 6–20)
CALCIUM: 7.8 mg/dL — AB (ref 8.9–10.3)
CHLORIDE: 88 mmol/L — AB (ref 98–111)
CO2: 26 mmol/L (ref 22–32)
CREATININE: 3.35 mg/dL — AB (ref 0.61–1.24)
GFR calc Af Amer: 21 mL/min — ABNORMAL LOW (ref >60)
GFR, EST NON AFRICAN AMERICAN: 19 mL/min — AB (ref >60)
Glucose, Bld: 266 mg/dL — ABNORMAL HIGH (ref 70–99)
Phosphorus: 7.2 mg/dL — ABNORMAL HIGH (ref 2.5–4.6)
Potassium: 4.6 mmol/L (ref 3.5–5.1)
SODIUM: 123 mmol/L — AB (ref 135–145)

## 2017-08-28 LAB — CBC
HCT: 26.1 % — ABNORMAL LOW (ref 39.0–52.0)
Hemoglobin: 8.4 g/dL — ABNORMAL LOW (ref 13.0–17.0)
MCH: 29.4 pg (ref 26.0–34.0)
MCHC: 32.2 g/dL (ref 30.0–36.0)
MCV: 91.3 fL (ref 78.0–100.0)
PLATELETS: 33 10*3/uL — AB (ref 150–400)
RBC: 2.86 MIL/uL — ABNORMAL LOW (ref 4.22–5.81)
RDW: 21 % — ABNORMAL HIGH (ref 11.5–15.5)
WBC: 27.4 10*3/uL — ABNORMAL HIGH (ref 4.0–10.5)

## 2017-08-28 NOTE — Progress Notes (Signed)
[facility]  ROUNDING NOTE   Subjective:  Patient remains critically ill at the moment. Still has considerable neck swelling. Hyperkalemia has corrected with dialysis. We have scheduled dialysis again for today.  Objective:  Vital signs in last 24 hours:  Temperature 96.8 pulse 94 respirations 22 blood pressure 112/58  Physical Exam: General: Critically ill appearing  Head: Severe temporal wasting, NG in place  Eyes: Anicteric  Neck: Tracheostomy in place, generalized neck swelling  Lungs:  Bilateral rhonchi, vent assisted  Heart: S1S2 no rubs  Abdomen:  Soft, nontender, bowel sounds present  Extremities: 3+ peripheral edema.  Neurologic: Awake, not following commands  Skin: Ulcers on feet noted  Access: L IJ temporary dialysis catheter    Basic Metabolic Panel: Recent Labs  Lab 08/23/17 2343 08/24/17 0618 08/26/17 0646 08/27/17 1115  NA  --  124* 122* 126*  K  --  6.0* 6.7* 4.3  CL  --  95* 92* 93*  CO2  --  22 22 23   GLUCOSE  --  287* 245* 293*  BUN  --  62* 57* 39*  CREATININE  --  2.99* 3.08* 2.32*  CALCIUM  --  7.7* 7.5* 7.8*  MG 1.9  --   --  1.9  PHOS  --  7.3* 7.1*  --     Liver Function Tests: Recent Labs  Lab 08/24/17 0618 08/26/17 0646  ALBUMIN 1.4* 1.4*   No results for input(s): LIPASE, AMYLASE in the last 168 hours. No results for input(s): AMMONIA in the last 168 hours.  CBC: Recent Labs  Lab 08/22/17 1251 08/24/17 0618 08/26/17 0646 08/27/17 1115  WBC 21.4* 22.3* 21.0* 19.5*  HGB 7.8* 7.3* 6.1* 9.1*  HCT 25.5* 23.1* 19.7* 28.2*  MCV 92.7 89.9 93.8 90.4  PLT 60* 36* 30* 29*    Cardiac Enzymes: No results for input(s): CKTOTAL, CKMB, CKMBINDEX, TROPONINI in the last 168 hours.  BNP: Invalid input(s): POCBNP  CBG: No results for input(s): GLUCAP in the last 168 hours.  Microbiology: Results for orders placed or performed during the hospital encounter of 05/26/2017  Culture, Urine     Status: Abnormal    Collection Time: 07/29/17 12:20 PM  Result Value Ref Range Status   Specimen Description URINE, RANDOM  Final   Special Requests NONE  Final   Culture (A)  Final    >=100,000 COLONIES/mL ESCHERICHIA COLI >=100,000 COLONIES/mL KLEBSIELLA PNEUMONIAE Confirmed Extended Spectrum Beta-Lactamase Producer (ESBL).  In bloodstream infections from ESBL organisms, carbapenems are preferred over piperacillin/tazobactam. They are shown to have a lower risk of mortality. Performed at [facility], [address] [place], [place] [address]    Report Status 08/01/2017 FINAL  Final   Organism ID, Bacteria ESCHERICHIA COLI (A)  Final   Organism ID, Bacteria KLEBSIELLA PNEUMONIAE (A)  Final      Susceptibility   Escherichia coli - MIC*    AMPICILLIN >=32 RESISTANT Resistant     CEFAZOLIN <=4 SENSITIVE Sensitive     CEFTRIAXONE <=1 SENSITIVE Sensitive     CIPROFLOXACIN <=0.25 SENSITIVE Sensitive     GENTAMICIN <=1 SENSITIVE Sensitive     IMIPENEM <=0.25 SENSITIVE Sensitive     NITROFURANTOIN <=16 SENSITIVE Sensitive     TRIMETH/SULFA >=320 RESISTANT Resistant     AMPICILLIN/SULBACTAM 16 INTERMEDIATE Intermediate     PIP/TAZO <=4 SENSITIVE Sensitive     Extended ESBL NEGATIVE Sensitive     * >=100,000 COLONIES/mL ESCHERICHIA COLI   Klebsiella pneumoniae - MIC*  AMPICILLIN >=32 RESISTANT Resistant     CEFAZOLIN >=64 RESISTANT Resistant     CEFTRIAXONE >=64 RESISTANT Resistant     CIPROFLOXACIN 2 INTERMEDIATE Intermediate     GENTAMICIN <=1 SENSITIVE Sensitive     IMIPENEM <=0.25 SENSITIVE Sensitive     NITROFURANTOIN 32 SENSITIVE Sensitive     TRIMETH/SULFA >=320 RESISTANT Resistant     AMPICILLIN/SULBACTAM >=32 RESISTANT Resistant     PIP/TAZO 32 INTERMEDIATE Intermediate     Extended ESBL POSITIVE Resistant     * >=100,000 COLONIES/mL KLEBSIELLA PNEUMONIAE  C difficile quick scan w PCR reflex     Status: Abnormal   Collection Time: 07/29/17 12:24 PM  Result Value Ref Range Status   C  Diff antigen POSITIVE (A) NEGATIVE Final   C Diff toxin NEGATIVE NEGATIVE Final   C Diff interpretation Results are indeterminate. See PCR results.  Final    Comment: Performed at [facility], [address], [place], [place] [address]  C. Diff by PCR, Reflexed     Status: None   Collection Time: 07/29/17 12:24 PM  Result Value Ref Range Status   Toxigenic C. Difficile by PCR NEGATIVE NEGATIVE Final    Comment: Patient is colonized with non toxigenic C. difficile. May not need treatment unless significant symptoms are present. Performed at [facility], [address] [place], [place] [address]   Culture, blood (routine x 2)     Status: Abnormal   Collection Time: 07/29/17 12:37 PM  Result Value Ref Range Status   Specimen Description BLOOD LEFT ANTECUBITAL  Final   Special Requests   Final    BOTTLES DRAWN AEROBIC ONLY Blood Culture results may not be optimal due to an inadequate volume of blood received in culture bottles   Culture  Setup Time   Final    AEROBIC BOTTLE ONLY GRAM NEGATIVE RODS CRITICAL RESULT CALLED TO, READ BACK BY AND VERIFIED WITH: Hartley Barefoot RN 07/30/17 0139 JDW    Culture (A)  Final    ESCHERICHIA COLI KLEBSIELLA PNEUMONIAE Confirmed Extended Spectrum Beta-Lactamase Producer (ESBL).  In bloodstream infections from ESBL organisms, carbapenems are preferred over piperacillin/tazobactam. They are shown to have a lower risk of mortality. Performed at [facility], [address], [place], [place] [address]    Report Status 08/01/2017 FINAL  Final   Organism ID, Bacteria ESCHERICHIA COLI  Final   Organism ID, Bacteria KLEBSIELLA PNEUMONIAE  Final      Susceptibility   Escherichia coli - MIC*    AMPICILLIN >=32 RESISTANT Resistant     CEFAZOLIN <=4 SENSITIVE Sensitive     CEFEPIME <=1 SENSITIVE Sensitive     CEFTAZIDIME <=1 SENSITIVE Sensitive     CEFTRIAXONE <=1 SENSITIVE Sensitive     CIPROFLOXACIN <=0.25 SENSITIVE Sensitive     GENTAMICIN  <=1 SENSITIVE Sensitive     IMIPENEM <=0.25 SENSITIVE Sensitive     TRIMETH/SULFA >=320 RESISTANT Resistant     AMPICILLIN/SULBACTAM 16 INTERMEDIATE Intermediate     PIP/TAZO <=4 SENSITIVE Sensitive     Extended ESBL NEGATIVE Sensitive     * ESCHERICHIA COLI   Klebsiella pneumoniae - MIC*    AMPICILLIN >=32 RESISTANT Resistant     CEFAZOLIN >=64 RESISTANT Resistant     CEFEPIME 32 RESISTANT Resistant     CEFTAZIDIME RESISTANT Resistant     CEFTRIAXONE >=64 RESISTANT Resistant     CIPROFLOXACIN 2 INTERMEDIATE Intermediate     GENTAMICIN <=1 SENSITIVE Sensitive     IMIPENEM <=  0.25 SENSITIVE Sensitive     TRIMETH/SULFA >=320 RESISTANT Resistant     AMPICILLIN/SULBACTAM >=32 RESISTANT Resistant     PIP/TAZO 32 INTERMEDIATE Intermediate     Extended ESBL POSITIVE Resistant     * KLEBSIELLA PNEUMONIAE  Culture, blood (routine x 2)     Status: Abnormal   Collection Time: 07/29/17 12:37 PM  Result Value Ref Range Status   Specimen Description BLOOD LEFT ANTECUBITAL  Final   Special Requests   Final    BOTTLES DRAWN AEROBIC ONLY Blood Culture results may not be optimal due to an inadequate volume of blood received in culture bottles   Culture  Setup Time   Final    AEROBIC BOTTLE ONLY GRAM NEGATIVE RODS CRITICAL VALUE NOTED.  VALUE IS CONSISTENT WITH PREVIOUSLY REPORTED AND CALLED VALUE.    Culture (A)  Final    KLEBSIELLA PNEUMONIAE SUSCEPTIBILITIES PERFORMED ON PREVIOUS CULTURE WITHIN THE LAST 5 DAYS. Performed at [facility], [address] [place], [place] [address]    Report Status 08/01/2017 FINAL  Final  Blood Culture ID Panel (Reflexed)     Status: Abnormal   Collection Time: 07/29/17 12:37 PM  Result Value Ref Range Status   Enterococcus species NOT DETECTED NOT DETECTED Final   Listeria monocytogenes NOT DETECTED NOT DETECTED Final   Staphylococcus species NOT DETECTED NOT DETECTED Final   Staphylococcus aureus NOT DETECTED NOT DETECTED Final   Streptococcus  species NOT DETECTED NOT DETECTED Final   Streptococcus agalactiae NOT DETECTED NOT DETECTED Final   Streptococcus pneumoniae NOT DETECTED NOT DETECTED Final   Streptococcus pyogenes NOT DETECTED NOT DETECTED Final   Acinetobacter baumannii NOT DETECTED NOT DETECTED Final   Enterobacteriaceae species DETECTED (A) NOT DETECTED Final    Comment: CRITICAL RESULT CALLED TO, READ BACK BY AND VERIFIED WITH: Hartley Barefoot RN 07/30/17 0136 JDW    Enterobacter cloacae complex NOT DETECTED NOT DETECTED Final   Escherichia coli DETECTED (A) NOT DETECTED Final    Comment: CRITICAL RESULT CALLED TO, READ BACK BY AND VERIFIED WITH: Hartley Barefoot RN 07/30/17 0136 JDW    Klebsiella oxytoca NOT DETECTED NOT DETECTED Final   Klebsiella pneumoniae DETECTED (A) NOT DETECTED Final    Comment: CRITICAL RESULT CALLED TO, READ BACK BY AND VERIFIED WITH: Hartley Barefoot RN 07/30/17 0136 JDW    Proteus species NOT DETECTED NOT DETECTED Final   Serratia marcescens NOT DETECTED NOT DETECTED Final   Carbapenem resistance NOT DETECTED NOT DETECTED Final   Haemophilus influenzae NOT DETECTED NOT DETECTED Final   Neisseria meningitidis NOT DETECTED NOT DETECTED Final   Pseudomonas aeruginosa NOT DETECTED NOT DETECTED Final   Candida albicans NOT DETECTED NOT DETECTED Final   Candida glabrata NOT DETECTED NOT DETECTED Final   Candida krusei NOT DETECTED NOT DETECTED Final   Candida parapsilosis NOT DETECTED NOT DETECTED Final   Candida tropicalis NOT DETECTED NOT DETECTED Final    Comment: Performed at [facility], [address], [place], [place] [address]  C difficile quick scan w PCR reflex     Status: None   Collection Time: 08/12/17  9:30 AM  Result Value Ref Range Status   C Diff antigen NEGATIVE NEGATIVE Final   C Diff toxin NEGATIVE NEGATIVE Final   C Diff interpretation No C. difficile detected.  Final  Culture, Urine     Status: Abnormal   Collection Time: 08/12/17  1:43 PM  Result Value Ref Range Status    Specimen Description URINE, RANDOM  Final   Special Requests   Final    NONE Performed at [facility], [address] [place], [place] [address]    Culture >=100,000 COLONIES/mL KLEBSIELLA PNEUMONIAE (A)  Final   Report Status 08/14/2017 FINAL  Final   Organism ID, Bacteria KLEBSIELLA PNEUMONIAE (A)  Final      Susceptibility   Klebsiella pneumoniae - MIC*    AMPICILLIN >=32 RESISTANT Resistant     CEFAZOLIN >=64 RESISTANT Resistant     CEFTRIAXONE >=64 RESISTANT Resistant     CIPROFLOXACIN >=4 RESISTANT Resistant     GENTAMICIN 2 SENSITIVE Sensitive     IMIPENEM 0.5 SENSITIVE Sensitive     NITROFURANTOIN 256 RESISTANT Resistant     TRIMETH/SULFA >=320 RESISTANT Resistant     AMPICILLIN/SULBACTAM >=32 RESISTANT Resistant     PIP/TAZO >=128 RESISTANT Resistant     Extended ESBL POSITIVE Resistant     * >=100,000 COLONIES/mL KLEBSIELLA PNEUMONIAE  Culture, blood (routine x 2)     Status: None   Collection Time: 08/12/17  3:00 PM  Result Value Ref Range Status   Specimen Description BLOOD LEFT FOREARM  Final   Special Requests   Final    BOTTLES DRAWN AEROBIC AND ANAEROBIC Blood Culture adequate volume   Culture   Final    NO GROWTH 5 DAYS Performed at [facility], [address], [place], [place] [address]    Report Status 08/17/2017 FINAL  Final  Culture, blood (routine x 2)     Status: None   Collection Time: 08/12/17  3:10 PM  Result Value Ref Range Status   Specimen Description BLOOD LEFT HAND  Final   Special Requests   Final    BOTTLES DRAWN AEROBIC AND ANAEROBIC Blood Culture results may not be optimal due to an inadequate volume of blood received in culture bottles   Culture   Final    NO GROWTH 5 DAYS Performed at [facility], [address], [place], [place] [address]    Report Status 08/17/2017 FINAL  Final  Culture, blood (routine x 2)     Status: Abnormal   Collection Time: 08/22/17 12:51 PM  Result Value Ref Range Status   Specimen  Description BLOOD BLOOD LEFT HAND  Final   Special Requests   Final    BOTTLES DRAWN AEROBIC AND ANAEROBIC Blood Culture adequate volume   Culture  Setup Time   Final    GRAM POSITIVE COCCI IN BOTH AEROBIC AND ANAEROBIC BOTTLES CRITICAL VALUE NOTED.  VALUE IS CONSISTENT WITH PREVIOUSLY REPORTED AND CALLED VALUE.    Culture (A)  Final    STAPHYLOCOCCUS SPECIES (COAGULASE NEGATIVE) SUSCEPTIBILITIES PERFORMED ON PREVIOUS CULTURE WITHIN THE LAST 5 DAYS. Performed at [facility], [address] [place], [place] [address]    Report Status 08/25/2017 FINAL  Final  Culture, blood (routine x 2)     Status: Abnormal   Collection Time: 08/22/17  1:04 PM  Result Value Ref Range Status   Specimen Description BLOOD OUTSIDE SERVICE OPLAB  Final   Special Requests   Final    BOTTLES DRAWN AEROBIC AND ANAEROBIC Blood Culture adequate volume   Culture  Setup Time   Final    GRAM POSITIVE COCCI IN BOTH AEROBIC AND ANAEROBIC BOTTLES CRITICAL RESULT CALLED TO, READ BACK BY AND VERIFIED WITH: RN Chalmers Cater [phone] MLM Performed at [facility], [address] [place], [place] [address]    Culture STAPHYLOCOCCUS SPECIES (  COAGULASE NEGATIVE) (A)  Final   Report Status 08/25/2017 FINAL  Final   Organism ID, Bacteria STAPHYLOCOCCUS SPECIES (COAGULASE NEGATIVE)  Final      Susceptibility   Staphylococcus species (coagulase negative) - MIC*    CIPROFLOXACIN >=8 RESISTANT Resistant     ERYTHROMYCIN >=8 RESISTANT Resistant     GENTAMICIN >=16 RESISTANT Resistant     OXACILLIN >=4 RESISTANT Resistant     TETRACYCLINE <=1 SENSITIVE Sensitive     VANCOMYCIN 2 SENSITIVE Sensitive     TRIMETH/SULFA <=10 SENSITIVE Sensitive     CLINDAMYCIN >=8 RESISTANT Resistant     RIFAMPIN <=0.5 SENSITIVE Sensitive     Inducible Clindamycin NEGATIVE Sensitive     * STAPHYLOCOCCUS SPECIES (COAGULASE NEGATIVE)  Blood Culture ID Panel (Reflexed)     Status: Abnormal   Collection Time: 08/22/17  1:04 PM   Result Value Ref Range Status   Enterococcus species NOT DETECTED NOT DETECTED Final   Listeria monocytogenes NOT DETECTED NOT DETECTED Final   Staphylococcus species DETECTED (A) NOT DETECTED Final    Comment: Methicillin (oxacillin) resistant coagulase negative staphylococcus. Possible blood culture contaminant (unless isolated from more than one blood culture draw or clinical case suggests pathogenicity). No antibiotic treatment is indicated for blood  culture contaminants. CRITICAL RESULT CALLED TO, READ BACK BY AND VERIFIED WITH: RN K CADET [phone] MLM    Staphylococcus aureus NOT DETECTED NOT DETECTED Final   Methicillin resistance DETECTED (A) NOT DETECTED Final    Comment: CRITICAL RESULT CALLED TO, READ BACK BY AND VERIFIED WITH: RN K CADET [phone] MLM    Streptococcus species NOT DETECTED NOT DETECTED Final   Streptococcus agalactiae NOT DETECTED NOT DETECTED Final   Streptococcus pneumoniae NOT DETECTED NOT DETECTED Final   Streptococcus pyogenes NOT DETECTED NOT DETECTED Final   Acinetobacter baumannii NOT DETECTED NOT DETECTED Final   Enterobacteriaceae species NOT DETECTED NOT DETECTED Final   Enterobacter cloacae complex NOT DETECTED NOT DETECTED Final   Escherichia coli NOT DETECTED NOT DETECTED Final   Klebsiella oxytoca NOT DETECTED NOT DETECTED Final   Klebsiella pneumoniae NOT DETECTED NOT DETECTED Final   Proteus species NOT DETECTED NOT DETECTED Final   Serratia marcescens NOT DETECTED NOT DETECTED Final   Haemophilus influenzae NOT DETECTED NOT DETECTED Final   Neisseria meningitidis NOT DETECTED NOT DETECTED Final   Pseudomonas aeruginosa NOT DETECTED NOT DETECTED Final   Candida albicans NOT DETECTED NOT DETECTED Final   Candida glabrata NOT DETECTED NOT DETECTED Final   Candida krusei NOT DETECTED NOT DETECTED Final   Candida parapsilosis NOT DETECTED NOT DETECTED Final   Candida tropicalis NOT DETECTED NOT DETECTED Final    Comment: Performed at  [facility], [address], [place], [place] [address]    Coagulation Studies: No results for input(s): LABPROT, INR in the last 72 hours.  Urinalysis: No results for input(s): COLORURINE, LABSPEC, PHURINE, GLUCOSEU, HGBUR, BILIRUBINUR, KETONESUR, PROTEINUR, UROBILINOGEN, NITRITE, LEUKOCYTESUR in the last 72 hours.  Invalid input(s): APPERANCEUR    Imaging: Dg Cervical Spine 1 View  Result Date: 08/26/2017 CLINICAL DATA:  Neck swelling EXAM: DG CERVICAL SPINE - 1 VIEW COMPARISON:  None. FINDINGS: Limited single AP view of the cervical spine. The upper cervical spine is excluded from this radiograph. Tracheostomy tube tip overlies the left tracheal air column at the thoracic inlet. Left internal jugular central venous catheter enters the SVC with the tip not seen on this image. Partially visualized 2 lead left subclavian ICD. No pneumothorax  at the lung apices. No displaced fracture or focal osseous lesion. IMPRESSION: Limited single AP view of the cervical spine. The upper cervical spine is excluded from this radiograph. Support structures as detailed. No displaced fracture or focal osseous lesion. Electronically Signed   By: Delbert Phenix M.D.   On: 08/26/2017 18:02   Dg Chest Port 1 View  Result Date: 08/26/2017 CLINICAL DATA:  Pneumothorax EXAM: PORTABLE CHEST 1 VIEW COMPARISON:  07/29/2017 chest radiograph. FINDINGS: Tracheostomy tube tip overlies left tracheal air column at the thoracic inlet. Enteric tube terminates in the body of the stomach. Stable configuration of 2 lead left subclavian ICD. Left internal jugular central venous catheter terminates in lower third of the SVC. Inferior approach catheter terminates in the medial right upper abdomen. Stable cardiomediastinal silhouette with normal heart size. No pneumothorax. No pleural effusion. Low lung volumes. Patchy bibasilar lung opacities. Gaseous distention of the colon in the left upper quadrant. IMPRESSION: 1. Support  structures as detailed.  No pneumothorax. 2. Low lung volumes with patchy bibasilar lung opacities, which could represent pneumonia, aspiration, scarring and/or atelectasis. Electronically Signed   By: Delbert Phenix M.D.   On: 08/26/2017 18:00     Medications:     lidocaine (PF)  Assessment/ Plan:  62 y.o. 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   1.  Severe acute renal failure. 2.  Hyponatremia, Na 126 3.  Acute respiratory failure, currently on ventilator 4.  Anemia unspecified, Hgb up to 9.1 post transfusion 5.  Severe protein calorie malnutrition with severe emaciation, albumin 1.9 6.  Hyperkalemia, K down to 4.3.  7.  Multiorganism sepsis.  8.  Hypotension.  9.  Thrombocytopenia, citrate catheter packing ordered.   Plan: Patient due for dialysis again today.  Orders have been prepared.  Many metabolic parameters improved since his last dialysis treatment.  Potassium down to 4.3.  Hemoglobin improved post transfusion as hemoglobin is up to 9.1.  Patient continues on TPN.  Overall prognosis quite guarded.   LOS: 0 Melvin Kelley 7/12/20194:20 PM

## 2017-08-28 NOTE — Progress Notes (Signed)
Pulmonary Critical Care Medicine [facility] [place]   PULMONARY SERVICE  PROGRESS NOTE  Date of Service: 08/28/2017  Melvin Kelley  SWH:[id number]  DOB: April 04, 1955   DOA: Jun 04, 2017  Referring Physician: Carron Curie, MD  HPI: Melvin Kelley is a 62 y.o. male seen for follow up of Acute on Chronic Respiratory Failure.  He is at baseline remains on the ventilator grossly unchanged.  Doing poorly prognostically.  Medications: Reviewed on Rounds  Physical Exam:  Vitals: Temperature 97.0 pulse 79 respiratory rate 21 blood pressure 9744 saturations 90%  Ventilator Settings mode of ventilation assist control FiO2 50% PEEP 5 tidal volume 457  . General: Comfortable at this time . Eyes: Grossly normal lids, irises & conjunctiva . ENT: grossly tongue is normal . Neck: no obvious mass . Cardiovascular: S1 S2 normal no gallop . Respiratory: Scattered rhonchi noted bilaterally . Abdomen: soft . Skin: no rash seen on limited exam . Musculoskeletal: not rigid . Psychiatric:unable to assess . Neurologic: no seizure no involuntary movements         Lab Data:   Basic Metabolic Panel: Recent Labs  Lab 08/23/17 2343 08/24/17 0618 08/26/17 0646 08/27/17 1115  NA  --  124* 122* 126*  K  --  6.0* 6.7* 4.3  CL  --  95* 92* 93*  CO2  --  22 22 23   GLUCOSE  --  287* 245* 293*  BUN  --  62* 57* 39*  CREATININE  --  2.99* 3.08* 2.32*  CALCIUM  --  7.7* 7.5* 7.8*  MG 1.9  --   --  1.9  PHOS  --  7.3* 7.1*  --     Liver Function Tests: Recent Labs  Lab 08/24/17 0618 08/26/17 0646  ALBUMIN 1.4* 1.4*   No results for input(s): LIPASE, AMYLASE in the last 168 hours. No results for input(s): AMMONIA in the last 168 hours.  CBC: Recent Labs  Lab 08/22/17 1251 08/24/17 0618 08/26/17 0646 08/27/17 1115  WBC 21.4* 22.3* 21.0* 19.5*  HGB 7.8* 7.3* 6.1* 9.1*  HCT 25.5* 23.1* 19.7* 28.2*  MCV 92.7 89.9 93.8 90.4  PLT 60* 36* 30* 29*    Cardiac Enzymes: No  results for input(s): CKTOTAL, CKMB, CKMBINDEX, TROPONINI in the last 168 hours.  BNP (last 3 results) No results for input(s): BNP in the last 8760 hours.  ProBNP (last 3 results) No results for input(s): PROBNP in the last 8760 hours.  Radiological Exams: Dg Cervical Spine 1 View  Result Date: 08/26/2017 CLINICAL DATA:  Neck swelling EXAM: DG CERVICAL SPINE - 1 VIEW COMPARISON:  None. FINDINGS: Limited single AP view of the cervical spine. The upper cervical spine is excluded from this radiograph. Tracheostomy tube tip overlies the left tracheal air column at the thoracic inlet. Left internal jugular central venous catheter enters the SVC with the tip not seen on this image. Partially visualized 2 lead left subclavian ICD. No pneumothorax at the lung apices. No displaced fracture or focal osseous lesion. IMPRESSION: Limited single AP view of the cervical spine. The upper cervical spine is excluded from this radiograph. Support structures as detailed. No displaced fracture or focal osseous lesion. Electronically Signed   By: Delbert Phenix M.D.   On: 08/26/2017 18:02   Dg Chest Port 1 View  Result Date: 08/26/2017 CLINICAL DATA:  Pneumothorax EXAM: PORTABLE CHEST 1 VIEW COMPARISON:  07/29/2017 chest radiograph. FINDINGS: Tracheostomy tube tip overlies left tracheal air column at the thoracic inlet. Enteric tube terminates in the  body of the stomach. Stable configuration of 2 lead left subclavian ICD. Left internal jugular central venous catheter terminates in lower third of the SVC. Inferior approach catheter terminates in the medial right upper abdomen. Stable cardiomediastinal silhouette with normal heart size. No pneumothorax. No pleural effusion. Low lung volumes. Patchy bibasilar lung opacities. Gaseous distention of the colon in the left upper quadrant. IMPRESSION: 1. Support structures as detailed.  No pneumothorax. 2. Low lung volumes with patchy bibasilar lung opacities, which could represent  pneumonia, aspiration, scarring and/or atelectasis. Electronically Signed   By: Delbert Phenix M.D.   On: 08/26/2017 18:00    Assessment/Plan Active Problems:   Cardiac arrest (HCC)   Acute on chronic respiratory failure (HCC)   CHF (congestive heart failure) (HCC)   Obstructive sleep apnea   Acute kidney injury (HCC)   1. Acute on chronic respiratory failure with hypoxia patient remains on full vent support.  The patient's not able to wean at this time. 2. Congestive heart failure continue with fluid management patient is on dialysis. 3. Acute renal failure on hemodialysis continue present management. 4. Status post cardiac arrest at baseline   I have personally seen and evaluated the patient, evaluated laboratory and imaging results, formulated the assessment and plan and placed orders. The Patient requires high complexity decision making for assessment and support.  Case was discussed on Rounds with the Respiratory Therapy Staff  Yevonne Pax, MD [facility] Pulmonary Critical Care Medicine Sleep Medicine

## 2017-08-30 LAB — CBC
HCT: 13.9 % — ABNORMAL LOW (ref 39.0–52.0)
HCT: 15.1 % — ABNORMAL LOW (ref 39.0–52.0)
Hemoglobin: 4.8 g/dL — CL (ref 13.0–17.0)
Hemoglobin: 4.9 g/dL — CL (ref 13.0–17.0)
MCH: 34.8 pg — ABNORMAL HIGH (ref 26.0–34.0)
MCH: 33.3 pg (ref 26.0–34.0)
MCHC: 34.5 g/dL (ref 30.0–36.0)
MCHC: 32.5 g/dL (ref 30.0–36.0)
MCV: 100.7 fL — ABNORMAL HIGH (ref 78.0–100.0)
MCV: 102.7 fL — ABNORMAL HIGH (ref 78.0–100.0)
PLATELETS: DECREASED 10*3/uL (ref 150–400)
PLATELETS: 55 10*3/uL — AB (ref 150–400)
RBC: 1.38 MIL/uL — ABNORMAL LOW (ref 4.22–5.81)
RBC: 1.47 MIL/uL — ABNORMAL LOW (ref 4.22–5.81)
RDW: 22.7 % — AB (ref 11.5–15.5)
RDW: 23 % — AB (ref 11.5–15.5)
WBC: 55.5 10*3/uL (ref 4.0–10.5)
WBC: 63.7 10*3/uL — AB (ref 4.0–10.5)

## 2017-08-30 LAB — BASIC METABOLIC PANEL
ANION GAP: 13 (ref 5–15)
BUN: 54 mg/dL — AB (ref 6–20)
CALCIUM: 7.2 mg/dL — AB (ref 8.9–10.3)
CO2: 19 mmol/L — ABNORMAL LOW (ref 22–32)
CREATININE: 2.97 mg/dL — AB (ref 0.61–1.24)
Chloride: 91 mmol/L — ABNORMAL LOW (ref 98–111)
GFR calc Af Amer: 25 mL/min — ABNORMAL LOW (ref >60)
GFR calc non Af Amer: 21 mL/min — ABNORMAL LOW (ref >60)
GLUCOSE: 266 mg/dL — AB (ref 70–99)
Potassium: 5.3 mmol/L — ABNORMAL HIGH (ref 3.5–5.1)
Sodium: 123 mmol/L — ABNORMAL LOW (ref 135–145)

## 2017-08-30 LAB — MAGNESIUM: MAGNESIUM: 2 mg/dL (ref 1.7–2.4)

## 2017-08-30 LAB — PREPARE RBC (CROSSMATCH)

## 2017-08-30 NOTE — Progress Notes (Signed)
Pulmonary Critical Care Medicine [facility] [place]   PULMONARY SERVICE  PROGRESS NOTE  Date of Service: 08/25/2017  Melvin Kelley  KTG:[id number]  DOB: 08-May-1955   DOA: 2017-06-21  Referring Physician: Carron Curie, MD  HPI: Melvin Kelley is a 62 y.o. male seen for follow up of Acute on Chronic Respiratory Failure.  Remains on the ventilator his oxygen requirements have gone up to 100%.  His blood pressure has been running low so therefore PEEP is not able to be increased  Medications: Reviewed on Rounds  Physical Exam:  Vitals: Temperature 97.3 pulse 74 respiratory rate 24 blood pressure 94/74 saturations 89%  Ventilator Settings mode of ventilation assist control FiO2 100% tidal volume 471 PEEP 5  . General: Comfortable at this time . Eyes: Grossly normal lids, irises & conjunctiva . ENT: grossly tongue is normal . Neck: no obvious mass . Cardiovascular: S1 S2 normal no gallop . Respiratory: No rhonchi or rales are noted . Abdomen: soft . Skin: no rash seen on limited exam . Musculoskeletal: not rigid . Psychiatric:unable to assess . Neurologic: no seizure no involuntary movements         Lab Data:   Basic Metabolic Panel: Recent Labs  Lab 08/23/17 2343 08/24/17 0618 08/26/17 0646 08/27/17 1115 08/28/17 1600 09/07/2017 0709  NA  --  124* 122* 126* 123* 123*  K  --  6.0* 6.7* 4.3 4.6 5.3*  CL  --  95* 92* 93* 88* 91*  CO2  --  22 22 23 26  19*  GLUCOSE  --  287* 245* 293* 266* 266*  BUN  --  62* 57* 39* 62* 54*  CREATININE  --  2.99* 3.08* 2.32* 3.35* 2.97*  CALCIUM  --  7.7* 7.5* 7.8* 7.8* 7.2*  MG 1.9  --   --  1.9  --  2.0  PHOS  --  7.3* 7.1*  --  7.2*  --     Liver Function Tests: Recent Labs  Lab 08/24/17 0618 08/26/17 0646 08/28/17 1600  ALBUMIN 1.4* 1.4* 1.5*   No results for input(s): LIPASE, AMYLASE in the last 168 hours. No results for input(s): AMMONIA in the last 168 hours.  CBC: Recent Labs  Lab 08/24/17 0618  08/26/17 0646 08/27/17 1115 08/28/17 1600  WBC 22.3* 21.0* 19.5* 27.4*  HGB 7.3* 6.1* 9.1* 8.4*  HCT 23.1* 19.7* 28.2* 26.1*  MCV 89.9 93.8 90.4 91.3  PLT 36* 30* 29* 33*    Cardiac Enzymes: No results for input(s): CKTOTAL, CKMB, CKMBINDEX, TROPONINI in the last 168 hours.  BNP (last 3 results) No results for input(s): BNP in the last 8760 hours.  ProBNP (last 3 results) No results for input(s): PROBNP in the last 8760 hours.  Radiological Exams: No results found.  Assessment/Plan Active Problems:   Cardiac arrest (HCC)   Acute on chronic respiratory failure (HCC)   CHF (congestive heart failure) (HCC)   Obstructive sleep apnea   Acute kidney injury (HCC)   1. Acute on chronic respiratory failure with hypoxia we will continue with full support on assist control mode patient oxygen requirements are increased patient's prognosis is poor unlikely to survive 2. Acute renal failure on dialysis suspect that he is not going to be able to tolerate the dialysis because of the blood pressure issues. 3. Congestive heart failure monitor fluid status 4. Cardiac arrest no arrhythmias are noted at this time still remains a full code   I have personally seen and evaluated the patient, evaluated laboratory and  imaging results, formulated the assessment and plan and placed orders. The Patient requires high complexity decision making for assessment and support.  Case was discussed on Rounds with the Respiratory Therapy Staff  Allyne Gee, MD [facility] Pulmonary Critical Care Medicine Sleep Medicine

## 2017-08-30 NOTE — Code Documentation (Signed)
  Patient Name: Melvin Kelley   MRN: [id number]   Date of Birth/ Sex: 1955-05-20 , male      Admission Date: 06-15-2017  Attending Provider: Carron Curie, MD  Primary Diagnosis: vent dependent respiratory failure  severe malnutrition   Indication: Pt was in his usual state of health until this PM, when he was noted to have lost pulses and had PEA. Code blue was subsequently called. At the time of arrival on scene, ACLS protocol was underway.   Technical Description:  - CPR performance duration:  41 minutes  - Was defibrillation or cardioversion used? No   - Was external pacer placed? No  - Was patient intubated pre/post CPR? Yes   Medications Administered: Y = Yes; Blank = No Amiodarone    Atropine  Y  Calcium  Y  Epinephrine  Y  Lidocaine    Magnesium    Norepinephrine    Phenylephrine    Sodium bicarbonate  Y  Vasopressin    Other    Post CPR evaluation:  - Final Status - Was patient successfully resuscitated ? No   Miscellaneous Information:  - Time of death:  Jun 25, 2129 PM  - Primary team notified?  Yes  - Family Notified? Yes     Claudean Severance, MD   08/29/2017, 11:58 PM

## 2017-08-31 LAB — TYPE AND SCREEN
ABO/RH(D): B POS
ANTIBODY SCREEN: NEGATIVE
UNIT DIVISION: 0
Unit division: 0

## 2017-08-31 LAB — BPAM RBC
BLOOD PRODUCT EXPIRATION DATE: 201908012359
Blood Product Expiration Date: 201908062359
UNIT TYPE AND RH: 7300
Unit Type and Rh: 7300

## 2017-08-31 MED FILL — Medication: Qty: 2 | Status: AC

## 2017-09-04 LAB — CULTURE, BLOOD (ROUTINE X 2): Culture: NO GROWTH

## 2017-09-17 DEATH — deceased

## 2019-07-31 IMAGING — DX DG CHEST 1V PORT
1 series · 1 of 1 positions shown · non-contrast
Comparison: 05/22/2017 chest radiograph

CLINICAL DATA: 61 y/o  M; acute respiratory distress.

EXAM:
PORTABLE CHEST 1 VIEW

[chest ap]
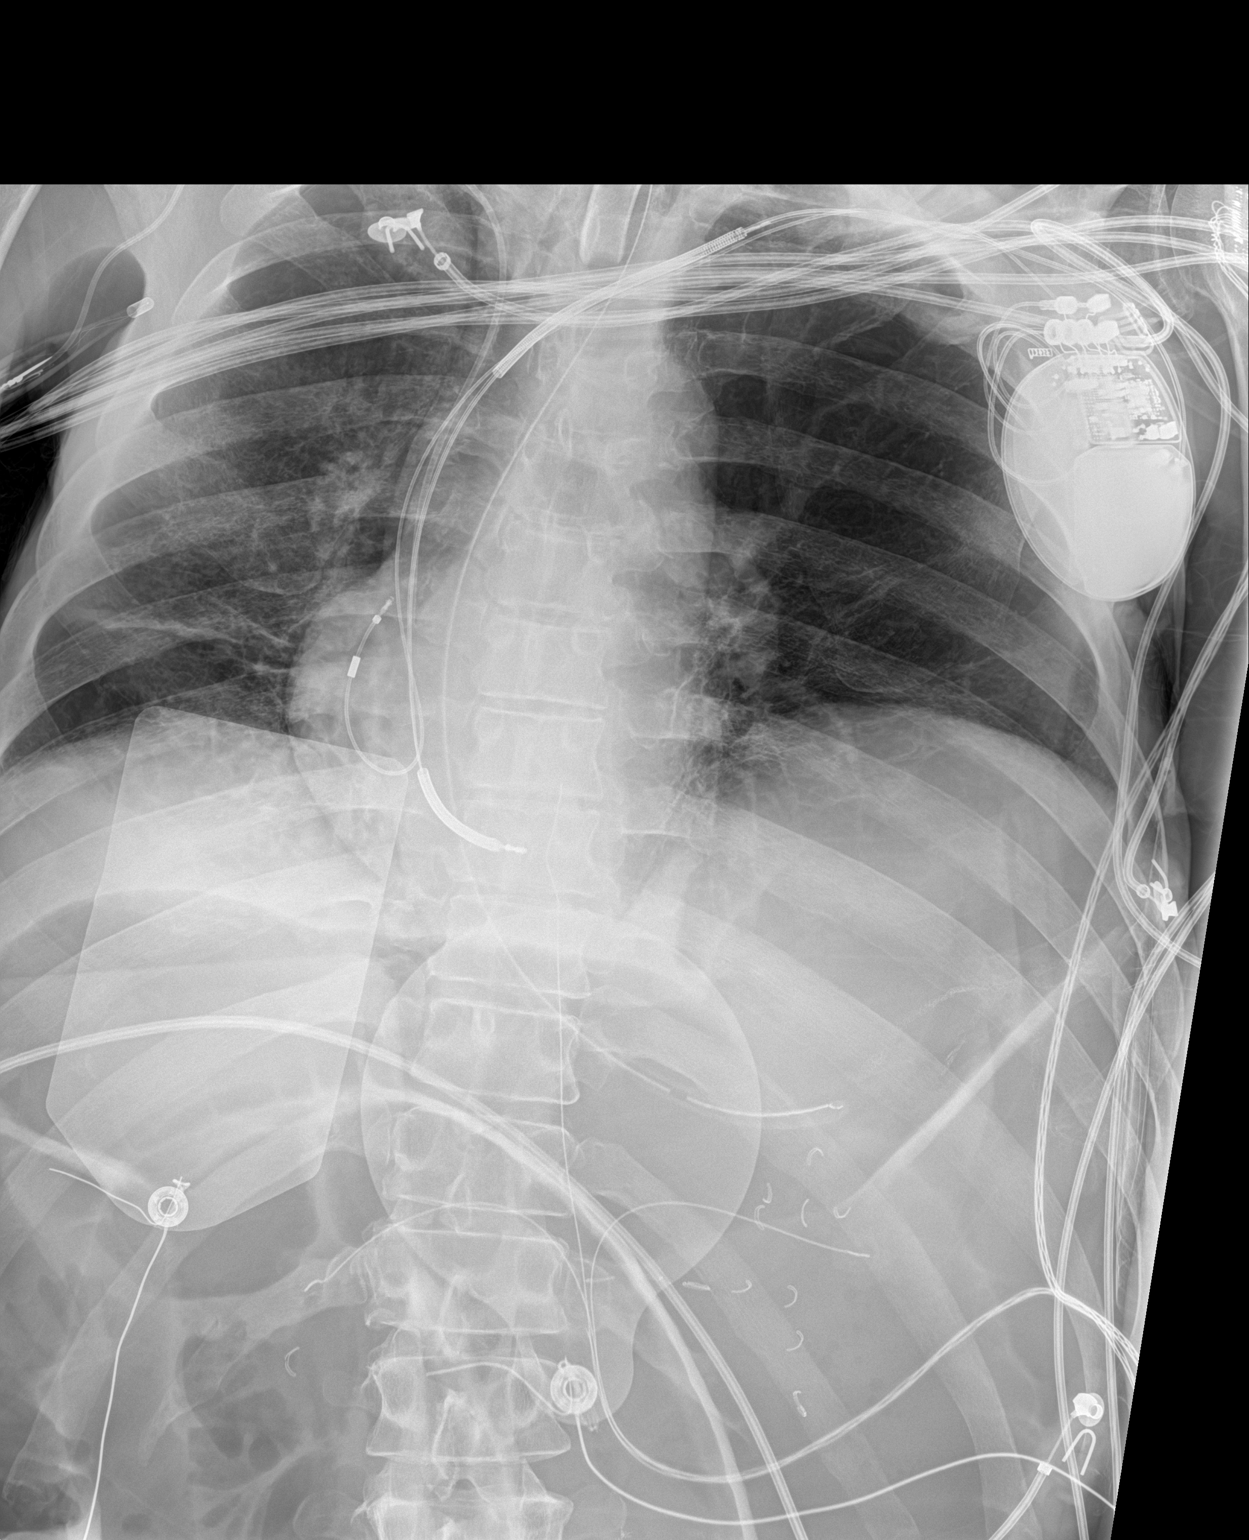

[1 of 1 positions shown; findings below may reference images not displayed]

FINDINGS: Stable normal cardiac silhouette given projection and technique. 2
lead AICD. Tracheostomy tube noted. Enteric tube tip projects over
the gastric body. Right central venous catheter tip projects over
the mid SVC. Aeration of the right lung. No new consolidation.
Multiple surgical clips project over the abdomen and there are
transcutaneous pacing pads. Partially visualized distention of large
bowel in upper abdomen. Bones are unremarkable.
IMPRESSION: Improved aeration of the right lung. No new consolidation
identified. Stable lines and tubes. Persistent partially visualized
distention of bowel.

By: Salionga Rhall M.D.

## 2019-09-15 IMAGING — DX DG ABDOMEN 1V
1 series · 1 of 1 positions shown · non-contrast
Comparison: 07/06/2017 06/12/2017

CLINICAL DATA: Encounter for gastric tube placement

EXAM:
ABDOMEN - 1 VIEW

[abdomen kub]
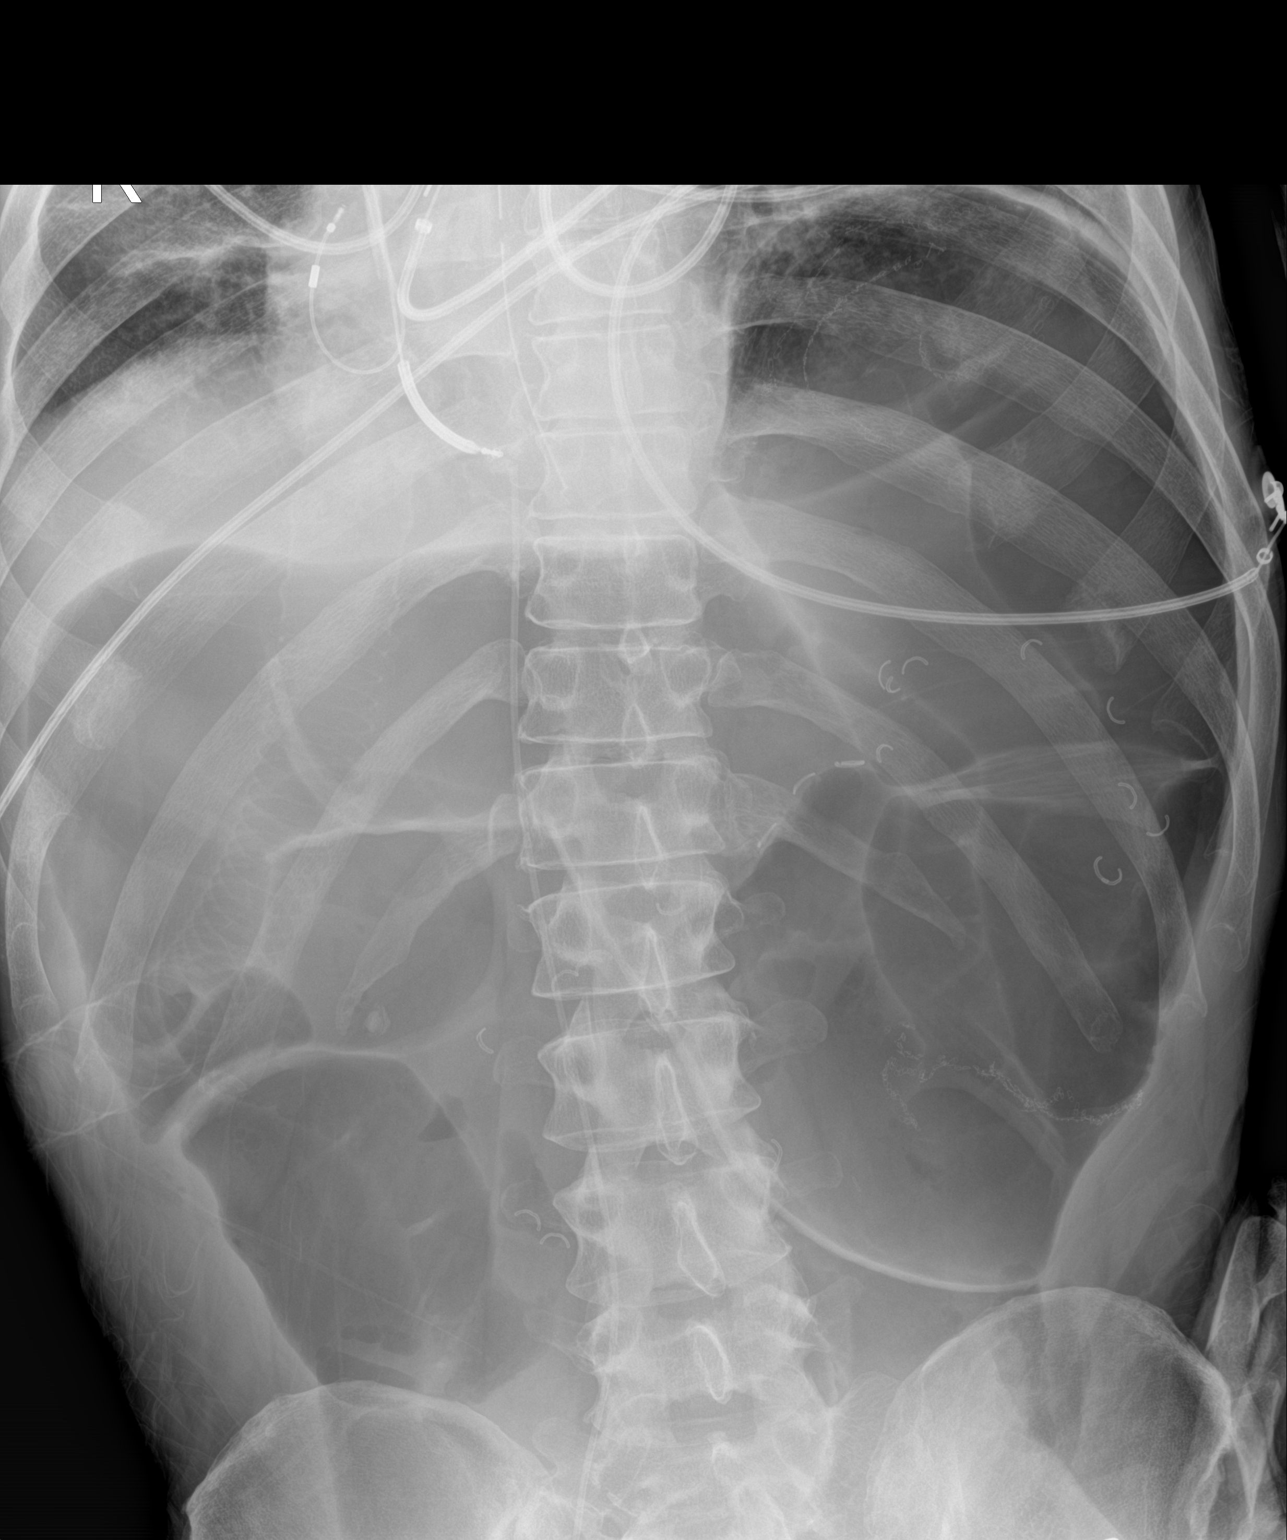

[1 of 1 positions shown; findings below may reference images not displayed]

FINDINGS: The tip and side port of a gastric tube are noted in the expected
location of the distal esophagus. Further advancement right 10-11 cm
is recommended. Marked dilatation of large bowel loops persists.
Chain sutures present in the left lower quadrant with scattered
suture material in the left hemiabdomen. Right renal calcification
as seen on prior CT is noted measuring 8 mm within the right
hemiabdomen. Inferior approach central line catheter terminates at
the cavoatrial junction. ICD leads are noted overlying the right
heart. Bibasilar atelectasis noted.
IMPRESSION: 1. The tip and side port of a gastric tube are seen in the expected
location of the distal esophagus. Further advancement by 10-11 cm is
recommended.
2. Right-sided nephrolithiasis measuring 8 mm.
3. Persistent marked dilatation of large bowel unchanged from prior.
4. Inferior approach central line catheter at the cavoatrial
junction.

## 2019-10-14 IMAGING — US IR FLUORO GUIDE CV LINE*L*
1 series · 2 of 2 positions shown · non-contrast
Comparison: Ultrasound fluoroscopic guided placement of a temporary
dialysis catheter - 06/02/2017

INDICATION: End-stage renal disease. In need temporary dialysis catheter
placement for the initiation of dialysis.

EXAM:
NON-TUNNELED CENTRAL VENOUS HEMODIALYSIS CATHETER PLACEMENT WITH
ULTRASOUND AND FLUOROSCOPIC GUIDANCE

[Series 1: ir fluoro guide cv line*left* · 2 of 2 slices shown]
[im 1/2]
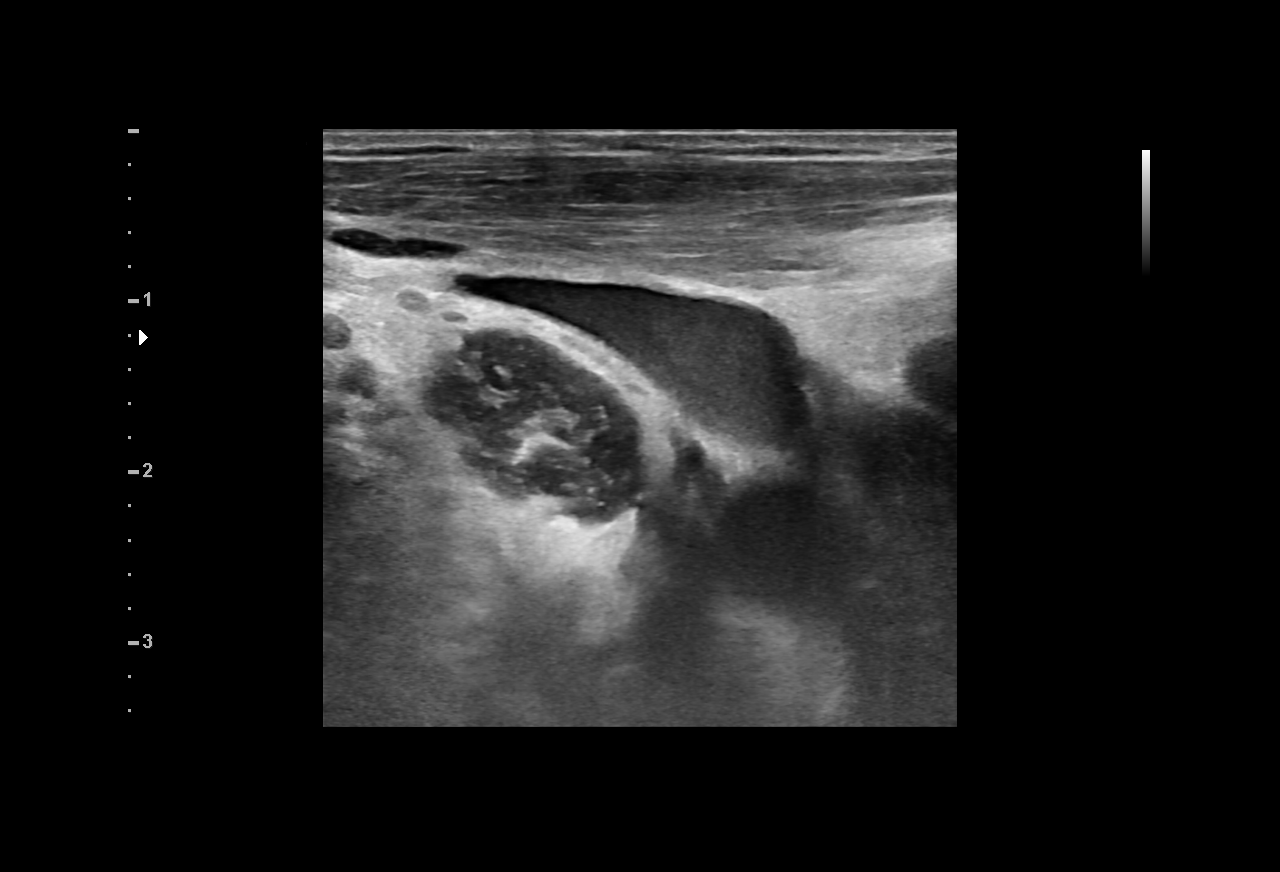
[im 2/2]
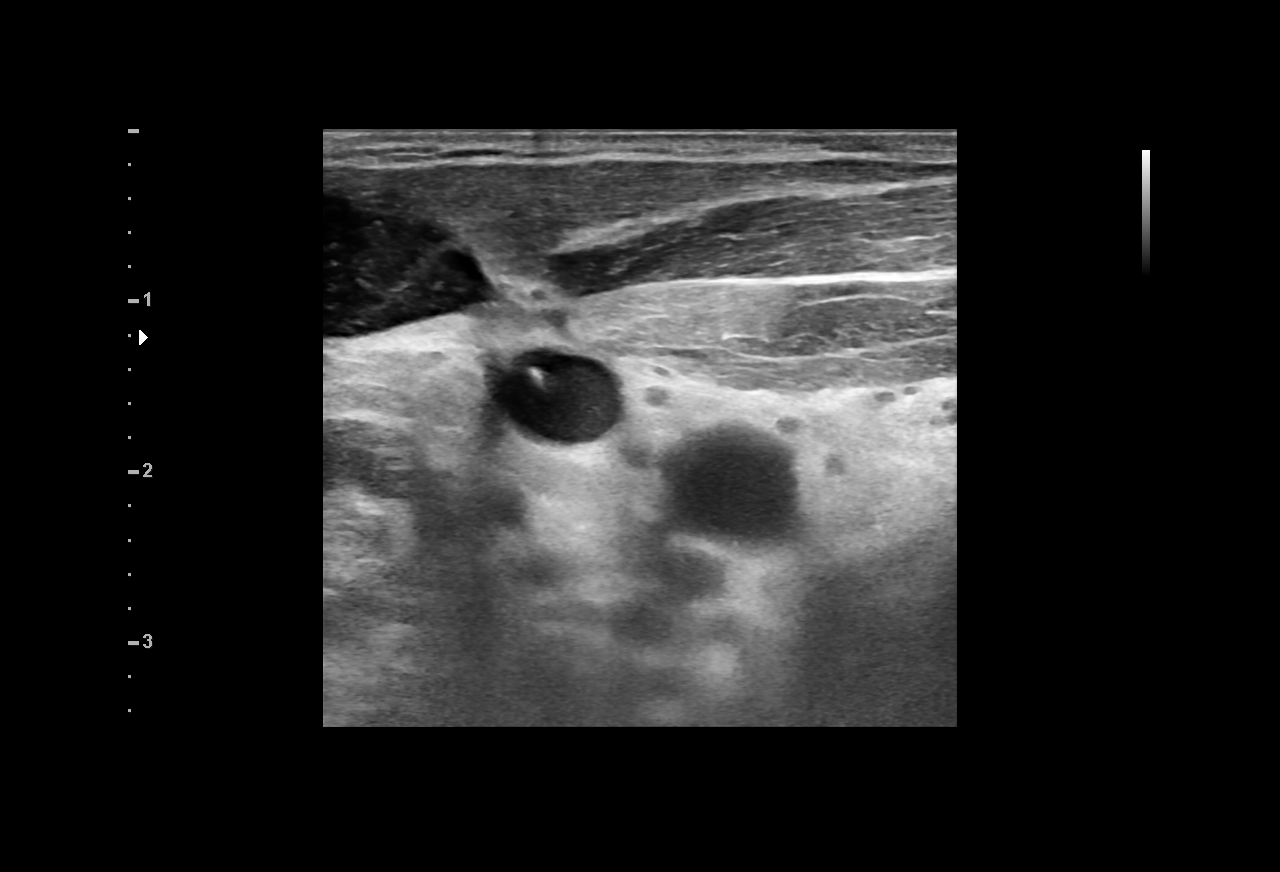

[2 of 2 positions shown; findings below may reference images not displayed]

MEDICATIONS:
None

FLUOROSCOPY TIME:  1 minute, 30 seconds (3.4  mGy)

COMPLICATIONS:
None immediate.

PROCEDURE:
Informed written consent was obtained from the patient's family
after a discussion of the risks, benefits, and alternatives to
treatment. Questions regarding the procedure were encouraged and
answered. Patient noted to have a severe contracture acute right
internal jugular approach dialysis catheter placement difficult. As
such, decision was made to place a left internal jugular vein
approach temporary dialysis catheter.

The left neck and chest were prepped with chlorhexidine in a sterile
fashion, and a sterile drape was applied covering the operative
field. Maximum barrier sterile technique with sterile gowns and
gloves were used for the procedure. A timeout was performed prior to
the initiation of the procedure.

After the overlying soft tissues were anesthetized, a small venotomy
incision was created and a micropuncture kit was utilized to access
the internal jugular vein. Real-time ultrasound guidance was
utilized for vascular access including the acquisition of a
permanent ultrasound image documenting patency of the accessed
vessel. The microwire was utilized to measure appropriate catheter
length.

A stiff glidewire was advanced to the level of the IVC. Under
fluoroscopic guidance, the venotomy was serially dilated, ultimately
allowing placement of a 24 cm temporary Trialysis catheter with tip
ultimately terminating within the superior aspect of the right
atrium. Final catheter positioning was confirmed and documented with
a spot radiographic image. The catheter aspirates and flushes
normally. The catheter was flushed with appropriate volume heparin
dwells.

The catheter exit site was secured with a 0-Prolene retention
suture. A dressing was placed. The patient tolerated the procedure
well without immediate post procedural complication.
IMPRESSION: Successful placement of a left internal jugular approach 24 cm
temporary dialysis catheter with tip terminating with in the
superior aspect of the right atrium. The catheter is ready for
immediate use.

PLAN:
This catheter may be converted to a tunneled dialysis catheter at a
later date as indicated, however note, attention will have to be
made to avoid the patient's existing left anterior chest wall AICD /
pacemaker power pack.
